# Patient Record
Sex: Female | Born: 1940 | Race: White | Hispanic: No | Marital: Single | Smoking: Never smoker
Health system: Southern US, Community
[De-identification: ages and names within clinical notes are randomized; demographics above are authoritative.]

## PROBLEM LIST (undated history)

## (undated) DIAGNOSIS — E785 Hyperlipidemia, unspecified: Secondary | ICD-10-CM

## (undated) DIAGNOSIS — I10 Essential (primary) hypertension: Secondary | ICD-10-CM

## (undated) DIAGNOSIS — M1990 Unspecified osteoarthritis, unspecified site: Secondary | ICD-10-CM

## (undated) HISTORY — PX: CARPAL TUNNEL RELEASE: SHX101

## (undated) HISTORY — PX: FOOT SURGERY: SHX648

## (undated) HISTORY — DX: Essential (primary) hypertension: I10

## (undated) HISTORY — DX: Hyperlipidemia, unspecified: E78.5

## (undated) HISTORY — DX: Unspecified osteoarthritis, unspecified site: M19.90

---

## 2008-12-31 ENCOUNTER — Ambulatory Visit: Payer: Self-pay | Admitting: Internal Medicine

## 2008-12-31 DIAGNOSIS — E785 Hyperlipidemia, unspecified: Secondary | ICD-10-CM | POA: Insufficient documentation

## 2008-12-31 DIAGNOSIS — I10 Essential (primary) hypertension: Secondary | ICD-10-CM | POA: Insufficient documentation

## 2008-12-31 DIAGNOSIS — J4531 Mild persistent asthma with (acute) exacerbation: Secondary | ICD-10-CM

## 2009-02-02 ENCOUNTER — Ambulatory Visit: Payer: Self-pay | Admitting: Internal Medicine

## 2009-06-03 ENCOUNTER — Ambulatory Visit: Payer: Self-pay | Admitting: Internal Medicine

## 2010-03-03 ENCOUNTER — Ambulatory Visit: Payer: Self-pay | Admitting: Internal Medicine

## 2010-06-07 NOTE — Assessment & Plan Note (Signed)
Summary: rov 4 months///kp   Copy to:  Monica [name] Primary Provider/Referring Provider:  Max Fickle  CC:  4 month followup.  Pt states she is doing great and denies any complaints today.Marland Kitchen  History of Present Illness: History of Present Illness: 12/31/08- 70 yoF seen at kind request of Dr Zachery Dauer in pulmonary sonsultation, concerned about asthma control. Never smoker, dx'd with asthma around 1989. She did well at that time, controlled by [place]. She was aware asthma was getting slowly worse. When Az[place]ort was no longer made, loss of control accelerated. Ventoling HFA didn't help. Advair 250/50 was the best replacement found for Az[place], but makes her hoarse. She has felt more short of breath with exertion and weather changes. Cough  noted, mostly dry, but little wheeze. Denies seasnoal rhinitis and not obviously bothered by foods. cosmetics, animals, grass or dust, aspirin , latex or contrast dye.. Denies hx of pneumnia, GERD, heart problems. Has had pneumovax in past.   02/02/09- Asthma Stable since last here. When she stopped Advair her hoarseness resolved. She is using Qvar, but needs script. CXR- reviewed- mild bronchitis airway thickening consistent with her asthma PFT- reviewed- mild obstruction small airways, with response to dilator.  June 03, 2009- Asthma No significant problems. She declines flu vax but has had pneumovax more than once. Hoarseness resolved off Advair and she thinks Qvar has done well. Hasn't needed her Ventolin rescue inhaler.  Resolved a right low lateral back pain. Denies leg pain or swelling.   Allergies: 1)  ! * Feldine  Past History:  Past Medical History: Last updated: 02/02/2009 Asthma-PFT 02/02/09- mild obst small airways, FEV1 119% Hyperlipidemia Hypertension Arthritis  Past Surgical History: Last updated: 12/31/2008 Carpal tunnel Foot surgery  Family History: Last updated: 12/31/2008 Father Lung Cancer  Social  History: Last updated: 12/31/2008 Retired Widowed with no children, lives alone Never Smoked ETOH none  Review of Systems      See HPI  The patient denies anorexia, fever, weight loss, weight gain, vision loss, decreased hearing, hoarseness, chest pain, syncope, dyspnea on exertion, peripheral edema, prolonged cough, headaches, hemoptysis, abdominal pain, and severe indigestion/heartburn.    Vital Signs:  Patient profile:   70 year old female Weight:      252 pounds O2 Sat:      97 % on Room air Pulse rate:   79 / minute BP sitting:   112 / 70  (left arm) Cuff size:   large  Vitals Entered By: Vernie Murders (June 03, 2009 1:37 PM)  O2 Flow:  Room air  Physical Exam  Additional Exam:  General: A/Ox3; pleasant and cooperative, NAD, obese, talkative SKIN: no rash, lesions NODES: no lymphadenopathy HEENT: [place]/AT, EOM- WNL, Conjuctivae- clear, PERRLA, TM-WNL, Nose- clear, Throat- clear and wnl, Melampatti III NECK: Supple w/ fair ROM, JVD- none, normal carotid impulses w/o bruits Thyroid-  CHEST: Clear to P&A HEART: RRR, no m/g/r heard ABDOMEN: Soft and nl;  QAS:TMHD, nl pulses, no edema  NEURO: Grossly intact to observation      Impression & Recommendations:  Problem # 1:  ASTHMA (ICD-493.90) Doing very well now. We will continue on Qvar with Ventolin rescue used appropriately.  Other Orders: Est. Patient Level III (62229)  Patient Instructions: 1)  Schedule return in one year, earlier if needed  2)  Continue Qvar 40 as your maintenance inhaler, using Ventolin as occasional "rescue"

## 2010-06-07 NOTE — Assessment & Plan Note (Signed)
Summary: rov- per armita-pt requested date//kp   Copy to:  Camillo Flaming Primary Provider/Referring Provider:  Max Fickle  CC:  Follow up visit-asthma; slight wheezing and SOB (feb and July have been the worst month).  History of Present Illness: 02/02/09- Asthma Stable since last here. When she stopped Advair her hoarseness resolved. She is using Qvar, but needs script. CXR- reviewed- mild bronchitis airway thickening consistent with her asthma PFT- reviewed- mild obstruction small airways, with response to dilator.  June 03, 2009- Asthma No significant problems. She declines flu vax but has had pneumovax more than once. Hoarseness resolved off Advair and she thinks Qvar has done well. Hasn't needed her Ventolin rescue inhaler.  Resolved a right low lateral back pain. Denies leg pain or swelling.   March 03, 2010- Asthma Nurse-CC: Follow up visit-asthma; slight wheezing and SOB (feb and July have been the worst month) Noted more wheeze last February and again in July and needed to use her rescue inhaler some then. Rarely needs rescue inhaler otherwise. Uses Qvar twice every day with no problems.  Reluctantly is deciding she should get a flu shot.     Asthma History    Initial Asthma Severity Rating:    Age range: 12+ years    Symptoms: 0-2 days/week    Nighttime Awakenings: 0-2/month    Interferes w/ normal activity: no limitations    SABA use (not for EIB): 0-2 days/week    Asthma Severity Assessment: Intermittent   Preventive Screening-Counseling & Management  Alcohol-Tobacco     Smoking Status: never  Current Medications (verified): 1)  Qvar 40 Mcg/act Aers (Beclomethasone Dipropionate) .... 2 Puffs and Rinse Two Times A Day 2)  Lovastatin 20 Mg Tabs (Lovastatin) .Marland Kitchen.. 1 Once Daily 3)  Sertraline Hcl 50 Mg Tabs (Sertraline Hcl) .Marland Kitchen.. 1 Once Daily 4)  Ventolin Hfa 108 (90 Base) Mcg/act Aers (Albuterol Sulfate) .Marland Kitchen.. 1-2 Puffs As Needed 5)   Hydrochlorothiazide 25 Mg Tabs (Hydrochlorothiazide) .Marland Kitchen.. 1 Once Daily 6)  Tylenol Arthritis Pain 650 Mg Cr-Tabs (Acetaminophen) .Marland Kitchen.. 1 Rablets 2- 3 Times A Day 7)  Spacer For Metered Inhaler- Aerochamber  Allergies (verified): 1)  ! * Feldine  Past History:  Past Medical History: Last updated: 02/02/2009 Asthma-PFT 02/02/09- mild obst small airways, FEV1 119% Hyperlipidemia Hypertension Arthritis  Past Surgical History: Last updated: 12/31/2008 Carpal tunnel Foot surgery  Family History: Last updated: 12/31/2008 Father Lung Cancer  Social History: Last updated: 12/31/2008 Retired Widowed with no children, lives alone Never Smoked ETOH none  Risk Factors: Smoking Status: never (03/03/2010)  Social History: Smoking Status:  never  Review of Systems      See HPI  The patient denies shortness of breath with activity, shortness of breath at rest, productive cough, non-productive cough, coughing up blood, chest pain, irregular heartbeats, acid heartburn, indigestion, loss of appetite, weight change, abdominal pain, difficulty swallowing, sore throat, tooth/dental problems, headaches, nasal congestion/difficulty breathing through nose, sneezing, itching, rash, change in color of mucus, and fever.    Vital Signs:  Patient profile:   70 year old female Height:      64 inches Weight:      247.13 pounds BMI:     42.57 O2 Sat:      93 % on Room air Pulse rate:   71 / minute BP sitting:   132 / 74  (left arm) Cuff size:   large  Vitals Entered By: Reynaldo Minium CMA (March 03, 2010 1:44 PM)  O2 Flow:  Room  air CC: Follow up visit-asthma; slight wheezing and SOB (feb and July have been the worst month)   Physical Exam  Additional Exam:  General: A/Ox3; pleasant and cooperative, NAD, obese, talkative SKIN: no rash, lesions NODES: no lymphadenopathy HEENT: [place]/AT, EOM- WNL, Conjuctivae- clear, PERRLA, TM-WNL, Nose- clear, Throat- clear and wnl, Mallampati III NECK:  Supple w/ fair ROM, JVD- none, normal carotid impulses w/o bruits Thyroid-  CHEST: Clear to P&A HEART: RRR, no m/g/r heard ABDOMEN: overweight UEA:VWUJ, nl pulses, no edema  NEURO: Grossly intact to observation      Impression & Recommendations:  Problem # 1:  ASTHMA (ICD-493.90) Good control with only occasional breakthrough which she manages appropriately with her rescue inhaler.  We discussed flu shot. She has decided to get it this year after all.   Problem # 2:  HYPERTENSION (ICD-401.9) Acceptable pressure range and not using meds likely to exacerbate her asthma. Her updated medication list for this problem includes:    Hydrochlorothiazide 25 Mg Tabs (Hydrochlorothiazide) .Marland Kitchen... 1 once daily  Other Orders: Est. Patient Level III (81191)  Patient Instructions: 1)  Please schedule a follow-up appointment in 1 year. 2)  Please call sooner if needed. 3)  Flu vax  Prevention & Chronic Care Immunizations   Influenza vaccine: Not documented    Tetanus booster: Not documented    Pneumococcal vaccine: Not documented    H. zoster vaccine: Not documented  Colorectal Screening   Hemoccult: Not documented    Colonoscopy: Not documented  Other Screening   Pap smear: Not documented    Mammogram: Not documented    DXA bone density scan: Not documented   Smoking status: never  (03/03/2010)  Lipids   Total Cholesterol: Not documented   LDL: Not documented   LDL Direct: Not documented   HDL: Not documented   Triglycerides: Not documented    SGOT (AST): Not documented   SGPT (ALT): Not documented   Alkaline phosphatase: Not documented   Total bilirubin: Not documented  Hypertension   Last Blood Pressure: 132 / 74  (03/03/2010)   Serum creatinine: Not documented   Serum potassium Not documented  Self-Management Support :    Hypertension self-management support: Not documented    Lipid self-management support: Not documented

## 2011-03-01 ENCOUNTER — Encounter: Payer: Self-pay | Admitting: Pulmonary Disease

## 2011-03-02 ENCOUNTER — Encounter: Payer: Self-pay | Admitting: Internal Medicine

## 2011-03-02 ENCOUNTER — Ambulatory Visit (INDEPENDENT_AMBULATORY_CARE_PROVIDER_SITE_OTHER): Payer: Medicare Other | Admitting: Internal Medicine

## 2011-03-02 VITALS — BP 124/70 | HR 85 | Ht 64.0 in | Wt 243.8 lb

## 2011-03-02 DIAGNOSIS — J45909 Unspecified asthma, uncomplicated: Secondary | ICD-10-CM

## 2011-03-02 MED ORDER — BECLOMETHASONE DIPROPIONATE 40 MCG/ACT IN AERS: 2.0000 | INHALATION_SPRAY | Freq: Two times a day (BID) | RESPIRATORY_TRACT | Status: DC

## 2011-03-02 MED ORDER — ALBUTEROL SULFATE HFA 108 (90 BASE) MCG/ACT IN AERS: 2.0000 | INHALATION_SPRAY | RESPIRATORY_TRACT | Status: DC | PRN

## 2011-03-02 MED ORDER — MONTELUKAST SODIUM 10 MG PO TABS: 10.0000 mg | ORAL_TABLET | Freq: Every day | ORAL | Status: DC

## 2011-03-02 NOTE — Patient Instructions (Signed)
Added script for Singulair/ montelukast sent  Refills for regular meds sent  Flu vax

## 2011-03-02 NOTE — Progress Notes (Signed)
03/02/11- 70 year old female never smoker followed for asthma. Last here 03/03/2010 She says medications are fine. Using an AeroChamber. Wheezing more often and noting more shortness of breath with exertion and during weather change. Using her rescue inhaler a few times a month. Gets hoarse easily with prolonged talking and finding it more difficult to sing. Admits being depressed by her age. Says Advair did not help in the past on limited trial. PFT 02/02/09  ROS-see HPI  Constitutional:   No-   weight loss, night sweats, fevers, chills, fatigue, lassitude. HEENT:   No-  headaches, difficulty swallowing, tooth/dental problems, sore throat,       No-  sneezing, itching, ear ache, nasal congestion, post nasal drip,  CV:  No-   chest pain, orthopnea, PND, swelling in lower extremities, anasarca, dizziness, palpitations Resp: + shortness of breath with exertion or at rest.              No-   productive cough,  No non-productive cough,  No- coughing up of blood.              No-   change in color of mucus.  No- wheezing.   Skin: No-   rash or lesions. GI:  No-   heartburn, indigestion, abdominal pain, nausea, vomiting, diarrhea,                 change in bowel habits, loss of appetite GU: No-   dysuria, change in color of urine, no urgency or frequency.  No- flank pain. MS:  No-   joint pain or swelling.  No- decreased range of motion.  No- back pain. Neuro-     nothing unusual Psych:  No- change in mood or affect. No depression or anxiety.  No memory loss.  OBJ General- Alert, Oriented, Affect-appropriate, Distress- none acute. Very obese but weight is down 4 pounds compared with last visit. Skin- rash-none, lesions- none, excoriation- none Lymphadenopathy- none Head- atraumatic            Eyes- Gross vision intact, PERRLA, conjunctivae clear secretions            Ears- Hearing, canals-normal            Nose- Clear, no-Septal dev, mucus, polyps, erosion, perforation             Throat-  Mallampati III , mucosa clear , drainage- none, tonsils- atrophic Neck- flexible , trachea midline, no stridor , thyroid nl, carotid no bruit Chest - symmetrical excursion , unlabored           Heart/CV- RRR , no murmur , no gallop  , no rub, nl s1 s2                           - JVD- none , edema- none, stasis changes- none, varices- none           Lung- clear to P&A, wheeze- none, cough- none , dullness-none, rub- none           Chest wall-  Abd- tender-no, distended-no, bowel sounds-present, HSM- no Br/ Gen/ Rectal- Not done, not indicated Extrem- cyanosis- none, clubbing, none, atrophy- none, strength- nl Neuro- grossly intact to observation

## 2011-03-05 NOTE — Assessment & Plan Note (Signed)
Dyspnea on exertion would be entirely consistent with her body habitus and deconditioning. We discussed additional protection against asthma and we'll see what effect Singulair has. Flu vaccine.

## 2011-04-04 ENCOUNTER — Encounter: Payer: Self-pay | Admitting: Internal Medicine

## 2011-04-04 ENCOUNTER — Ambulatory Visit (INDEPENDENT_AMBULATORY_CARE_PROVIDER_SITE_OTHER): Payer: Medicare Other | Admitting: Internal Medicine

## 2011-04-04 VITALS — BP 120/64 | HR 80 | Ht 64.0 in | Wt 242.6 lb

## 2011-04-04 DIAGNOSIS — J45909 Unspecified asthma, uncomplicated: Secondary | ICD-10-CM

## 2011-04-04 NOTE — Progress Notes (Signed)
03/02/11- 70 year old female never smoker followed for asthma. Last here 03/03/2010 She says medications are fine. Using an AeroChamber. Wheezing more often and noting more shortness of breath with exertion and during weather change. Using her rescue inhaler a few times a month. Gets hoarse easily with prolonged talking and finding it more difficult to sing. Admits being depressed by her age. Says Advair did not help in the past on limited trial. PFT 02/02/09  04/04/11- 69 year old female never smoker followed for asthma. Has had flu vaccine. Saw no difference adding Singulair. Weather changes are her biggest trigger. Got air purifier for her home. Not needing rescue inhaler at all now. Overall better in the past month. Qvar does help. Recent sore throat has come and gone some over the last 2 weeks without progressing.  ROS-see HPI  Constitutional:   No-   weight loss, night sweats, fevers, chills, fatigue, lassitude. HEENT:   No-  headaches, difficulty swallowing, tooth/dental problems, +sore throat,       No-  sneezing, itching, ear ache, nasal congestion, post nasal drip,  CV:  No-   chest pain, orthopnea, PND, swelling in lower extremities, anasarca, dizziness, palpitations Resp: + shortness of breath with exertion or at rest.              No-   productive cough,  No non-productive cough,  No- coughing up of blood.              No-   change in color of mucus.  No- wheezing.   Skin: No-   rash or lesions. GI:  No-   heartburn, indigestion, abdominal pain, nausea, vomiting, diarrhea,                 change in bowel habits, loss of appetite GU: No-   dysuria, change in color of urine, no urgency or frequency.  No- flank pain. MS:  No-   joint pain or swelling.  No- decreased range of motion.  No- back pain. Neuro-     nothing unusual Psych:  No- change in mood or affect. No depression or anxiety.  No memory loss.  OBJ General- Alert, Oriented, Affect-appropriate, Distress- none acute.  Obese Skin- rash-none, lesions- none, excoriation- none Lymphadenopathy- none Head- atraumatic            Eyes- Gross vision intact, PERRLA, conjunctivae clear secretions            Ears- Hearing, canals-normal            Nose- Clear, no-Septal dev, mucus, polyps, erosion, perforation             Throat- Mallampati III , mucosa clear , drainage- none, tonsils- atrophic. Easy gag. Neck- flexible , trachea midline, no stridor , thyroid nl, carotid no bruit Chest - symmetrical excursion , unlabored           Heart/CV- RRR , no murmur , no gallop  , no rub, nl s1 s2                           - JVD- none , edema- none, stasis changes- none, varices- none           Lung- clear to P&A, wheeze- none, cough- none , dullness-none, rub- none           Chest wall-  Abd- tender-no, distended-no, bowel sounds-present, HSM- no Br/ Gen/ Rectal- Not done, not indicated Extrem- cyanosis- none, clubbing, none, atrophy-  none, strength- nl Neuro- grossly intact to observation

## 2011-04-04 NOTE — Patient Instructions (Addendum)
Ok now to stop Singulair. We can revisit later if needed.  Please call as needed

## 2011-04-06 NOTE — Assessment & Plan Note (Signed)
Asthma is under good control without Singulair. Sore throat is minimal and nonspecific with nothing really seen on exam. We discussed her medications and expectations for winter.

## 2011-06-07 DIAGNOSIS — E7800 Pure hypercholesterolemia, unspecified: Secondary | ICD-10-CM | POA: Diagnosis not present

## 2011-06-07 DIAGNOSIS — F329 Major depressive disorder, single episode, unspecified: Secondary | ICD-10-CM | POA: Diagnosis not present

## 2011-06-07 DIAGNOSIS — Z79899 Other long term (current) drug therapy: Secondary | ICD-10-CM | POA: Diagnosis not present

## 2011-06-07 DIAGNOSIS — I10 Essential (primary) hypertension: Secondary | ICD-10-CM | POA: Diagnosis not present

## 2011-06-07 DIAGNOSIS — R7301 Impaired fasting glucose: Secondary | ICD-10-CM | POA: Diagnosis not present

## 2011-08-17 DIAGNOSIS — H04129 Dry eye syndrome of unspecified lacrimal gland: Secondary | ICD-10-CM | POA: Diagnosis not present

## 2011-08-17 DIAGNOSIS — H2510 Age-related nuclear cataract, unspecified eye: Secondary | ICD-10-CM | POA: Diagnosis not present

## 2011-08-17 DIAGNOSIS — H52209 Unspecified astigmatism, unspecified eye: Secondary | ICD-10-CM | POA: Diagnosis not present

## 2011-08-17 DIAGNOSIS — H01009 Unspecified blepharitis unspecified eye, unspecified eyelid: Secondary | ICD-10-CM | POA: Diagnosis not present

## 2011-09-26 ENCOUNTER — Ambulatory Visit: Payer: Medicare Other | Admitting: Internal Medicine

## 2011-09-28 ENCOUNTER — Ambulatory Visit (INDEPENDENT_AMBULATORY_CARE_PROVIDER_SITE_OTHER): Payer: Medicare Other | Admitting: Internal Medicine

## 2011-09-28 ENCOUNTER — Encounter: Payer: Self-pay | Admitting: Internal Medicine

## 2011-09-28 VITALS — BP 136/82 | HR 95 | Ht 64.0 in | Wt 237.6 lb

## 2011-09-28 DIAGNOSIS — J45998 Other asthma: Secondary | ICD-10-CM

## 2011-09-28 DIAGNOSIS — J45909 Unspecified asthma, uncomplicated: Secondary | ICD-10-CM | POA: Diagnosis not present

## 2011-09-28 DIAGNOSIS — J302 Other seasonal allergic rhinitis: Secondary | ICD-10-CM

## 2011-09-28 DIAGNOSIS — J309 Allergic rhinitis, unspecified: Secondary | ICD-10-CM | POA: Diagnosis not present

## 2011-09-28 NOTE — Progress Notes (Signed)
03/02/11- 71 year old female never smoker followed for asthma. Last here 03/03/2010 She says medications are fine. Using an AeroChamber. Wheezing more often and noting more shortness of breath with exertion and during weather change. Using her rescue inhaler a few times a month. Gets hoarse easily with prolonged talking and finding it more difficult to sing. Admits being depressed by her age. Says Advair did not help in the past on limited trial. PFT 02/02/09  04/04/11- 71 year old female never smoker followed for asthma. Has had flu vaccine. Saw no difference adding Singulair. Weather changes are her biggest trigger. Got air purifier for her home. Not needing rescue inhaler at all now. Overall better in the past month. Qvar does help. Recent sore throat has come and gone some over the last 2 weeks without progressing.  09/28/11- 71 year old female never smoker followed for asthma. PCP Dr Gardiner Ramus  Denies any SOB, wheezing, cough, or congestion at this time She had a good winter after her last visit here. 2 the spring has had minor sneezing blamed on pollen. She is very pleased and considers her breathing to do well. Rarely needs a rescue inhaler. Continues Qvar 40 twice daily as instructed. She did not want to try reducing the dose for fear of aggravating her condition.  ROS-see HPI  Constitutional:   No-   weight loss, night sweats, fevers, chills, fatigue, lassitude. HEENT:   No-  headaches, difficulty swallowing, tooth/dental problems, +sore throat,       No-  sneezing, itching, ear ache, nasal congestion, post nasal drip,  CV:  No-   chest pain, orthopnea, PND, swelling in lower extremities, anasarca, dizziness, palpitations Resp:   Little shortness of breath with exertion or at rest.              No-   productive cough,  No non-productive cough,  No- coughing up of blood.              No-   change in color of mucus.  No- wheezing.   Skin: No-   rash or lesions. GI:  No-   heartburn,  indigestion, abdominal pain, nausea, vomiting,  GU:  MS:  No-   joint pain or swelling.   Neuro-     nothing unusual Psych:  No- change in mood or affect. No depression or anxiety.  No memory loss.  OBJ General- Alert, Oriented, Affect-appropriate, Distress- none acute. Obese Skin- rash-none, lesions- none, excoriation- none Lymphadenopathy- none Head- atraumatic            Eyes- Gross vision intact, PERRLA, conjunctivae clear secretions            Ears- Hearing, canals-normal            Nose- Clear, no-Septal dev, mucus, polyps, erosion, perforation             Throat- Mallampati III , mucosa clear , drainage- none, tonsils- atrophic. Easy gag. Neck- flexible , trachea midline, no stridor , thyroid nl, carotid no bruit Chest - symmetrical excursion , unlabored           Heart/CV- RRR , no murmur , no gallop  , no rub, nl s1 s2                           - JVD- none , edema- none, stasis changes- none, varices- none           Lung- clear to P&A, wheeze- none, cough- none ,  dullness-none, rub- none           Chest wall-  Abd-  Br/ Gen/ Rectal- Not done, not indicated Extrem- cyanosis- none, clubbing, none, atrophy- none, strength- nl Neuro- grossly intact to observation

## 2011-09-28 NOTE — Patient Instructions (Signed)
Please call as needed. I'm glad you have been doing so well.

## 2011-10-03 DIAGNOSIS — J302 Other seasonal allergic rhinitis: Secondary | ICD-10-CM | POA: Insufficient documentation

## 2011-10-03 NOTE — Assessment & Plan Note (Signed)
Very good control with maintenance Qvar. She is reluctant to risk exacerbation by trying to reduce this dose.

## 2011-10-03 NOTE — Assessment & Plan Note (Signed)
Plan-options and common triggers were discussed. She will use an OTC antihistamine initially and we can reevaluate her in the future.

## 2011-11-02 DIAGNOSIS — I10 Essential (primary) hypertension: Secondary | ICD-10-CM | POA: Diagnosis not present

## 2011-11-02 DIAGNOSIS — E7800 Pure hypercholesterolemia, unspecified: Secondary | ICD-10-CM | POA: Diagnosis not present

## 2011-11-02 DIAGNOSIS — F329 Major depressive disorder, single episode, unspecified: Secondary | ICD-10-CM | POA: Diagnosis not present

## 2011-11-02 DIAGNOSIS — R7301 Impaired fasting glucose: Secondary | ICD-10-CM | POA: Diagnosis not present

## 2011-11-02 DIAGNOSIS — Z79899 Other long term (current) drug therapy: Secondary | ICD-10-CM | POA: Diagnosis not present

## 2012-02-29 DIAGNOSIS — Z23 Encounter for immunization: Secondary | ICD-10-CM | POA: Diagnosis not present

## 2012-03-11 ENCOUNTER — Other Ambulatory Visit: Payer: Self-pay | Admitting: Internal Medicine

## 2012-03-26 ENCOUNTER — Encounter: Payer: Self-pay | Admitting: Internal Medicine

## 2012-03-26 ENCOUNTER — Ambulatory Visit (INDEPENDENT_AMBULATORY_CARE_PROVIDER_SITE_OTHER): Payer: Medicare Other | Admitting: Internal Medicine

## 2012-03-26 VITALS — BP 130/80 | HR 79 | Ht 64.0 in | Wt 238.6 lb

## 2012-03-26 DIAGNOSIS — J309 Allergic rhinitis, unspecified: Secondary | ICD-10-CM

## 2012-03-26 DIAGNOSIS — J45998 Other asthma: Secondary | ICD-10-CM

## 2012-03-26 DIAGNOSIS — J45909 Unspecified asthma, uncomplicated: Secondary | ICD-10-CM | POA: Diagnosis not present

## 2012-03-26 DIAGNOSIS — J302 Other seasonal allergic rhinitis: Secondary | ICD-10-CM

## 2012-03-26 NOTE — Patient Instructions (Addendum)
Please call as needed 

## 2012-03-26 NOTE — Progress Notes (Signed)
03/02/11- 71 year old female never smoker followed for asthma. Last here 03/03/2010 She says medications are fine. Using an AeroChamber. Wheezing more often and noting more shortness of breath with exertion and during weather change. Using her rescue inhaler a few times a month. Gets hoarse easily with prolonged talking and finding it more difficult to sing. Admits being depressed by her age. Says Advair did not help in the past on limited trial. PFT 02/02/09  04/04/11- 71 year old female never smoker followed for asthma. Has had flu vaccine. Saw no difference adding Singulair. Weather changes are her biggest trigger. Got air purifier for her home. Not needing rescue inhaler at all now. Overall better in the past month. Qvar does help. Recent sore throat has come and gone some over the last 2 weeks without progressing.  09/28/11- 71 year old female never smoker followed for asthma. PCP Dr Gardiner Ramus  Denies any SOB, wheezing, cough, or congestion at this time She had a good winter after her last visit here.  Through the spring has had minor sneezing blamed on pollen. She is very pleased and considers her breathing to do well. Rarely needs a rescue inhaler. Continues Qvar 40 twice daily as instructed. She did not want to try reducing the dose for fear of aggravating her condition.  03/26/12- 71 year old female never smoker followed for asthma, allergic rhinitis. PCP Dr Gardiner Ramus FOLLOWS FOR: patient reports breathing is doing well, denies any other concerns at this time  Had flu vaccine. Maintenance therapy is Qvar 40 with rare need for rescue inhaler. She is satisfied. CXR 12/31/08-reviewed IMPRESSION:  Mild bronchitic changes.  Provider: Darrelyn Hillock   ROS-see HPI  Constitutional:   No-   weight loss, night sweats, fevers, chills, fatigue, lassitude. HEENT:   No-  headaches, difficulty swallowing, tooth/dental problems, +sore throat,       No-  sneezing, itching, ear ache, nasal  congestion, post nasal drip,  CV:  No-   chest pain, orthopnea, PND, swelling in lower extremities, anasarca, dizziness, palpitations Resp:   No- shortness of breath with exertion or at rest.              No-   productive cough,  No non-productive cough,  No- coughing up of blood.              No-   change in color of mucus.  No- wheezing.   Skin: No-   rash or lesions. GI:  No-   heartburn, indigestion, abdominal pain, nausea, vomiting,  GU:  MS:  No-   joint pain or swelling.   Neuro-     nothing unusual Psych:  No- change in mood or affect. No depression or anxiety.  No memory loss.  OBJ General- Alert, Oriented, Affect-appropriate, Distress- none acute. Obese Skin- rash-none, lesions- none, excoriation- none Lymphadenopathy- none Head- atraumatic            Eyes- Gross vision intact, PERRLA, conjunctivae clear secretions            Ears- Hearing, canals-normal            Nose- Clear, no-Septal dev, mucus, polyps, erosion, perforation             Throat- Mallampati III , mucosa clear , drainage- none, tonsils- atrophic. Easy gag. Neck- flexible , trachea midline, no stridor , thyroid nl, carotid no bruit Chest - symmetrical excursion , unlabored           Heart/CV- RRR , no murmur , no gallop  ,  no rub, nl s1 s2                           - JVD- none , edema- none, stasis changes- none, varices- none           Lung- clear to P&A, wheeze- none, cough- none , dullness-none, rub- none           Chest wall-  Abd-  Br/ Gen/ Rectal- Not done, not indicated Extrem- cyanosis- none, clubbing, none, atrophy- none, strength- nl Neuro- grossly intact to observation

## 2012-04-05 NOTE — Assessment & Plan Note (Signed)
Very good control. She is compliant with maintenance steroid inhaler therapy. We discussed long-term use.

## 2012-04-05 NOTE — Assessment & Plan Note (Signed)
Not bothered currently by indoor heat or fall season. Management is active. We will see what happens as spring comes.

## 2012-04-24 DIAGNOSIS — R7301 Impaired fasting glucose: Secondary | ICD-10-CM | POA: Diagnosis not present

## 2012-04-24 DIAGNOSIS — Z1331 Encounter for screening for depression: Secondary | ICD-10-CM | POA: Diagnosis not present

## 2012-04-24 DIAGNOSIS — M79609 Pain in unspecified limb: Secondary | ICD-10-CM | POA: Diagnosis not present

## 2012-04-24 DIAGNOSIS — I10 Essential (primary) hypertension: Secondary | ICD-10-CM | POA: Diagnosis not present

## 2012-04-24 DIAGNOSIS — Z23 Encounter for immunization: Secondary | ICD-10-CM | POA: Diagnosis not present

## 2012-04-24 DIAGNOSIS — Z79899 Other long term (current) drug therapy: Secondary | ICD-10-CM | POA: Diagnosis not present

## 2012-04-24 DIAGNOSIS — E7800 Pure hypercholesterolemia, unspecified: Secondary | ICD-10-CM | POA: Diagnosis not present

## 2012-04-24 DIAGNOSIS — F329 Major depressive disorder, single episode, unspecified: Secondary | ICD-10-CM | POA: Diagnosis not present

## 2012-08-20 DIAGNOSIS — H04129 Dry eye syndrome of unspecified lacrimal gland: Secondary | ICD-10-CM | POA: Diagnosis not present

## 2012-08-20 DIAGNOSIS — H2510 Age-related nuclear cataract, unspecified eye: Secondary | ICD-10-CM | POA: Diagnosis not present

## 2012-08-20 DIAGNOSIS — H01009 Unspecified blepharitis unspecified eye, unspecified eyelid: Secondary | ICD-10-CM | POA: Diagnosis not present

## 2012-08-20 DIAGNOSIS — H524 Presbyopia: Secondary | ICD-10-CM | POA: Diagnosis not present

## 2012-10-16 DIAGNOSIS — F411 Generalized anxiety disorder: Secondary | ICD-10-CM | POA: Diagnosis not present

## 2012-10-16 DIAGNOSIS — F4321 Adjustment disorder with depressed mood: Secondary | ICD-10-CM | POA: Diagnosis not present

## 2012-10-16 DIAGNOSIS — F329 Major depressive disorder, single episode, unspecified: Secondary | ICD-10-CM | POA: Diagnosis not present

## 2012-10-16 DIAGNOSIS — Z79899 Other long term (current) drug therapy: Secondary | ICD-10-CM | POA: Diagnosis not present

## 2012-10-16 DIAGNOSIS — I10 Essential (primary) hypertension: Secondary | ICD-10-CM | POA: Diagnosis not present

## 2012-10-16 DIAGNOSIS — R7301 Impaired fasting glucose: Secondary | ICD-10-CM | POA: Diagnosis not present

## 2012-10-16 DIAGNOSIS — E7800 Pure hypercholesterolemia, unspecified: Secondary | ICD-10-CM | POA: Diagnosis not present

## 2012-10-16 DIAGNOSIS — R7989 Other specified abnormal findings of blood chemistry: Secondary | ICD-10-CM | POA: Diagnosis not present

## 2013-02-25 ENCOUNTER — Ambulatory Visit (INDEPENDENT_AMBULATORY_CARE_PROVIDER_SITE_OTHER): Payer: Medicare Other | Admitting: Internal Medicine

## 2013-02-25 ENCOUNTER — Encounter: Payer: Self-pay | Admitting: Internal Medicine

## 2013-02-25 VITALS — BP 130/80 | HR 79 | Ht 64.0 in | Wt 244.0 lb

## 2013-02-25 DIAGNOSIS — J45909 Unspecified asthma, uncomplicated: Secondary | ICD-10-CM

## 2013-02-25 DIAGNOSIS — Z23 Encounter for immunization: Secondary | ICD-10-CM | POA: Diagnosis not present

## 2013-02-25 DIAGNOSIS — J45998 Other asthma: Secondary | ICD-10-CM

## 2013-02-25 NOTE — Patient Instructions (Addendum)
Flu vax- standard  We can continue present meds. Please call for refills or problems as needed  Pneumococcal conjugate 13 vax

## 2013-02-25 NOTE — Progress Notes (Signed)
03/02/11- 72 year old female never smoker followed for asthma. Last here 03/03/2010 She says medications are fine. Using an AeroChamber. Wheezing more often and noting more shortness of breath with exertion and during weather change. Using her rescue inhaler a few times a month. Gets hoarse easily with prolonged talking and finding it more difficult to sing. Admits being depressed by her age. Says Advair did not help in the past on limited trial. PFT 02/02/09  04/04/11- 72 year old female never smoker followed for asthma. Has had flu vaccine. Saw no difference adding Singulair. Weather changes are her biggest trigger. Got air purifier for her home. Not needing rescue inhaler at all now. Overall better in the past month. Qvar does help. Recent sore throat has come and gone some over the last 2 weeks without progressing.  09/28/11- 72 year old female never smoker followed for asthma. PCP Dr Gardiner Ramus  Denies any SOB, wheezing, cough, or congestion at this time She had a good winter after her last visit here.  Through the spring has had minor sneezing blamed on pollen. She is very pleased and considers her breathing to do well. Rarely needs a rescue inhaler. Continues Qvar 40 twice daily as instructed. She did not want to try reducing the dose for fear of aggravating her condition.  03/26/12- 72 year old female never smoker followed for asthma, allergic rhinitis. PCP Dr Gardiner Ramus FOLLOWS FOR: patient reports breathing is doing well, denies any other concerns at this time  Had flu vaccine. Maintenance therapy is Qvar 40 with rare need for rescue inhaler. She is satisfied. CXR 12/31/08-reviewed IMPRESSION:  Mild bronchitic changes.  Provider: Darrelyn Hillock   02/25/13- 72 year old female never smoker followed for asthma, allergic rhinitis. PCP Dr Gardiner Ramus FOLLOWS FOR:  Breathing is well, some SOB w/ exertion.  Denies any concerns today Dyspnea only if she hurries. Infrequent wheeze. Morning  cough. Qvar usually sufficient.   ROS-see HPI  Constitutional:   No-   weight loss, night sweats, fevers, chills, fatigue, lassitude. HEENT:   No-  headaches, difficulty swallowing, tooth/dental problems, sore throat,       No-  sneezing, itching, ear ache, nasal congestion, post nasal drip,  CV:  No-   chest pain, orthopnea, PND, swelling in lower extremities, anasarca, dizziness, palpitations Resp:   No- shortness of breath with exertion or at rest.              No-   productive cough,  + non-productive cough,  No- coughing up of blood.              No-   change in color of mucus.  +rare wheezing.   Skin: No-   rash or lesions. GI:  No-   heartburn, indigestion, abdominal pain, nausea, vomiting,  GU:  MS:  No-   joint pain or swelling.   Neuro-     nothing unusual Psych:  No- change in mood or affect. No depression or anxiety.  No memory loss.  OBJ General- Alert, Oriented, Affect-appropriate, Distress- none acute. Obese Skin- rash-none, lesions- none, excoriation- none Lymphadenopathy- none Head- atraumatic            Eyes- Gross vision intact, PERRLA, conjunctivae clear secretions            Ears- Hearing, canals-normal            Nose- Clear, no-Septal dev, mucus, polyps, erosion, perforation             Throat- Mallampati III , mucosa clear , drainage-  none, tonsils- atrophic. +Easy gag. Neck- flexible , trachea midline, no stridor , thyroid nl, carotid no bruit Chest - symmetrical excursion , unlabored           Heart/CV- RRR , no murmur , no gallop  , no rub, nl s1 s2                           - JVD- none , edema- none, stasis changes- none, varices- none           Lung- clear to P&A, wheeze- none, cough- none , dullness-none, rub- none           Chest wall-  Abd-  Br/ Gen/ Rectal- Not done, not indicated Extrem- cyanosis- none, clubbing, none, atrophy- none, strength- nl Neuro- grossly intact to observation

## 2013-03-14 NOTE — Assessment & Plan Note (Addendum)
Good control. Discussed vaccines Decision to give both flu and Prevnar pneumococcal conj 13

## 2013-04-08 ENCOUNTER — Other Ambulatory Visit: Payer: Self-pay | Admitting: Internal Medicine

## 2013-04-08 ENCOUNTER — Other Ambulatory Visit: Payer: Self-pay | Admitting: *Deleted

## 2013-04-08 MED ORDER — BECLOMETHASONE DIPROPIONATE 40 MCG/ACT IN AERS: INHALATION_SPRAY | RESPIRATORY_TRACT | Status: DC

## 2013-04-08 NOTE — Telephone Encounter (Signed)
rx was sent to [facility] with Dr. Lynelle Doctor name by accident. I called [facility], spoke with Trip.  Gave VO to change prescribing prescriber to Jetty Duhamel, MD. Trip verbalized understanding and voiced no further questions or concerns at this time.

## 2013-04-17 DIAGNOSIS — Z79899 Other long term (current) drug therapy: Secondary | ICD-10-CM | POA: Diagnosis not present

## 2013-04-17 DIAGNOSIS — E7800 Pure hypercholesterolemia, unspecified: Secondary | ICD-10-CM | POA: Diagnosis not present

## 2013-04-17 DIAGNOSIS — R7301 Impaired fasting glucose: Secondary | ICD-10-CM | POA: Diagnosis not present

## 2013-04-17 DIAGNOSIS — F329 Major depressive disorder, single episode, unspecified: Secondary | ICD-10-CM | POA: Diagnosis not present

## 2013-04-17 DIAGNOSIS — I10 Essential (primary) hypertension: Secondary | ICD-10-CM | POA: Diagnosis not present

## 2013-07-19 ENCOUNTER — Other Ambulatory Visit: Payer: Self-pay | Admitting: Internal Medicine

## 2013-08-13 DIAGNOSIS — Z1231 Encounter for screening mammogram for malignant neoplasm of breast: Secondary | ICD-10-CM | POA: Diagnosis not present

## 2013-08-22 DIAGNOSIS — H5200 Hypermetropia, unspecified eye: Secondary | ICD-10-CM | POA: Diagnosis not present

## 2013-08-22 DIAGNOSIS — H25049 Posterior subcapsular polar age-related cataract, unspecified eye: Secondary | ICD-10-CM | POA: Diagnosis not present

## 2013-08-22 DIAGNOSIS — H524 Presbyopia: Secondary | ICD-10-CM | POA: Diagnosis not present

## 2013-08-22 DIAGNOSIS — H2510 Age-related nuclear cataract, unspecified eye: Secondary | ICD-10-CM | POA: Diagnosis not present

## 2013-10-22 DIAGNOSIS — I10 Essential (primary) hypertension: Secondary | ICD-10-CM | POA: Diagnosis not present

## 2013-10-22 DIAGNOSIS — F329 Major depressive disorder, single episode, unspecified: Secondary | ICD-10-CM | POA: Diagnosis not present

## 2013-10-22 DIAGNOSIS — R7301 Impaired fasting glucose: Secondary | ICD-10-CM | POA: Diagnosis not present

## 2013-10-22 DIAGNOSIS — Z79899 Other long term (current) drug therapy: Secondary | ICD-10-CM | POA: Diagnosis not present

## 2013-10-22 DIAGNOSIS — Z1331 Encounter for screening for depression: Secondary | ICD-10-CM | POA: Diagnosis not present

## 2013-10-22 DIAGNOSIS — E7800 Pure hypercholesterolemia, unspecified: Secondary | ICD-10-CM | POA: Diagnosis not present

## 2013-11-03 ENCOUNTER — Other Ambulatory Visit: Payer: Self-pay | Admitting: Internal Medicine

## 2014-02-23 ENCOUNTER — Encounter: Payer: Self-pay | Admitting: Internal Medicine

## 2014-02-23 ENCOUNTER — Ambulatory Visit: Payer: Medicare Other | Admitting: Internal Medicine

## 2014-02-23 VITALS — BP 162/98 | HR 72 | Ht 64.0 in | Wt 246.6 lb

## 2014-02-23 DIAGNOSIS — Z23 Encounter for immunization: Secondary | ICD-10-CM

## 2014-02-23 NOTE — Progress Notes (Signed)
03/02/11- 73 year old female never smoker followed for asthma. Last here 03/03/2010 She says medications are fine. Using an AeroChamber. Wheezing more often and noting more shortness of breath with exertion and during weather change. Using her rescue inhaler a few times a month. Gets hoarse easily with prolonged talking and finding it more difficult to sing. Admits being depressed by her age. Says Advair did not help in the past on limited trial. PFT 02/02/09  04/04/11- 73 year old female never smoker followed for asthma. Has had flu vaccine. Saw no difference adding Singulair. Weather changes are her biggest trigger. Got air purifier for her home. Not needing rescue inhaler at all now. Overall better in the past month. Qvar does help. Recent sore throat has come and gone some over the last 2 weeks without progressing.  09/28/11- 73 year old female never smoker followed for asthma. PCP Dr Gardiner Ramus  Denies any SOB, wheezing, cough, or congestion at this time She had a good winter after her last visit here.  Through the spring has had minor sneezing blamed on pollen. She is very pleased and considers her breathing to do well. Rarely needs a rescue inhaler. Continues Qvar 40 twice daily as instructed. She did not want to try reducing the dose for fear of aggravating her condition.  03/26/12- 73 year old female never smoker followed for asthma, allergic rhinitis. PCP Dr Gardiner Ramus FOLLOWS FOR: patient reports breathing is doing well, denies any other concerns at this time  Had flu vaccine. Maintenance therapy is Qvar 40 with rare need for rescue inhaler. She is satisfied. CXR 12/31/08-reviewed IMPRESSION:  Mild bronchitic changes.  Provider: Darrelyn Hillock   02/25/13- 73 year old female never smoker followed for asthma, allergic rhinitis. PCP Dr Gardiner Ramus FOLLOWS FOR:  Breathing is well, some SOB w/ exertion.  Denies any concerns today Dyspnea only if she hurries. Infrequent wheeze. Morning  cough. Qvar usually sufficient.   02/23/14- 73 year old female never smoker followed for asthma, allergic rhinitis. PCP Dr Gardiner Ramus FOLLOW FOR:  yearly OV; no complaints    ROS-see HPI  Constitutional:   No-   weight loss, night sweats, fevers, chills, fatigue, lassitude. HEENT:   No-  headaches, difficulty swallowing, tooth/dental problems, sore throat,       No-  sneezing, itching, ear ache, nasal congestion, post nasal drip,  CV:  No-   chest pain, orthopnea, PND, swelling in lower extremities, anasarca, dizziness, palpitations Resp:   No- shortness of breath with exertion or at rest.              No-   productive cough,  + non-productive cough,  No- coughing up of blood.              No-   change in color of mucus.  +rare wheezing.   Skin: No-   rash or lesions. GI:  No-   heartburn, indigestion, abdominal pain, nausea, vomiting,  GU:  MS:  No-   joint pain or swelling.   Neuro-     nothing unusual Psych:  No- change in mood or affect. No depression or anxiety.  No memory loss.  OBJ General- Alert, Oriented, Affect-appropriate, Distress- none acute. Obese Skin- rash-none, lesions- none, excoriation- none Lymphadenopathy- none Head- atraumatic            Eyes- Gross vision intact, PERRLA, conjunctivae clear secretions            Ears- Hearing, canals-normal            Nose- Clear, no-Septal dev,  mucus, polyps, erosion, perforation             Throat- Mallampati III , mucosa clear , drainage- none, tonsils- atrophic. +Easy gag. Neck- flexible , trachea midline, no stridor , thyroid nl, carotid no bruit Chest - symmetrical excursion , unlabored           Heart/CV- RRR , no murmur , no gallop  , no rub, nl s1 s2                           - JVD- none , edema- none, stasis changes- none, varices- none           Lung- clear to P&A, wheeze- none, cough- none , dullness-none, rub- none           Chest wall-  Abd-  Br/ Gen/ Rectal- Not done, not indicated Extrem- cyanosis- none,  clubbing, none, atrophy- none, strength- nl Neuro- grossly intact to observation

## 2014-02-23 NOTE — Patient Instructions (Signed)
Flu vax  We can refill your inhalers when needed

## 2014-03-05 ENCOUNTER — Other Ambulatory Visit: Payer: Self-pay | Admitting: Internal Medicine

## 2014-04-03 DIAGNOSIS — M654 Radial styloid tenosynovitis [de Quervain]: Secondary | ICD-10-CM | POA: Diagnosis not present

## 2014-04-03 DIAGNOSIS — M25532 Pain in left wrist: Secondary | ICD-10-CM | POA: Diagnosis not present

## 2014-04-15 DIAGNOSIS — F329 Major depressive disorder, single episode, unspecified: Secondary | ICD-10-CM | POA: Diagnosis not present

## 2014-04-15 DIAGNOSIS — E669 Obesity, unspecified: Secondary | ICD-10-CM | POA: Diagnosis not present

## 2014-04-15 DIAGNOSIS — E780 Pure hypercholesterolemia: Secondary | ICD-10-CM | POA: Diagnosis not present

## 2014-04-15 DIAGNOSIS — M778 Other enthesopathies, not elsewhere classified: Secondary | ICD-10-CM | POA: Diagnosis not present

## 2014-04-15 DIAGNOSIS — I10 Essential (primary) hypertension: Secondary | ICD-10-CM | POA: Diagnosis not present

## 2014-04-15 DIAGNOSIS — Z79899 Other long term (current) drug therapy: Secondary | ICD-10-CM | POA: Diagnosis not present

## 2014-04-15 DIAGNOSIS — R7301 Impaired fasting glucose: Secondary | ICD-10-CM | POA: Diagnosis not present

## 2014-05-08 DIAGNOSIS — L03113 Cellulitis of right upper limb: Secondary | ICD-10-CM | POA: Diagnosis not present

## 2014-05-20 DIAGNOSIS — M719 Bursopathy, unspecified: Secondary | ICD-10-CM | POA: Diagnosis not present

## 2014-05-20 DIAGNOSIS — M71011 Abscess of bursa, right shoulder: Secondary | ICD-10-CM | POA: Diagnosis not present

## 2014-05-20 DIAGNOSIS — M810 Age-related osteoporosis without current pathological fracture: Secondary | ICD-10-CM | POA: Diagnosis not present

## 2014-05-20 DIAGNOSIS — Z79899 Other long term (current) drug therapy: Secondary | ICD-10-CM | POA: Diagnosis not present

## 2014-05-20 DIAGNOSIS — M25511 Pain in right shoulder: Secondary | ICD-10-CM | POA: Diagnosis not present

## 2014-05-20 DIAGNOSIS — M25532 Pain in left wrist: Secondary | ICD-10-CM | POA: Diagnosis not present

## 2014-05-27 DIAGNOSIS — M069 Rheumatoid arthritis, unspecified: Secondary | ICD-10-CM | POA: Diagnosis not present

## 2014-06-25 DIAGNOSIS — M25562 Pain in left knee: Secondary | ICD-10-CM | POA: Diagnosis not present

## 2014-06-25 DIAGNOSIS — M25511 Pain in right shoulder: Secondary | ICD-10-CM | POA: Diagnosis not present

## 2014-06-25 DIAGNOSIS — M25571 Pain in right ankle and joints of right foot: Secondary | ICD-10-CM | POA: Diagnosis not present

## 2014-06-25 DIAGNOSIS — M25561 Pain in right knee: Secondary | ICD-10-CM | POA: Diagnosis not present

## 2014-06-25 DIAGNOSIS — M0579 Rheumatoid arthritis with rheumatoid factor of multiple sites without organ or systems involvement: Secondary | ICD-10-CM | POA: Diagnosis not present

## 2014-06-25 DIAGNOSIS — M79641 Pain in right hand: Secondary | ICD-10-CM | POA: Diagnosis not present

## 2014-06-25 DIAGNOSIS — M25572 Pain in left ankle and joints of left foot: Secondary | ICD-10-CM | POA: Diagnosis not present

## 2014-07-24 DIAGNOSIS — R5381 Other malaise: Secondary | ICD-10-CM | POA: Diagnosis not present

## 2014-07-24 DIAGNOSIS — Z79899 Other long term (current) drug therapy: Secondary | ICD-10-CM | POA: Diagnosis not present

## 2014-07-24 DIAGNOSIS — Z111 Encounter for screening for respiratory tuberculosis: Secondary | ICD-10-CM | POA: Diagnosis not present

## 2014-07-24 DIAGNOSIS — M2550 Pain in unspecified joint: Secondary | ICD-10-CM | POA: Diagnosis not present

## 2014-07-24 DIAGNOSIS — E559 Vitamin D deficiency, unspecified: Secondary | ICD-10-CM | POA: Diagnosis not present

## 2014-07-24 DIAGNOSIS — R300 Dysuria: Secondary | ICD-10-CM | POA: Diagnosis not present

## 2014-08-25 DIAGNOSIS — Z79899 Other long term (current) drug therapy: Secondary | ICD-10-CM | POA: Diagnosis not present

## 2014-08-25 DIAGNOSIS — H43813 Vitreous degeneration, bilateral: Secondary | ICD-10-CM | POA: Diagnosis not present

## 2014-08-25 DIAGNOSIS — H2513 Age-related nuclear cataract, bilateral: Secondary | ICD-10-CM | POA: Diagnosis not present

## 2014-08-25 DIAGNOSIS — H3342 Traction detachment of retina, left eye: Secondary | ICD-10-CM | POA: Diagnosis not present

## 2014-09-03 DIAGNOSIS — H3342 Traction detachment of retina, left eye: Secondary | ICD-10-CM | POA: Diagnosis not present

## 2014-09-24 DIAGNOSIS — M170 Bilateral primary osteoarthritis of knee: Secondary | ICD-10-CM | POA: Diagnosis not present

## 2014-09-24 DIAGNOSIS — M79641 Pain in right hand: Secondary | ICD-10-CM | POA: Diagnosis not present

## 2014-09-25 ENCOUNTER — Other Ambulatory Visit (HOSPITAL_COMMUNITY): Payer: Self-pay | Admitting: Rheumatology

## 2014-09-25 ENCOUNTER — Ambulatory Visit (HOSPITAL_COMMUNITY)
Admission: RE | Admit: 2014-09-25 | Discharge: 2014-09-25 | Disposition: A | Discharge status: Home / Self Care | Payer: Medicare Other | Source: Ambulatory Visit | Attending: Rheumatology | Admitting: Rheumatology

## 2014-09-25 DIAGNOSIS — M069 Rheumatoid arthritis, unspecified: Secondary | ICD-10-CM | POA: Insufficient documentation

## 2014-09-25 DIAGNOSIS — Z9225 Personal history of immunosupression therapy: Secondary | ICD-10-CM | POA: Insufficient documentation

## 2014-10-06 DIAGNOSIS — Z79899 Other long term (current) drug therapy: Secondary | ICD-10-CM | POA: Diagnosis not present

## 2014-10-16 DIAGNOSIS — F329 Major depressive disorder, single episode, unspecified: Secondary | ICD-10-CM | POA: Diagnosis not present

## 2014-10-16 DIAGNOSIS — R7301 Impaired fasting glucose: Secondary | ICD-10-CM | POA: Diagnosis not present

## 2014-10-16 DIAGNOSIS — Z1389 Encounter for screening for other disorder: Secondary | ICD-10-CM | POA: Diagnosis not present

## 2014-10-16 DIAGNOSIS — F419 Anxiety disorder, unspecified: Secondary | ICD-10-CM | POA: Diagnosis not present

## 2014-10-16 DIAGNOSIS — R938 Abnormal findings on diagnostic imaging of other specified body structures: Secondary | ICD-10-CM | POA: Diagnosis not present

## 2014-10-16 DIAGNOSIS — E559 Vitamin D deficiency, unspecified: Secondary | ICD-10-CM | POA: Diagnosis not present

## 2014-10-16 DIAGNOSIS — E669 Obesity, unspecified: Secondary | ICD-10-CM | POA: Diagnosis not present

## 2014-10-16 DIAGNOSIS — Z79899 Other long term (current) drug therapy: Secondary | ICD-10-CM | POA: Diagnosis not present

## 2014-10-16 DIAGNOSIS — R8290 Unspecified abnormal findings in urine: Secondary | ICD-10-CM | POA: Diagnosis not present

## 2014-10-16 DIAGNOSIS — M069 Rheumatoid arthritis, unspecified: Secondary | ICD-10-CM | POA: Diagnosis not present

## 2014-10-16 DIAGNOSIS — E780 Pure hypercholesterolemia: Secondary | ICD-10-CM | POA: Diagnosis not present

## 2014-10-16 DIAGNOSIS — I10 Essential (primary) hypertension: Secondary | ICD-10-CM | POA: Diagnosis not present

## 2014-10-20 DIAGNOSIS — Z79899 Other long term (current) drug therapy: Secondary | ICD-10-CM | POA: Diagnosis not present

## 2014-10-22 ENCOUNTER — Other Ambulatory Visit (HOSPITAL_COMMUNITY): Payer: Self-pay | Admitting: Family Medicine

## 2014-10-22 ENCOUNTER — Ambulatory Visit (HOSPITAL_COMMUNITY)
Admission: RE | Admit: 2014-10-22 | Discharge: 2014-10-22 | Disposition: A | Discharge status: Home / Self Care | Payer: Medicare Other | Source: Ambulatory Visit | Attending: Family Medicine | Admitting: Family Medicine

## 2014-10-22 DIAGNOSIS — M539 Dorsopathy, unspecified: Secondary | ICD-10-CM | POA: Diagnosis present

## 2014-10-22 DIAGNOSIS — R9389 Abnormal findings on diagnostic imaging of other specified body structures: Secondary | ICD-10-CM

## 2014-10-22 DIAGNOSIS — M5134 Other intervertebral disc degeneration, thoracic region: Secondary | ICD-10-CM | POA: Diagnosis not present

## 2014-11-17 DIAGNOSIS — Z1231 Encounter for screening mammogram for malignant neoplasm of breast: Secondary | ICD-10-CM | POA: Diagnosis not present

## 2014-11-17 DIAGNOSIS — M810 Age-related osteoporosis without current pathological fracture: Secondary | ICD-10-CM | POA: Diagnosis not present

## 2014-12-21 DIAGNOSIS — Z79899 Other long term (current) drug therapy: Secondary | ICD-10-CM | POA: Diagnosis not present

## 2014-12-23 DIAGNOSIS — H2513 Age-related nuclear cataract, bilateral: Secondary | ICD-10-CM | POA: Diagnosis not present

## 2014-12-23 DIAGNOSIS — H3342 Traction detachment of retina, left eye: Secondary | ICD-10-CM | POA: Diagnosis not present

## 2014-12-31 DIAGNOSIS — M19271 Secondary osteoarthritis, right ankle and foot: Secondary | ICD-10-CM | POA: Diagnosis not present

## 2014-12-31 DIAGNOSIS — Z79899 Other long term (current) drug therapy: Secondary | ICD-10-CM | POA: Diagnosis not present

## 2014-12-31 DIAGNOSIS — M170 Bilateral primary osteoarthritis of knee: Secondary | ICD-10-CM | POA: Diagnosis not present

## 2014-12-31 DIAGNOSIS — M19241 Secondary osteoarthritis, right hand: Secondary | ICD-10-CM | POA: Diagnosis not present

## 2015-02-05 ENCOUNTER — Other Ambulatory Visit: Payer: Self-pay | Admitting: Internal Medicine

## 2015-02-15 ENCOUNTER — Telehealth: Payer: Self-pay | Admitting: Internal Medicine

## 2015-02-15 MED ORDER — AZITHROMYCIN 250 MG PO TABS: ORAL_TABLET | ORAL | Status: DC

## 2015-02-15 MED ORDER — PREDNISONE 10 MG PO TABS
ORAL_TABLET | ORAL | Status: DC
Start: 2015-02-15 — End: 2015-02-25

## 2015-02-15 NOTE — Telephone Encounter (Signed)
Pt aware of recs. RX's sent in. Nothing further needed 

## 2015-02-15 NOTE — Telephone Encounter (Signed)
Offer Z pak           Prednisone 10 mg, # 20, 4 X 2 DAYS, 3 X 2 DAYS, 2 X 2 DAYS, 1 X 2 DAYS           Ok to use otc Delsym or muxcinex-DM

## 2015-02-15 NOTE — Telephone Encounter (Signed)
Called and spoke to pt. Pt c/o increase in SOB (audible dyspnea on phone), increase in non prod cough, wheezing, chest soreness from cough, and weakness x 3 days. Pt is using albuterol inhaler twice daily. Pt denies taking temperature but is having sweats, denies chest congestion. Pt has upcoming appt with [facility] on 10.20.2016.  Dr. Maple Hudson please advise. Thanks.   Allergies  Allergen Reactions  . Piroxicam     REACTION: effected eyes    Current Outpatient Prescriptions on File Prior to Visit  Medication Sig Dispense Refill  . acetaminophen (TYLENOL) 650 MG CR tablet Take 650 mg by mouth. 1 tablet 2-3 times a day     . hydrochlorothiazide (HYDRODIURIL) 25 MG tablet Take 25 mg by mouth daily.      Marland Kitchen lovastatin (MEVACOR) 20 MG tablet Take 20 mg by mouth at bedtime.      Marland Kitchen QVAR 40 MCG/ACT inhaler USE 2 PUFFS TWICE DAILY. RINSE MOUTH AFTER USE. 8.7 g 0  . sertraline (ZOLOFT) 50 MG tablet Take 25 mg by mouth daily.     Marland Kitchen Spacer/Aero Chamber Mouthpiece MISC by Does not apply route.      . VENTOLIN HFA 108 (90 BASE) MCG/ACT inhaler INHALE 1-2 PUFFS EVERY 4 HOURS AS NEEDED FOR SHORTNESS OF BREATH/WHEEZING. 18 g PRN   No current facility-administered medications on file prior to visit.

## 2015-02-25 ENCOUNTER — Other Ambulatory Visit (INDEPENDENT_AMBULATORY_CARE_PROVIDER_SITE_OTHER): Payer: Medicare Other

## 2015-02-25 ENCOUNTER — Ambulatory Visit (INDEPENDENT_AMBULATORY_CARE_PROVIDER_SITE_OTHER): Payer: Medicare Other | Admitting: Internal Medicine

## 2015-02-25 ENCOUNTER — Ambulatory Visit (INDEPENDENT_AMBULATORY_CARE_PROVIDER_SITE_OTHER)
Admission: RE | Admit: 2015-02-25 | Discharge: 2015-02-25 | Disposition: A | Discharge status: Home / Self Care | Payer: Medicare Other | Source: Ambulatory Visit | Attending: Internal Medicine | Admitting: Internal Medicine

## 2015-02-25 ENCOUNTER — Encounter: Payer: Self-pay | Admitting: Internal Medicine

## 2015-02-25 VITALS — BP 138/76 | HR 84 | Ht 62.0 in | Wt 233.4 lb

## 2015-02-25 DIAGNOSIS — J45998 Other asthma: Secondary | ICD-10-CM

## 2015-02-25 DIAGNOSIS — R05 Cough: Secondary | ICD-10-CM | POA: Diagnosis not present

## 2015-02-25 DIAGNOSIS — J209 Acute bronchitis, unspecified: Secondary | ICD-10-CM

## 2015-02-25 LAB — CBC WITH DIFFERENTIAL/PLATELET
BASOS PCT: 0.2 % (ref 0.0–3.0)
Basophils Absolute: 0 10*3/uL (ref 0.0–0.1)
EOS ABS: 0.2 10*3/uL (ref 0.0–0.7)
Eosinophils Relative: 2.1 % (ref 0.0–5.0)
HCT: 39.4 % (ref 36.0–46.0)
Hemoglobin: 13.4 g/dL (ref 12.0–15.0)
Lymphocytes Relative: 26.5 % (ref 12.0–46.0)
Lymphs Abs: 2.2 10*3/uL (ref 0.7–4.0)
MCHC: 34 g/dL (ref 30.0–36.0)
MCV: 97.8 fl (ref 78.0–100.0)
MONO ABS: 1.3 10*3/uL — AB (ref 0.1–1.0)
Monocytes Relative: 15.8 % — ABNORMAL HIGH (ref 3.0–12.0)
NEUTROS ABS: 4.5 10*3/uL (ref 1.4–7.7)
Neutrophils Relative %: 55.4 % (ref 43.0–77.0)
PLATELETS: 490 10*3/uL — AB (ref 150.0–400.0)
RBC: 4.03 Mil/uL (ref 3.87–5.11)
RDW: 14.2 % (ref 11.5–15.5)
WBC: 8.2 10*3/uL (ref 4.0–10.5)

## 2015-02-25 MED ORDER — LEVALBUTEROL HCL 0.63 MG/3ML IN NEBU
0.6300 mg | INHALATION_SOLUTION | Freq: Once | RESPIRATORY_TRACT | Status: AC
  Administered 2015-02-25: 0.63 mg via RESPIRATORY_TRACT

## 2015-02-25 MED ORDER — METHYLPREDNISOLONE ACETATE 80 MG/ML IJ SUSP
80.0000 mg | Freq: Once | INTRAMUSCULAR | Status: AC
  Administered 2015-02-25: 80 mg via INTRAMUSCULAR

## 2015-02-25 NOTE — Progress Notes (Signed)
03/02/11- 74 year old female never smoker followed for asthma. Last here 03/03/2010 She says medications are fine. Using an AeroChamber. Wheezing more often and noting more shortness of breath with exertion and during weather change. Using her rescue inhaler a few times a month. Gets hoarse easily with prolonged talking and finding it more difficult to sing. Admits being depressed by her age. Says Advair did not help in the past on limited trial. PFT 02/02/09  04/04/11- 74 year old female never smoker followed for asthma. Has had flu vaccine. Saw no difference adding Singulair. Weather changes are her biggest trigger. Got air purifier for her home. Not needing rescue inhaler at all now. Overall better in the past month. Qvar does help. Recent sore throat has come and gone some over the last 2 weeks without progressing.  09/28/11- 74 year old female never smoker followed for asthma. PCP Dr Gardiner Ramus  Denies any SOB, wheezing, cough, or congestion at this time She had a good winter after her last visit here.  Through the spring has had minor sneezing blamed on pollen. She is very pleased and considers her breathing to do well. Rarely needs a rescue inhaler. Continues Qvar 40 twice daily as instructed. She did not want to try reducing the dose for fear of aggravating her condition.  03/26/12- 74 year old female never smoker followed for asthma, allergic rhinitis. PCP Dr Gardiner Ramus FOLLOWS FOR: patient reports breathing is doing well, denies any other concerns at this time  Had flu vaccine. Maintenance therapy is Qvar 40 with rare need for rescue inhaler. She is satisfied. CXR 12/31/08-reviewed IMPRESSION:  Mild bronchitic changes.  Provider: Darrelyn Hillock   02/25/13- 74 year old female never smoker followed for asthma, allergic rhinitis. PCP Dr Gardiner Ramus FOLLOWS FOR:  Breathing is well, some SOB w/ exertion.  Denies any concerns today Dyspnea only if she hurries. Infrequent wheeze. Morning  cough. Qvar usually sufficient.   02/23/14- 73 year old female never smoker followed for asthma, allergic rhinitis. PCP Dr Gardiner Ramus FOLLOW FOR:  yearly OV; no complaints  02/25/15- 74 year old female never smoker followed for asthma, allergic rhinitis. PCP Dr Gardiner Ramus Follows for: Asthma. Pt c/o constant dry cough, hoarseness, wheeze and dyspnea with minimal exertion for several weeks. She completed the medications she was given from a telephone call. Pt states that her symptoms improved after Zpak/pred/Delsym but are still present. Feels warm without fever. Some wheeze. Ventolin gives temporary help. Methotrexate for rheumatoid arthritis. Joints are hurting her. CXR 09/25/14-  IMPRESSION: No acute disease. Electronically Signed  By: Drusilla Kanner M.D.  On: 09/25/2014 12:08  ROS-see HPI  Constitutional:   No-   weight loss, night sweats, fevers, chills, fatigue, lassitude. HEENT:   No-  headaches, difficulty swallowing, tooth/dental problems, sore throat,       No-  sneezing, itching, ear ache, nasal congestion, post nasal drip,  CV:  No-   chest pain, orthopnea, PND, swelling in lower extremities, anasarca, dizziness, palpitations Resp:   + shortness of breath with exertion or at rest.              No-   productive cough,  + non-productive cough,  No- coughing up of blood.              No-   change in color of mucus.  +rare wheezing.   Skin: No-   rash or lesions. GI:  No-   heartburn, indigestion, abdominal pain, nausea, vomiting,  GU:  MS: + joint pain or swelling.   Neuro-  nothing unusual Psych:  No- change in mood or affect. No depression or anxiety.  No memory loss.  OBJ General- Alert, Oriented, Affect-appropriate, Distress- none acute. + Obese Skin- rash-none, lesions- none, excoriation- none Lymphadenopathy- none Head- atraumatic            Eyes- Gross vision intact, PERRLA, conjunctivae clear secretions            Ears- Hearing, canals-normal             Nose- Clear, no-Septal dev, mucus, polyps, erosion, perforation             Throat- Mallampati III , mucosa clear , drainage- none, tonsils- atrophic. +Easy gag. Neck- flexible , trachea midline, no stridor , thyroid nl, carotid no bruit Chest - symmetrical excursion , unlabored           Heart/CV- RRR , no murmur , no gallop  , no rub, nl s1 s2                           - JVD- none , edema- none, stasis changes- none, varices- none           Lung- + faint rhonchi right base, wheeze- none, cough- none , dullness-none, rub- none           Chest wall-  Abd-  Br/ Gen/ Rectal- Not done, not indicated Extrem- cyanosis- none, clubbing, none, atrophy- none, strength- nl Neuro- grossly intact to observation

## 2015-02-25 NOTE — Patient Instructions (Signed)
Neb xop 0.63  Depo 80  Order- lab- CBC w diff  Order- CXR - dx acute bronchitis

## 2015-02-27 NOTE — Assessment & Plan Note (Signed)
Acute bronchitis complicated by treatment with methotrexate. Some of her discomfort may be from her arthritis Plan-CBC with differential, chest x-ray, wait on flu vaccine until feeling better, nebulizer treatments Xopenex, Depo-Medrol

## 2015-03-03 ENCOUNTER — Other Ambulatory Visit: Payer: Self-pay | Admitting: Internal Medicine

## 2015-03-15 ENCOUNTER — Encounter: Payer: Self-pay | Admitting: Internal Medicine

## 2015-03-15 DIAGNOSIS — Z23 Encounter for immunization: Secondary | ICD-10-CM | POA: Diagnosis not present

## 2015-03-15 NOTE — Telephone Encounter (Signed)
Patient notified our office of her flu shot. Given today 11/7 at [facility] Updated immunization record. Patient notified that her records have been updated. Nothing further needed. Closing encounter

## 2015-03-22 DIAGNOSIS — Z79899 Other long term (current) drug therapy: Secondary | ICD-10-CM | POA: Diagnosis not present

## 2015-03-23 DIAGNOSIS — Z79899 Other long term (current) drug therapy: Secondary | ICD-10-CM | POA: Diagnosis not present

## 2015-03-23 DIAGNOSIS — M19041 Primary osteoarthritis, right hand: Secondary | ICD-10-CM | POA: Diagnosis not present

## 2015-03-23 DIAGNOSIS — M19071 Primary osteoarthritis, right ankle and foot: Secondary | ICD-10-CM | POA: Diagnosis not present

## 2015-03-23 DIAGNOSIS — M0579 Rheumatoid arthritis with rheumatoid factor of multiple sites without organ or systems involvement: Secondary | ICD-10-CM | POA: Diagnosis not present

## 2015-03-31 ENCOUNTER — Encounter: Payer: Self-pay | Admitting: Podiatry

## 2015-03-31 ENCOUNTER — Ambulatory Visit (INDEPENDENT_AMBULATORY_CARE_PROVIDER_SITE_OTHER): Payer: Medicare Other

## 2015-03-31 ENCOUNTER — Ambulatory Visit (INDEPENDENT_AMBULATORY_CARE_PROVIDER_SITE_OTHER): Payer: Medicare Other | Admitting: Podiatry

## 2015-03-31 DIAGNOSIS — M779 Enthesopathy, unspecified: Secondary | ICD-10-CM | POA: Diagnosis not present

## 2015-03-31 DIAGNOSIS — S93402A Sprain of unspecified ligament of left ankle, initial encounter: Secondary | ICD-10-CM

## 2015-03-31 DIAGNOSIS — M79673 Pain in unspecified foot: Secondary | ICD-10-CM | POA: Diagnosis not present

## 2015-03-31 MED ORDER — TRIAMCINOLONE ACETONIDE 10 MG/ML IJ SUSP
10.0000 mg | Freq: Once | INTRAMUSCULAR | Status: AC
  Administered 2015-03-31: 10 mg

## 2015-03-31 NOTE — Progress Notes (Signed)
   Subjective:    Patient ID: Monica [name], female    DOB: Mar 03, 1941, 74 y.o.   MRN: [id number]  HPI Comments: "I have pain in my feet"  Patient presents with: Foot Pain: Dorsal and lateral foot bilateral - aching and swelling for several months, notices the 1st MPJ area looks red, does have RA    Foot Pain Associated symptoms include arthralgias.      Review of Systems  Musculoskeletal: Positive for arthralgias and gait problem.  All other systems reviewed and are negative.      Objective:   Physical Exam        Assessment & Plan:

## 2015-04-01 NOTE — Progress Notes (Signed)
Subjective:     Patient ID: Monica [name], female   DOB: 1940/05/28, 74 y.o.   MRN: [id number]  HPI patient presents with quite a bit of discomfort in the dorsum of both feet and also a sprained ankle left lateral side that's very painful when pressed. States the dorsal pain has been present for a long time and is worsened over the last few months and that she's tried to change shoe gear and tried heat and ice without relief   Review of Systems  All other systems reviewed and are negative.      Objective:   Physical Exam  Constitutional: She is oriented to person, place, and time.  Cardiovascular: Intact distal pulses.   Musculoskeletal: Normal range of motion.  Neurological: She is oriented to person, place, and time.  Skin: Skin is warm.  Nursing note and vitals reviewed.  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 Dorsal injection was administered bilateral 3 mg Kenalog 5 mg Xylocaine to reduce inflammation and recommended heat therapy and shoe gear modifications. Applied brace to the left ankle to support the ankle that is unstable and also advised on ice therapy and reduced activity. Reappoint to recheck

## 2015-04-06 ENCOUNTER — Other Ambulatory Visit: Payer: Self-pay | Admitting: Internal Medicine

## 2015-04-07 DIAGNOSIS — H04121 Dry eye syndrome of right lacrimal gland: Secondary | ICD-10-CM | POA: Diagnosis not present

## 2015-04-07 DIAGNOSIS — H16201 Unspecified keratoconjunctivitis, right eye: Secondary | ICD-10-CM | POA: Diagnosis not present

## 2015-04-13 DIAGNOSIS — M069 Rheumatoid arthritis, unspecified: Secondary | ICD-10-CM | POA: Diagnosis not present

## 2015-04-13 DIAGNOSIS — F419 Anxiety disorder, unspecified: Secondary | ICD-10-CM | POA: Diagnosis not present

## 2015-04-13 DIAGNOSIS — I10 Essential (primary) hypertension: Secondary | ICD-10-CM | POA: Diagnosis not present

## 2015-04-13 DIAGNOSIS — E7800 Pure hypercholesterolemia, unspecified: Secondary | ICD-10-CM | POA: Diagnosis not present

## 2015-04-13 DIAGNOSIS — F339 Major depressive disorder, recurrent, unspecified: Secondary | ICD-10-CM | POA: Diagnosis not present

## 2015-04-13 DIAGNOSIS — R7301 Impaired fasting glucose: Secondary | ICD-10-CM | POA: Diagnosis not present

## 2015-04-14 ENCOUNTER — Ambulatory Visit (INDEPENDENT_AMBULATORY_CARE_PROVIDER_SITE_OTHER): Payer: Medicare Other | Admitting: Podiatry

## 2015-04-14 ENCOUNTER — Ambulatory Visit: Payer: Medicare Other | Admitting: Podiatry

## 2015-04-14 ENCOUNTER — Encounter: Payer: Self-pay | Admitting: Podiatry

## 2015-04-14 DIAGNOSIS — M779 Enthesopathy, unspecified: Secondary | ICD-10-CM

## 2015-04-14 DIAGNOSIS — S93402A Sprain of unspecified ligament of left ankle, initial encounter: Secondary | ICD-10-CM | POA: Diagnosis not present

## 2015-04-15 NOTE — Progress Notes (Signed)
Subjective:     Patient ID: Monica [name], female   DOB: Apr 17, 1941, 74 y.o.   MRN: [id number]  HPI patient states I'm feeling quite a bit better but still having discomfort at times on the dorsum of the feet and I still have swelling in my left ankle from the spine   Review of Systems     Objective:   Physical Exam Neurovascular status intact muscle strength adequate with diminishment of discomfort in the dorsum of the left foot and right foot bilateral but still quite a bit of edema in the lateral ankle left but no indications of instability    Assessment:     Improve tendinitis bilateral with ankle sprain still present left but improving    Plan:     Advised on continued brace usage and dispensed Ace wrap with instructions on elevation compression at nighttime. This should be uneventful and healing but patient will reappoint if it's not better in the next 4 weeks and will need occasional dorsal injections for chronic foot

## 2015-05-07 ENCOUNTER — Other Ambulatory Visit: Payer: Self-pay | Admitting: Internal Medicine

## 2015-06-01 DIAGNOSIS — Z79899 Other long term (current) drug therapy: Secondary | ICD-10-CM | POA: Diagnosis not present

## 2015-06-01 DIAGNOSIS — M79671 Pain in right foot: Secondary | ICD-10-CM | POA: Diagnosis not present

## 2015-06-01 DIAGNOSIS — M25511 Pain in right shoulder: Secondary | ICD-10-CM | POA: Diagnosis not present

## 2015-06-01 DIAGNOSIS — M0579 Rheumatoid arthritis with rheumatoid factor of multiple sites without organ or systems involvement: Secondary | ICD-10-CM | POA: Diagnosis not present

## 2015-06-15 DIAGNOSIS — Z79899 Other long term (current) drug therapy: Secondary | ICD-10-CM | POA: Diagnosis not present

## 2015-06-29 DIAGNOSIS — Z79899 Other long term (current) drug therapy: Secondary | ICD-10-CM | POA: Diagnosis not present

## 2015-07-02 ENCOUNTER — Encounter: Payer: Self-pay | Admitting: Internal Medicine

## 2015-07-02 ENCOUNTER — Ambulatory Visit (INDEPENDENT_AMBULATORY_CARE_PROVIDER_SITE_OTHER): Payer: Medicare Other | Admitting: Internal Medicine

## 2015-07-02 VITALS — BP 118/76 | HR 76 | Ht 62.0 in | Wt 242.8 lb

## 2015-07-02 DIAGNOSIS — J302 Other seasonal allergic rhinitis: Secondary | ICD-10-CM | POA: Diagnosis not present

## 2015-07-02 DIAGNOSIS — J45998 Other asthma: Secondary | ICD-10-CM | POA: Diagnosis not present

## 2015-07-02 DIAGNOSIS — J4531 Mild persistent asthma with (acute) exacerbation: Secondary | ICD-10-CM | POA: Diagnosis not present

## 2015-07-02 MED ORDER — FLUTICASONE FUROATE-VILANTEROL 100-25 MCG/INH IN AEPB: 1.0000 | INHALATION_SPRAY | Freq: Every day | RESPIRATORY_TRACT | Status: DC

## 2015-07-02 MED ORDER — FLUTICASONE FUROATE-VILANTEROL 100-25 MCG/INH IN AEPB: INHALATION_SPRAY | RESPIRATORY_TRACT | Status: DC

## 2015-07-02 NOTE — Assessment & Plan Note (Signed)
Not sure if increasing cough is due to change from methotrexate. Plan-try Breo inhaler

## 2015-07-02 NOTE — Assessment & Plan Note (Signed)
Lack of rhinitis symptoms associated with current cough complaint makes a pollen allergy etiology unlikely

## 2015-07-02 NOTE — Patient Instructions (Signed)
Sample and printed script for Breo 100     Inhale 1 puff then rinse mouth, once daily    Try this instead of Qvar  Ok still to use the albuterol rescue inhaler as before, if needed

## 2015-07-02 NOTE — Progress Notes (Signed)
03/02/11- 75 year old female never smoker followed for asthma. Last here 03/03/2010 She says medications are fine. Using an AeroChamber. Wheezing more often and noting more shortness of breath with exertion and during weather change. Using her rescue inhaler a few times a month. Gets hoarse easily with prolonged talking and finding it more difficult to sing. Admits being depressed by her age. Says Advair did not help in the past on limited trial. PFT 02/02/09  04/04/11- 75 year old female never smoker followed for asthma. Has had flu vaccine. Saw no difference adding Singulair. Weather changes are her biggest trigger. Got air purifier for her home. Not needing rescue inhaler at all now. Overall better in the past month. Qvar does help. Recent sore throat has come and gone some over the last 2 weeks without progressing.  09/28/11- 75 year old female never smoker followed for asthma. PCP Dr Gardiner Ramus  Denies any SOB, wheezing, cough, or congestion at this time She had a good winter after her last visit here.  Through the spring has had minor sneezing blamed on pollen. She is very pleased and considers her breathing to do well. Rarely needs a rescue inhaler. Continues Qvar 40 twice daily as instructed. She did not want to try reducing the dose for fear of aggravating her condition.  03/26/12- 75 year old female never smoker followed for asthma, allergic rhinitis. PCP Dr Gardiner Ramus FOLLOWS FOR: patient reports breathing is doing well, denies any other concerns at this time  Had flu vaccine. Maintenance therapy is Qvar 40 with rare need for rescue inhaler. She is satisfied. CXR 12/31/08-reviewed IMPRESSION:  Mild bronchitic changes.  Provider: Darrelyn Hillock   02/25/13- 75 year old female never smoker followed for asthma, allergic rhinitis. PCP Dr Gardiner Ramus FOLLOWS FOR:  Breathing is well, some SOB w/ exertion.  Denies any concerns today Dyspnea only if she hurries. Infrequent wheeze. Morning  cough. Qvar usually sufficient.   02/23/14- 75 year old female never smoker followed for asthma, allergic rhinitis. PCP Dr Gardiner Ramus FOLLOW FOR:  yearly OV; no complaints  02/25/15- 75 year old female never smoker followed for asthma, allergic rhinitis. PCP Dr Gardiner Ramus Follows for: Asthma. Pt c/o constant dry cough, hoarseness, wheeze and dyspnea with minimal exertion for several weeks. She completed the medications she was given from a telephone call. Pt states that her symptoms improved after Zpak/pred/Delsym but are still present. Feels warm without fever. Some wheeze. Ventolin gives temporary help. Methotrexate for rheumatoid arthritis. Joints are hurting her. CXR 09/25/14-  IMPRESSION: No acute disease. Electronically Signed  By: Drusilla Kanner M.D.  On: 09/25/2014 12:08  07/02/2015-75 year old female never smoker followed for asthma, allergic rhinitis, complicated by rheumatoid arthritis/Arava FOLLOWS FOR: Pt states her breathing got better as well as her cough; however first part of this month has noticed increasing cough-raspy type cough-non productive, lower energy levels as well. Cough had significantly improved after last visit. It has gradually increased again over the past month is dry cough without shortness of breath wheeze or recognized trigger. Using Qvar 40 twice daily and occasional rescue inhaler once or twice a week. She was changed from MTX to [place] one month ago. CXR 02/25/2015 Findings: Slight peribronchial thickening. Heart and mediastinal contours are within normal limits. No focal opacities or effusions. No acute bony abnormality. IMPRESSION: Mild bronchitic changes. Provider: Darrelyn Hillock  ROS-see HPI  Constitutional:   No-   weight loss, night sweats, fevers, chills, fatigue, lassitude. HEENT:   No-  headaches, difficulty swallowing, tooth/dental problems, sore throat,       No-  sneezing, itching, ear ache, nasal congestion, post nasal drip,   CV:  No-   chest pain, orthopnea, PND, swelling in lower extremities, anasarca, dizziness, palpitations Resp:   + shortness of breath with exertion or at rest.              No-   productive cough,  + non-productive cough,  No- coughing up of blood.              No-   change in color of mucus.  +rare wheezing.   Skin: No-   rash or lesions. GI:  No-   heartburn, indigestion, abdominal pain, nausea, vomiting,  GU:  MS: + joint pain or swelling.   Neuro-     nothing unusual Psych:  No- change in mood or affect. No depression or anxiety.  No memory loss.  OBJ General- Alert, Oriented, Affect-appropriate, Distress- none acute. + Obese Skin- rash-none, lesions- none, excoriation- none Lymphadenopathy- none Head- atraumatic            Eyes- Gross vision intact, PERRLA, conjunctivae clear secretions            Ears- Hearing, canals-normal            Nose- Clear, no-Septal dev, mucus, polyps, erosion, perforation             Throat- Mallampati III , mucosa clear , drainage- none, tonsils- atrophic. +Easy gag. Neck- flexible , trachea midline, no stridor , thyroid nl, carotid no bruit Chest - symmetrical excursion , unlabored           Heart/CV- RRR , no murmur , no gallop  , no rub, nl s1 s2                           - JVD- none , edema- none, stasis changes- none, varices- none           Lung- clear, wheeze- none, cough- none , dullness-none, rub- none           Chest wall-  Abd-  Br/ Gen/ Rectal- Not done, not indicated Extrem- cyanosis- none, clubbing, none, atrophy- none, strength- nl Neuro- grossly intact to observation

## 2015-07-13 DIAGNOSIS — M0579 Rheumatoid arthritis with rheumatoid factor of multiple sites without organ or systems involvement: Secondary | ICD-10-CM | POA: Diagnosis not present

## 2015-07-13 DIAGNOSIS — E559 Vitamin D deficiency, unspecified: Secondary | ICD-10-CM | POA: Diagnosis not present

## 2015-07-13 DIAGNOSIS — M2550 Pain in unspecified joint: Secondary | ICD-10-CM | POA: Diagnosis not present

## 2015-07-13 DIAGNOSIS — M79641 Pain in right hand: Secondary | ICD-10-CM | POA: Diagnosis not present

## 2015-07-13 DIAGNOSIS — Z113 Encounter for screening for infections with a predominantly sexual mode of transmission: Secondary | ICD-10-CM | POA: Diagnosis not present

## 2015-07-13 DIAGNOSIS — M25562 Pain in left knee: Secondary | ICD-10-CM | POA: Diagnosis not present

## 2015-07-13 DIAGNOSIS — M25561 Pain in right knee: Secondary | ICD-10-CM | POA: Diagnosis not present

## 2015-07-13 DIAGNOSIS — M79672 Pain in left foot: Secondary | ICD-10-CM | POA: Diagnosis not present

## 2015-07-13 DIAGNOSIS — M79642 Pain in left hand: Secondary | ICD-10-CM | POA: Diagnosis not present

## 2015-07-13 DIAGNOSIS — M79671 Pain in right foot: Secondary | ICD-10-CM | POA: Diagnosis not present

## 2015-07-26 ENCOUNTER — Encounter (HOSPITAL_COMMUNITY): Payer: Self-pay

## 2015-07-26 ENCOUNTER — Encounter (HOSPITAL_COMMUNITY)
Admission: RE | Admit: 2015-07-26 | Discharge: 2015-07-26 | Disposition: A | Discharge status: Home / Self Care | Payer: Medicare Other | Source: Ambulatory Visit | Attending: Rheumatology | Admitting: Rheumatology

## 2015-07-26 DIAGNOSIS — M0579 Rheumatoid arthritis with rheumatoid factor of multiple sites without organ or systems involvement: Secondary | ICD-10-CM | POA: Insufficient documentation

## 2015-07-26 MED ORDER — SODIUM CHLORIDE 0.9 % IV SOLN
INTRAVENOUS | Status: DC
  Administered 2015-07-26: 250 mL via INTRAVENOUS

## 2015-07-26 MED ORDER — SODIUM CHLORIDE 0.9 % IV SOLN
212.0000 mg | Freq: Once | INTRAVENOUS | Status: AC
  Administered 2015-07-26: 212 mg via INTRAVENOUS
  Filled 2015-07-26: qty 16.96

## 2015-07-26 MED ORDER — SODIUM CHLORIDE 0.9 % IV SOLN
2.0000 mg/kg | Freq: Once | INTRAVENOUS | Status: DC
  Filled 2015-07-26: qty 17.5

## 2015-07-26 NOTE — Discharge Instructions (Signed)
Golimumab injection °What is this medicine? °GOLIMUMAB (goe LIM ue mab) is used to treat rheumatoid arthritis, psoriatic arthritis, ulcerative colitis, and ankylosing spondylitis. °This medicine may be used for other purposes; ask your health care provider or pharmacist if you have questions. °What should I tell my health care provider before I take this medicine? °They need to know if you have any of these conditions: °-cancer °-diabetes °-heart disease °-hepatitis B or history of hepatitis B infection °-immune system problems °-infection or history of infections °-low blood counts like low white cell, platelet, or red cell counts °-multiple sclerosis °-recently received or scheduled to receive a vaccine °-scheduled to have surgery °-tuberculosis, a positive skin test for tuberculosis or have recently been in close contact with someone who has tuberculosis °-an unusual reaction to golimumab, other medicines, latex, rubber, foods, dyes, or preservatives °-pregnant or trying to get pregnant °-breast-feeding °How should I use this medicine? °This medicine is for injection under the skin. You will be taught how to prepare and give this medicine. Use exactly as directed. Take your medicine at regular intervals. Do not take your medicine more often than directed. More information is available by calling [phone]. °It is important that you put your used needles and syringes in a special sharps container. Do not put them in a trash can. If you do not have a sharps container, call your pharmacist or healthcare provider to get one. °A special MedGuide will be given to you by the pharmacist with each prescription and refill. Be sure to read this information carefully each time. °Talk to your pediatrician regarding the use of this medicine in children. Special care may be needed. °Overdosage: If you think you have taken too much of this medicine contact a poison control center or emergency room at once. °NOTE: This  medicine is only for you. Do not share this medicine with others. °What if I miss a dose? °If you miss a dose, take it as soon as you can. If it is almost time for your next dose, take only that dose. Do not take double or extra doses. Call your doctor or health care professional if you are not sure how to handle a missed dose. °What may interact with this medicine? °Do not take this medicine with any of the following medications: °-abatacept °-adalimumab °-anakinra °-certolizumab °-etanercept °-infliximab °-live virus vaccines °-rilonacept °This medicine may also interact with the following medications: °-cyclosporine °-rituximab °-theophylline °-vaccines °-warfarin °This list may not describe all possible interactions. Give your health care provider a list of all the medicines, herbs, non-prescription drugs, or dietary supplements you use. Also tell them if you smoke, drink alcohol, or use illegal drugs. Some items may interact with your medicine. °What should I watch for while using this medicine? °Visit your doctor or health care professional for regular checks on your progress. Tell your doctor or healthcare professional if your symptoms do not start to get better or if they get worse. °You will be tested for tuberculosis (TB) before you start this medicine. If your doctor prescribes any medicine for TB, you should start taking the TB medicine before starting this medicine. Make sure to finish the full course of TB medicine. °Call your doctor or health care professional if you get a cold or other infection while receiving this medicine. Do not treat yourself. This medicine may decrease your body's ability to fight infection. °Talk to your doctor about your risk of cancer. You may be more at risk for certain types   of cancers if you take this medicine. °What side effects may I notice from receiving this medicine? °Side effects that you should report to your doctor or health care professional as soon as  possible: °-allergic reactions like skin rash, itching or hives, swelling of the face, lips, or tongue °-breathing problems °-changes in vision °-chest pain °-dark urine °-fever, chills, or any other sign of infection °-light-colored stools °-muscle pain or weakness °-numbness or tingling °-red, scaly patches or raised bumps on the skin °-right upper belly pain °-swelling of the ankles °-swollen lymph nodes in the neck, underarm, or groin areas °-unexplained weight loss °-unusual bleeding or bruising °-unusually weak or tired °-yellowing of the eyes or skin °Side effects that usually do not require medical attention (report to your doctor or health care professional if they continue or are bothersome): °-dizziness °-nausea °-redness, itching, swelling, or bruising at site where injected °This list may not describe all possible side effects. Call your doctor for medical advice about side effects. You may report side effects to FDA at 1-800-FDA-1088. °Where should I keep my medicine? °Keep out of the reach of children. °Store in the original container and in the refrigerator between 2 and 8 degrees C (36 and 46 degrees F). Do not freeze. Protect from light. Throw away any unused medicine after the expiration date. °NOTE: This sheet is a summary. It may not cover all possible information. If you have questions about this medicine, talk to your doctor, pharmacist, or health care provider. °  °© 2016, [employer]. (2011-11-28 13:41:25) ° °

## 2015-08-11 DIAGNOSIS — Z79899 Other long term (current) drug therapy: Secondary | ICD-10-CM | POA: Diagnosis not present

## 2015-08-11 DIAGNOSIS — H2513 Age-related nuclear cataract, bilateral: Secondary | ICD-10-CM | POA: Diagnosis not present

## 2015-08-11 DIAGNOSIS — H524 Presbyopia: Secondary | ICD-10-CM | POA: Diagnosis not present

## 2015-08-11 DIAGNOSIS — H25041 Posterior subcapsular polar age-related cataract, right eye: Secondary | ICD-10-CM | POA: Diagnosis not present

## 2015-08-23 ENCOUNTER — Encounter (HOSPITAL_COMMUNITY)
Admission: RE | Admit: 2015-08-23 | Discharge: 2015-08-23 | Disposition: A | Discharge status: Home / Self Care | Payer: Medicare Other | Source: Ambulatory Visit | Attending: Rheumatology | Admitting: Rheumatology

## 2015-08-23 DIAGNOSIS — M0579 Rheumatoid arthritis with rheumatoid factor of multiple sites without organ or systems involvement: Secondary | ICD-10-CM | POA: Diagnosis not present

## 2015-08-23 LAB — CBC
HEMATOCRIT: 38.7 % (ref 36.0–46.0)
HEMOGLOBIN: 12.9 g/dL (ref 12.0–15.0)
MCH: 32 pg (ref 26.0–34.0)
MCHC: 33.3 g/dL (ref 30.0–36.0)
MCV: 96 fL (ref 78.0–100.0)
Platelets: 317 10*3/uL (ref 150–400)
RBC: 4.03 MIL/uL (ref 3.87–5.11)
RDW: 13.1 % (ref 11.5–15.5)
WBC: 5.7 10*3/uL (ref 4.0–10.5)

## 2015-08-23 LAB — DIFFERENTIAL
BASOS ABS: 0 10*3/uL (ref 0.0–0.1)
Basophils Relative: 0 %
EOS ABS: 0.3 10*3/uL (ref 0.0–0.7)
Eosinophils Relative: 4 %
LYMPHS ABS: 1.3 10*3/uL (ref 0.7–4.0)
Lymphocytes Relative: 22 %
MONOS PCT: 11 %
Monocytes Absolute: 0.6 10*3/uL (ref 0.1–1.0)
Neutro Abs: 3.6 10*3/uL (ref 1.7–7.7)
Neutrophils Relative %: 63 %

## 2015-08-23 LAB — COMPREHENSIVE METABOLIC PANEL
ALT: 10 U/L — ABNORMAL LOW (ref 14–54)
AST: 21 U/L (ref 15–41)
Albumin: 3.9 g/dL (ref 3.5–5.0)
Alkaline Phosphatase: 75 U/L (ref 38–126)
Anion gap: 9 (ref 5–15)
BILIRUBIN TOTAL: 0.4 mg/dL (ref 0.3–1.2)
BUN: 11 mg/dL (ref 6–20)
CALCIUM: 8.8 mg/dL — AB (ref 8.9–10.3)
CO2: 28 mmol/L (ref 22–32)
CREATININE: 0.65 mg/dL (ref 0.44–1.00)
Chloride: 102 mmol/L (ref 101–111)
GFR calc Af Amer: >60 mL/min (ref >60)
Glucose, Bld: 123 mg/dL — ABNORMAL HIGH (ref 65–99)
POTASSIUM: 3.6 mmol/L (ref 3.5–5.1)
Sodium: 139 mmol/L (ref 135–145)
TOTAL PROTEIN: 6.8 g/dL (ref 6.5–8.1)

## 2015-08-23 MED ORDER — SODIUM CHLORIDE 0.9 % IV SOLN
212.0000 mg | Freq: Once | INTRAVENOUS | Status: AC
  Administered 2015-08-23: 212 mg via INTRAVENOUS
  Filled 2015-08-23: qty 16.96

## 2015-08-23 NOTE — Discharge Instructions (Signed)
Golimumab injection °What is this medicine? °GOLIMUMAB (goe LIM ue mab) is used to treat rheumatoid arthritis, psoriatic arthritis, ulcerative colitis, and ankylosing spondylitis. °This medicine may be used for other purposes; ask your health care provider or pharmacist if you have questions. °What should I tell my health care provider before I take this medicine? °They need to know if you have any of these conditions: °-cancer °-diabetes °-heart disease °-hepatitis B or history of hepatitis B infection °-immune system problems °-infection or history of infections °-low blood counts like low white cell, platelet, or red cell counts °-multiple sclerosis °-recently received or scheduled to receive a vaccine °-scheduled to have surgery °-tuberculosis, a positive skin test for tuberculosis or have recently been in close contact with someone who has tuberculosis °-an unusual reaction to golimumab, other medicines, latex, rubber, foods, dyes, or preservatives °-pregnant or trying to get pregnant °-breast-feeding °How should I use this medicine? °This medicine is for injection under the skin. You will be taught how to prepare and give this medicine. Use exactly as directed. Take your medicine at regular intervals. Do not take your medicine more often than directed. More information is available by calling [phone]. °It is important that you put your used needles and syringes in a special sharps container. Do not put them in a trash can. If you do not have a sharps container, call your pharmacist or healthcare provider to get one. °A special MedGuide will be given to you by the pharmacist with each prescription and refill. Be sure to read this information carefully each time. °Talk to your pediatrician regarding the use of this medicine in children. Special care may be needed. °Overdosage: If you think you have taken too much of this medicine contact a poison control center or emergency room at once. °NOTE: This  medicine is only for you. Do not share this medicine with others. °What if I miss a dose? °If you miss a dose, take it as soon as you can. If it is almost time for your next dose, take only that dose. Do not take double or extra doses. Call your doctor or health care professional if you are not sure how to handle a missed dose. °What may interact with this medicine? °Do not take this medicine with any of the following medications: °-abatacept °-adalimumab °-anakinra °-certolizumab °-etanercept °-infliximab °-live virus vaccines °-rilonacept °This medicine may also interact with the following medications: °-cyclosporine °-rituximab °-theophylline °-vaccines °-warfarin °This list may not describe all possible interactions. Give your health care provider a list of all the medicines, herbs, non-prescription drugs, or dietary supplements you use. Also tell them if you smoke, drink alcohol, or use illegal drugs. Some items may interact with your medicine. °What should I watch for while using this medicine? °Visit your doctor or health care professional for regular checks on your progress. Tell your doctor or healthcare professional if your symptoms do not start to get better or if they get worse. °You will be tested for tuberculosis (TB) before you start this medicine. If your doctor prescribes any medicine for TB, you should start taking the TB medicine before starting this medicine. Make sure to finish the full course of TB medicine. °Call your doctor or health care professional if you get a cold or other infection while receiving this medicine. Do not treat yourself. This medicine may decrease your body's ability to fight infection. °Talk to your doctor about your risk of cancer. You may be more at risk for certain types   of cancers if you take this medicine. °What side effects may I notice from receiving this medicine? °Side effects that you should report to your doctor or health care professional as soon as  possible: °-allergic reactions like skin rash, itching or hives, swelling of the face, lips, or tongue °-breathing problems °-changes in vision °-chest pain °-dark urine °-fever, chills, or any other sign of infection °-light-colored stools °-muscle pain or weakness °-numbness or tingling °-red, scaly patches or raised bumps on the skin °-right upper belly pain °-swelling of the ankles °-swollen lymph nodes in the neck, underarm, or groin areas °-unexplained weight loss °-unusual bleeding or bruising °-unusually weak or tired °-yellowing of the eyes or skin °Side effects that usually do not require medical attention (report to your doctor or health care professional if they continue or are bothersome): °-dizziness °-nausea °-redness, itching, swelling, or bruising at site where injected °This list may not describe all possible side effects. Call your doctor for medical advice about side effects. You may report side effects to FDA at 1-800-FDA-1088. °Where should I keep my medicine? °Keep out of the reach of children. °Store in the original container and in the refrigerator between 2 and 8 degrees C (36 and 46 degrees F). Do not freeze. Protect from light. Throw away any unused medicine after the expiration date. °NOTE: This sheet is a summary. It may not cover all possible information. If you have questions about this medicine, talk to your doctor, pharmacist, or health care provider. °  °© 2016, [employer]. (2011-11-28 13:41:25) ° °

## 2015-08-26 ENCOUNTER — Other Ambulatory Visit (HOSPITAL_COMMUNITY): Payer: Self-pay | Admitting: Family Medicine

## 2015-08-26 ENCOUNTER — Ambulatory Visit (HOSPITAL_COMMUNITY)
Admission: RE | Admit: 2015-08-26 | Discharge: 2015-08-26 | Disposition: A | Discharge status: Home / Self Care | Payer: Medicare Other | Source: Ambulatory Visit | Attending: Family Medicine | Admitting: Family Medicine

## 2015-08-26 DIAGNOSIS — M069 Rheumatoid arthritis, unspecified: Secondary | ICD-10-CM | POA: Diagnosis not present

## 2015-08-26 DIAGNOSIS — R600 Localized edema: Secondary | ICD-10-CM

## 2015-08-26 DIAGNOSIS — J45909 Unspecified asthma, uncomplicated: Secondary | ICD-10-CM | POA: Diagnosis not present

## 2015-08-26 DIAGNOSIS — R609 Edema, unspecified: Secondary | ICD-10-CM | POA: Diagnosis not present

## 2015-10-12 DIAGNOSIS — R5383 Other fatigue: Secondary | ICD-10-CM | POA: Diagnosis not present

## 2015-10-12 DIAGNOSIS — M069 Rheumatoid arthritis, unspecified: Secondary | ICD-10-CM | POA: Diagnosis not present

## 2015-10-12 DIAGNOSIS — R05 Cough: Secondary | ICD-10-CM | POA: Diagnosis not present

## 2015-10-12 DIAGNOSIS — E7800 Pure hypercholesterolemia, unspecified: Secondary | ICD-10-CM | POA: Diagnosis not present

## 2015-10-12 DIAGNOSIS — Z1389 Encounter for screening for other disorder: Secondary | ICD-10-CM | POA: Diagnosis not present

## 2015-10-12 DIAGNOSIS — F339 Major depressive disorder, recurrent, unspecified: Secondary | ICD-10-CM | POA: Diagnosis not present

## 2015-10-12 DIAGNOSIS — R7301 Impaired fasting glucose: Secondary | ICD-10-CM | POA: Diagnosis not present

## 2015-10-12 DIAGNOSIS — J45909 Unspecified asthma, uncomplicated: Secondary | ICD-10-CM | POA: Diagnosis not present

## 2015-10-12 DIAGNOSIS — Z79899 Other long term (current) drug therapy: Secondary | ICD-10-CM | POA: Diagnosis not present

## 2015-10-12 DIAGNOSIS — I10 Essential (primary) hypertension: Secondary | ICD-10-CM | POA: Diagnosis not present

## 2015-10-18 ENCOUNTER — Encounter (HOSPITAL_COMMUNITY)
Admission: RE | Admit: 2015-10-18 | Discharge: 2015-10-18 | Disposition: A | Discharge status: Home / Self Care | Payer: Medicare Other | Source: Ambulatory Visit | Attending: Rheumatology | Admitting: Rheumatology

## 2015-10-18 DIAGNOSIS — M0579 Rheumatoid arthritis with rheumatoid factor of multiple sites without organ or systems involvement: Secondary | ICD-10-CM | POA: Diagnosis not present

## 2015-10-18 MED ORDER — SODIUM CHLORIDE 0.9 % IV SOLN
INTRAVENOUS | Status: DC
  Administered 2015-10-18: 10:00:00 via INTRAVENOUS

## 2015-10-18 MED ORDER — SODIUM CHLORIDE 0.9 % IV SOLN
2.0000 mg/kg | Freq: Once | INTRAVENOUS | Status: AC
  Administered 2015-10-18: 212.5 mg via INTRAVENOUS
  Filled 2015-10-18: qty 17

## 2015-10-21 DIAGNOSIS — M19241 Secondary osteoarthritis, right hand: Secondary | ICD-10-CM | POA: Diagnosis not present

## 2015-10-21 DIAGNOSIS — M174 Other bilateral secondary osteoarthritis of knee: Secondary | ICD-10-CM | POA: Diagnosis not present

## 2015-10-21 DIAGNOSIS — M0579 Rheumatoid arthritis with rheumatoid factor of multiple sites without organ or systems involvement: Secondary | ICD-10-CM | POA: Diagnosis not present

## 2015-10-21 DIAGNOSIS — Z09 Encounter for follow-up examination after completed treatment for conditions other than malignant neoplasm: Secondary | ICD-10-CM | POA: Diagnosis not present

## 2015-11-01 ENCOUNTER — Ambulatory Visit (INDEPENDENT_AMBULATORY_CARE_PROVIDER_SITE_OTHER): Payer: Medicare Other | Admitting: Internal Medicine

## 2015-11-01 ENCOUNTER — Encounter: Payer: Self-pay | Admitting: Internal Medicine

## 2015-11-01 DIAGNOSIS — J4531 Mild persistent asthma with (acute) exacerbation: Secondary | ICD-10-CM | POA: Diagnosis not present

## 2015-11-01 MED ORDER — UMECLIDINIUM-VILANTEROL 62.5-25 MCG/INH IN AEPB: INHALATION_SPRAY | RESPIRATORY_TRACT | Status: DC

## 2015-11-01 NOTE — Patient Instructions (Addendum)
Sample and script printed Anoro Ellipta maintenance controller    Inhale 1 puff deeply, once daily  Try this intead of Breo or Qvar.  If the sample seems good- get the script filled.   Ok to use the albuterol rescue inhaler if needed

## 2015-11-01 NOTE — Progress Notes (Signed)
HPI   female never smoker followed for asthma.   07/02/2015-75 year old female never smoker followed for asthma, allergic rhinitis, complicated by rheumatoid arthritis/Arava FOLLOWS FOR: Pt states her breathing got better as well as her cough; however first part of this month has noticed increasing cough-raspy type cough-non productive, lower energy levels as well. Cough had significantly improved after last visit. It has gradually increased again over the past month is dry cough without shortness of breath wheeze or recognized trigger. Using Qvar 40 twice daily and occasional rescue inhaler once or twice a week. She was changed from MTX to [place] one month ago. CXR 02/25/2015 Findings: Slight peribronchial thickening. Heart and mediastinal contours are within normal limits. No focal opacities or effusions. No acute bony abnormality. IMPRESSION: Mild bronchitic changes. Provider: Darrelyn Hillock  11/01/2015-75 year old female never smoker followed for asthma, allergic rhinitis, complicated by rheumatoid arthritis FOLLOWS FOR: pt states breathing is doing well. pt c/o cont prod cough w/clear mucus. cough has improved since stopping arava. Increased cough during the spring was reportedly associated with a rheumatoid arthritis medication( Arava) and resolved when that was discontinued. Still has some baseline cough, dry, not relieved by cough syrup and cough drops. Sometimes aware of minor asthma with occasional use of rescue inhaler.  Takes Lasix 20 mg occasionally for edema in her feet. CXR 08/26/2015 FINDINGS: The lungs are clear. The heart size and pulmonary vascularity are normal. No adenopathy. There is mild degenerative change in the thoracic spine. IMPRESSION: No edema or consolidation. Electronically Signed  By: Bretta Bang III M.D.  On: 08/26/2015 15:46  ROS-see HPI  Constitutional:   No-   weight loss, night sweats, fevers, chills, fatigue, lassitude. HEENT:    No-  headaches, difficulty swallowing, tooth/dental problems, sore throat,       No-  sneezing, itching, ear ache, nasal congestion, post nasal drip,  CV:  No-   chest pain, orthopnea, PND, +swelling in lower extremities, anasarca, dizziness, palpitations Resp:   + shortness of breath with exertion or at rest.              No-   productive cough,  + non-productive cough,  No- coughing up of blood.              No-   change in color of mucus.  +rare wheezing.   Skin: No-   rash or lesions. GI:  No-   heartburn, indigestion, abdominal pain, nausea, vomiting,  GU:  MS: + joint pain or swelling.   Neuro-     nothing unusual Psych:  No- change in mood or affect. No depression or anxiety.  No memory loss.  OBJ General- Alert, Oriented, Affect-appropriate, Distress- none acute. + Obese Skin- rash-none, lesions- none, excoriation- none Lymphadenopathy- none Head- atraumatic            Eyes- Gross vision intact, PERRLA, conjunctivae clear secretions            Ears- Hearing, canals-normal            Nose- Clear, no-Septal dev, mucus, polyps, erosion, perforation             Throat- Mallampati III , mucosa clear , drainage- none, tonsils- atrophic. +Easy gag. Neck- flexible , trachea midline, no stridor , thyroid nl, carotid no bruit Chest - symmetrical excursion , unlabored           Heart/CV- RRR , no murmur , no gallop  , no rub, nl s1 s2                           -  JVD- none , edema- none, stasis changes- none, varices- none           Lung- clear, wheeze- none, cough- none , dullness-none, rub- none           Chest wall-  Abd-  Br/ Gen/ Rectal- Not done, not indicated Extrem- cyanosis- none, clubbing, none, atrophy- none, strength- nl Neuro- grossly intact to observation

## 2015-11-22 ENCOUNTER — Encounter: Payer: Self-pay | Admitting: Internal Medicine

## 2015-11-22 NOTE — Telephone Encounter (Signed)
I called and discussed Mrs Witthuhn's concerns. She had begun coughing with MTX and then Areva, so we tried switch first to [place] and now to [employer]. She blames humidity for increased dyspnea, and notices cough especially on coming from heat into [facility]. Discussed her concerns about dx asthma, appropriate indication and risk consideration for Anoro, and alternatives. CXR in April had not shown active process.  Plan- she can use up the Anoro then stop and watch, calling us prn.

## 2015-12-13 ENCOUNTER — Encounter (HOSPITAL_COMMUNITY)
Admission: RE | Admit: 2015-12-13 | Discharge: 2015-12-13 | Disposition: A | Discharge status: Home / Self Care | Payer: Medicare Other | Source: Ambulatory Visit | Attending: Rheumatology | Admitting: Rheumatology

## 2015-12-13 ENCOUNTER — Encounter: Payer: Self-pay | Admitting: Internal Medicine

## 2015-12-13 DIAGNOSIS — M0579 Rheumatoid arthritis with rheumatoid factor of multiple sites without organ or systems involvement: Secondary | ICD-10-CM | POA: Diagnosis not present

## 2015-12-13 LAB — COMPREHENSIVE METABOLIC PANEL
ALK PHOS: 60 U/L (ref 38–126)
ALT: 15 U/L (ref 14–54)
ANION GAP: 8 (ref 5–15)
AST: 21 U/L (ref 15–41)
Albumin: 3.9 g/dL (ref 3.5–5.0)
BILIRUBIN TOTAL: 0.5 mg/dL (ref 0.3–1.2)
BUN: 14 mg/dL (ref 6–20)
CALCIUM: 8.6 mg/dL — AB (ref 8.9–10.3)
CO2: 25 mmol/L (ref 22–32)
Chloride: 104 mmol/L (ref 101–111)
Creatinine, Ser: 0.68 mg/dL (ref 0.44–1.00)
Glucose, Bld: 95 mg/dL (ref 65–99)
Potassium: 4.1 mmol/L (ref 3.5–5.1)
Sodium: 137 mmol/L (ref 135–145)
TOTAL PROTEIN: 7 g/dL (ref 6.5–8.1)

## 2015-12-13 LAB — DIFFERENTIAL
BASOS ABS: 0.1 10*3/uL (ref 0.0–0.1)
BASOS PCT: 1 %
Eosinophils Absolute: 0.3 10*3/uL (ref 0.0–0.7)
Eosinophils Relative: 5 %
Lymphocytes Relative: 27 %
Lymphs Abs: 1.6 10*3/uL (ref 0.7–4.0)
MONO ABS: 0.8 10*3/uL (ref 0.1–1.0)
Monocytes Relative: 12 %
NEUTROS ABS: 3.4 10*3/uL (ref 1.7–7.7)
NEUTROS PCT: 55 %

## 2015-12-13 LAB — CBC
HEMATOCRIT: 38.5 % (ref 36.0–46.0)
HEMOGLOBIN: 13 g/dL (ref 12.0–15.0)
MCH: 32.2 pg (ref 26.0–34.0)
MCHC: 33.8 g/dL (ref 30.0–36.0)
MCV: 95.3 fL (ref 78.0–100.0)
Platelets: 343 10*3/uL (ref 150–400)
RBC: 4.04 MIL/uL (ref 3.87–5.11)
RDW: 13.2 % (ref 11.5–15.5)
WBC: 6 10*3/uL (ref 4.0–10.5)

## 2015-12-13 MED ORDER — SODIUM CHLORIDE 0.9 % IV SOLN
212.0000 mg | Freq: Once | INTRAVENOUS | Status: AC
  Administered 2015-12-13: 212 mg via INTRAVENOUS
  Filled 2015-12-13: qty 4

## 2015-12-13 MED ORDER — SODIUM CHLORIDE 0.9 % IV SOLN
Freq: Once | INTRAVENOUS | Status: AC
  Administered 2015-12-13: 10:00:00 via INTRAVENOUS

## 2015-12-13 NOTE — Progress Notes (Signed)
Results for Monica [name], Monica [name] (MRN [id number]) as of 12/13/2015 15:29  Ref. Range 12/13/2015 11:27  Sodium Latest Ref Range: 135 - 145 mmol/L 137  Potassium Latest Ref Range: 3.5 - 5.1 mmol/L 4.1  Chloride Latest Ref Range: 101 - 111 mmol/L 104  CO2 Latest Ref Range: 22 - 32 mmol/L 25  BUN Latest Ref Range: 6 - 20 mg/dL 14  Creatinine Latest Ref Range: 0.44 - 1.00 mg/dL 0.68  Calcium Latest Ref Range: 8.9 - 10.3 mg/dL 8.6 (L)  EGFR (Non-African Amer.) Latest Ref Range: >60 mL/min >60  EGFR (African American) Latest Ref Range: >60 mL/min >60  Glucose Latest Ref Range: 65 - 99 mg/dL 95  Anion gap Latest Ref Range: 5 - 15  8  Alkaline Phosphatase Latest Ref Range: 38 - 126 U/L 60  Albumin Latest Ref Range: 3.5 - 5.0 g/dL 3.9  AST Latest Ref Range: 15 - 41 U/L 21  ALT Latest Ref Range: 14 - 54 U/L 15  Total Protein Latest Ref Range: 6.5 - 8.1 g/dL 7.0  Total Bilirubin Latest Ref Range: 0.3 - 1.2 mg/dL 0.5  WBC Latest Ref Range: 4.0 - 10.5 K/uL 6.0  RBC Latest Ref Range: 3.87 - 5.11 MIL/uL 4.04  Hemoglobin Latest Ref Range: 12.0 - 15.0 g/dL 13.0  HCT Latest Ref Range: 36.0 - 46.0 % 38.5  MCV Latest Ref Range: 78.0 - 100.0 fL 95.3  MCH Latest Ref Range: 26.0 - 34.0 pg 32.2  MCHC Latest Ref Range: 30.0 - 36.0 g/dL 33.8  RDW Latest Ref Range: 11.5 - 15.5 % 13.2  Platelets Latest Ref Range: 150 - 400 K/uL 343  Neutrophils Latest Units: % 55  Lymphocytes Latest Units: % 27  Monocytes Relative Latest Units: % 12  Eosinophil Latest Units: % 5  Basophil Latest Units: % 1  NEUT# Latest Ref Range: 1.7 - 7.7 K/uL 3.4  Lymphocyte # Latest Ref Range: 0.7 - 4.0 K/uL 1.6  Monocyte # Latest Ref Range: 0.1 - 1.0 K/uL 0.8  Eosinophils Absolute Latest Ref Range: 0.0 - 0.7 K/uL 0.3  Basophils Absolute Latest Ref Range: 0.0 - 0.1 K/uL 0.1

## 2015-12-13 NOTE — Telephone Encounter (Signed)
Pt would like to know if she should continue Anoro and if so for how long. Please see MyChart Email.   CY Please advise. Thanks!   Allergies  Allergen Reactions  . Piroxicam     REACTION: effected eyes    Current Outpatient Prescriptions on File Prior to Visit  Medication Sig Dispense Refill  . acetaminophen (TYLENOL) 650 MG CR tablet Take 500 mg by mouth. 2 tablet 3 times a day    . cholecalciferol (VITAMIN D) 1000 UNITS tablet Take 2,000 Units by mouth daily.    . fluticasone furoate-vilanterol (BREO ELLIPTA) 100-25 MCG/INH AEPB Inhale one puff, then rinse mouth, once daily 60 each 12  . folic acid (FOLVITE) 1 MG tablet Take 1 mg by mouth daily.    . furosemide (LASIX) 20 MG tablet Take 20 mg by mouth daily as needed.    . hydrochlorothiazide (HYDRODIURIL) 25 MG tablet Take 25 mg by mouth daily.      . hydroxychloroquine (PLAQUENIL) 200 MG tablet Take 350 mg by mouth daily.    Marland Kitchen ibuprofen (ADVIL,MOTRIN) 200 MG tablet Take 200 mg by mouth every 6 (six) hours as needed.    . lovastatin (MEVACOR) 20 MG tablet Take 20 mg by mouth at bedtime.      . sertraline (ZOLOFT) 50 MG tablet Take 25 mg by mouth daily.     Marland Kitchen Spacer/Aero Chamber Mouthpiece MISC by Does not apply route.      . umeclidinium-vilanterol (ANORO ELLIPTA) 62.5-25 MCG/INH AEPB Inhale 1 puff deeply, once daily 60 each 12  . VENTOLIN HFA 108 (90 BASE) MCG/ACT inhaler INHALE 1-2 PUFFS EVERY 4 HOURS AS NEEDED FOR SHORTNESS OF BREATH/WHEEZING. 18 g PRN   No current facility-administered medications on file prior to visit.

## 2015-12-13 NOTE — Telephone Encounter (Signed)
We are just trying to learn what types of medicines help you the most, regardless of what name we put on your condition. If the Anoro keeps working, we can continue it. You can help [place] decide on a month by month basis, so I would suggest refilling it for now. See what you think over time.

## 2015-12-20 MED ORDER — UMECLIDINIUM-VILANTEROL 62.5-25 MCG/INH IN AEPB: INHALATION_SPRAY | RESPIRATORY_TRACT | 12 refills | Status: DC

## 2015-12-20 NOTE — Addendum Note (Signed)
Addended by: Tommie Sams on: 12/20/2015 10:14 AM   Modules accepted: Orders

## 2016-01-10 DIAGNOSIS — M0579 Rheumatoid arthritis with rheumatoid factor of multiple sites without organ or systems involvement: Secondary | ICD-10-CM

## 2016-01-11 DIAGNOSIS — Z23 Encounter for immunization: Secondary | ICD-10-CM | POA: Diagnosis not present

## 2016-01-11 DIAGNOSIS — E7800 Pure hypercholesterolemia, unspecified: Secondary | ICD-10-CM | POA: Diagnosis not present

## 2016-01-11 DIAGNOSIS — D72829 Elevated white blood cell count, unspecified: Secondary | ICD-10-CM | POA: Diagnosis not present

## 2016-02-07 ENCOUNTER — Encounter (HOSPITAL_COMMUNITY)
Admission: RE | Admit: 2016-02-07 | Discharge: 2016-02-07 | Disposition: A | Discharge status: Home / Self Care | Payer: Medicare Other | Source: Ambulatory Visit | Attending: Rheumatology | Admitting: Rheumatology

## 2016-02-07 ENCOUNTER — Encounter (HOSPITAL_COMMUNITY): Payer: Self-pay

## 2016-02-07 DIAGNOSIS — M0579 Rheumatoid arthritis with rheumatoid factor of multiple sites without organ or systems involvement: Secondary | ICD-10-CM | POA: Diagnosis not present

## 2016-02-07 LAB — CBC
HCT: 38.7 % (ref 36.0–46.0)
Hemoglobin: 12.9 g/dL (ref 12.0–15.0)
MCH: 31.9 pg (ref 26.0–34.0)
MCHC: 33.3 g/dL (ref 30.0–36.0)
MCV: 95.8 fL (ref 78.0–100.0)
PLATELETS: 337 10*3/uL (ref 150–400)
RBC: 4.04 MIL/uL (ref 3.87–5.11)
RDW: 13.1 % (ref 11.5–15.5)
WBC: 9.4 10*3/uL (ref 4.0–10.5)

## 2016-02-07 LAB — COMPREHENSIVE METABOLIC PANEL
ALT: 13 U/L — ABNORMAL LOW (ref 14–54)
ANION GAP: 7 (ref 5–15)
AST: 22 U/L (ref 15–41)
Albumin: 3.9 g/dL (ref 3.5–5.0)
Alkaline Phosphatase: 60 U/L (ref 38–126)
BILIRUBIN TOTAL: 0.4 mg/dL (ref 0.3–1.2)
BUN: 20 mg/dL (ref 6–20)
CHLORIDE: 103 mmol/L (ref 101–111)
CO2: 25 mmol/L (ref 22–32)
Calcium: 9.3 mg/dL (ref 8.9–10.3)
Creatinine, Ser: 0.72 mg/dL (ref 0.44–1.00)
GFR calc Af Amer: >60 mL/min (ref >60)
Glucose, Bld: 139 mg/dL — ABNORMAL HIGH (ref 65–99)
POTASSIUM: 4.1 mmol/L (ref 3.5–5.1)
Sodium: 135 mmol/L (ref 135–145)
TOTAL PROTEIN: 7.1 g/dL (ref 6.5–8.1)

## 2016-02-07 MED ORDER — ACETAMINOPHEN 325 MG PO TABS: 650.0000 mg | ORAL_TABLET | Freq: Once | ORAL | Status: DC

## 2016-02-07 MED ORDER — SODIUM CHLORIDE 0.9 % IV SOLN
Freq: Once | INTRAVENOUS | Status: AC
  Administered 2016-02-07: 10:00:00 via INTRAVENOUS

## 2016-02-07 MED ORDER — DIPHENHYDRAMINE HCL 25 MG PO CAPS: 25.0000 mg | ORAL_CAPSULE | Freq: Once | ORAL | Status: DC

## 2016-02-07 MED ORDER — SODIUM CHLORIDE 0.9 % IV SOLN
220.0000 mg | Freq: Once | INTRAVENOUS | Status: AC
  Administered 2016-02-07: 220 mg via INTRAVENOUS
  Filled 2016-02-07: qty 17.6

## 2016-02-07 NOTE — Progress Notes (Signed)
Results for Monica [name], Monica [name] (MRN [id number]) as of 02/07/2016 13:50  Ref. Range 02/07/2016 10:15  Sodium Latest Ref Range: 135 - 145 mmol/L 135  Potassium Latest Ref Range: 3.5 - 5.1 mmol/L 4.1  Chloride Latest Ref Range: 101 - 111 mmol/L 103  CO2 Latest Ref Range: 22 - 32 mmol/L 25  BUN Latest Ref Range: 6 - 20 mg/dL 20  Creatinine Latest Ref Range: 0.44 - 1.00 mg/dL 0.72  Calcium Latest Ref Range: 8.9 - 10.3 mg/dL 9.3  EGFR (Non-African Amer.) Latest Ref Range: >60 mL/min >60  EGFR (African American) Latest Ref Range: >60 mL/min >60  Glucose Latest Ref Range: 65 - 99 mg/dL 139 (H)  Anion gap Latest Ref Range: 5 - 15  7  Alkaline Phosphatase Latest Ref Range: 38 - 126 U/L 60  Albumin Latest Ref Range: 3.5 - 5.0 g/dL 3.9  AST Latest Ref Range: 15 - 41 U/L 22  ALT Latest Ref Range: 14 - 54 U/L 13 (L)  Total Protein Latest Ref Range: 6.5 - 8.1 g/dL 7.1  Total Bilirubin Latest Ref Range: 0.3 - 1.2 mg/dL 0.4  WBC Latest Ref Range: 4.0 - 10.5 K/uL 9.4  RBC Latest Ref Range: 3.87 - 5.11 MIL/uL 4.04  Hemoglobin Latest Ref Range: 12.0 - 15.0 g/dL 12.9  HCT Latest Ref Range: 36.0 - 46.0 % 38.7  MCV Latest Ref Range: 78.0 - 100.0 fL 95.8  MCH Latest Ref Range: 26.0 - 34.0 pg 31.9  MCHC Latest Ref Range: 30.0 - 36.0 g/dL 33.3  RDW Latest Ref Range: 11.5 - 15.5 % 13.1  Platelets Latest Ref Range: 150 - 400 K/uL 337

## 2016-02-08 ENCOUNTER — Ambulatory Visit (INDEPENDENT_AMBULATORY_CARE_PROVIDER_SITE_OTHER): Payer: Medicare Other | Admitting: Rheumatology

## 2016-02-08 DIAGNOSIS — M79642 Pain in left hand: Secondary | ICD-10-CM

## 2016-02-08 DIAGNOSIS — M79672 Pain in left foot: Secondary | ICD-10-CM

## 2016-02-08 DIAGNOSIS — M79671 Pain in right foot: Secondary | ICD-10-CM | POA: Diagnosis not present

## 2016-02-08 DIAGNOSIS — M79641 Pain in right hand: Secondary | ICD-10-CM

## 2016-02-08 DIAGNOSIS — M0579 Rheumatoid arthritis with rheumatoid factor of multiple sites without organ or systems involvement: Secondary | ICD-10-CM | POA: Diagnosis not present

## 2016-02-08 DIAGNOSIS — Z09 Encounter for follow-up examination after completed treatment for conditions other than malignant neoplasm: Secondary | ICD-10-CM | POA: Diagnosis not present

## 2016-02-17 NOTE — Assessment & Plan Note (Signed)
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 We again discussed reflux precautions and emphasized watching for specific triggers and situations such as postnasal drip. Plan- try changing Breo to Anoro to assess LAMA effect. Standard advice re soothing throat lozenges, no peppermint.

## 2016-03-01 ENCOUNTER — Ambulatory Visit: Payer: Medicare Other | Admitting: Rheumatology

## 2016-03-07 ENCOUNTER — Other Ambulatory Visit: Payer: Self-pay | Admitting: Rheumatology

## 2016-03-07 DIAGNOSIS — M0579 Rheumatoid arthritis with rheumatoid factor of multiple sites without organ or systems involvement: Secondary | ICD-10-CM

## 2016-03-07 DIAGNOSIS — Z79899 Other long term (current) drug therapy: Secondary | ICD-10-CM

## 2016-03-27 ENCOUNTER — Telehealth: Payer: Self-pay | Admitting: Radiology

## 2016-03-27 NOTE — Telephone Encounter (Signed)
Update [facility] infusion orders they are due

## 2016-03-28 ENCOUNTER — Other Ambulatory Visit: Payer: Self-pay | Admitting: Radiology

## 2016-03-28 NOTE — Telephone Encounter (Signed)
Orders are in/ they were done on 03/07/16

## 2016-04-03 ENCOUNTER — Encounter (HOSPITAL_COMMUNITY)
Admission: RE | Admit: 2016-04-03 | Discharge: 2016-04-03 | Disposition: A | Discharge status: Home / Self Care | Payer: Medicare Other | Source: Ambulatory Visit | Attending: Rheumatology | Admitting: Rheumatology

## 2016-04-03 DIAGNOSIS — Z79899 Other long term (current) drug therapy: Secondary | ICD-10-CM

## 2016-04-03 DIAGNOSIS — M0579 Rheumatoid arthritis with rheumatoid factor of multiple sites without organ or systems involvement: Secondary | ICD-10-CM | POA: Insufficient documentation

## 2016-04-03 LAB — COMPREHENSIVE METABOLIC PANEL
ALBUMIN: 4 g/dL (ref 3.5–5.0)
ALK PHOS: 56 U/L (ref 38–126)
ALT: 15 U/L (ref 14–54)
AST: 28 U/L (ref 15–41)
Anion gap: 7 (ref 5–15)
BUN: 11 mg/dL (ref 6–20)
CHLORIDE: 104 mmol/L (ref 101–111)
CO2: 26 mmol/L (ref 22–32)
CREATININE: 0.81 mg/dL (ref 0.44–1.00)
Calcium: 9.3 mg/dL (ref 8.9–10.3)
GFR calc non Af Amer: >60 mL/min (ref >60)
GLUCOSE: 96 mg/dL (ref 65–99)
Potassium: 4.1 mmol/L (ref 3.5–5.1)
SODIUM: 137 mmol/L (ref 135–145)
Total Bilirubin: 1 mg/dL (ref 0.3–1.2)
Total Protein: 7.1 g/dL (ref 6.5–8.1)

## 2016-04-03 LAB — CBC WITH DIFFERENTIAL/PLATELET
Basophils Absolute: 0 10*3/uL (ref 0.0–0.1)
Basophils Relative: 0 %
EOS ABS: 0.1 10*3/uL (ref 0.0–0.7)
Eosinophils Relative: 2 %
HCT: 39 % (ref 36.0–46.0)
HEMOGLOBIN: 13.2 g/dL (ref 12.0–15.0)
LYMPHS ABS: 1.4 10*3/uL (ref 0.7–4.0)
Lymphocytes Relative: 23 %
MCH: 32.3 pg (ref 26.0–34.0)
MCHC: 33.8 g/dL (ref 30.0–36.0)
MCV: 95.4 fL (ref 78.0–100.0)
MONO ABS: 0.7 10*3/uL (ref 0.1–1.0)
MONOS PCT: 11 %
NEUTROS PCT: 64 %
Neutro Abs: 3.9 10*3/uL (ref 1.7–7.7)
Platelets: 290 10*3/uL (ref 150–400)
RBC: 4.09 MIL/uL (ref 3.87–5.11)
RDW: 12.9 % (ref 11.5–15.5)
WBC: 6.2 10*3/uL (ref 4.0–10.5)

## 2016-04-03 MED ORDER — SODIUM CHLORIDE 0.9 % IV SOLN
INTRAVENOUS | Status: DC
  Administered 2016-04-03: 10:00:00 via INTRAVENOUS

## 2016-04-03 MED ORDER — ACETAMINOPHEN 325 MG PO TABS: 650.0000 mg | ORAL_TABLET | Freq: Once | ORAL | Status: DC

## 2016-04-03 MED ORDER — SODIUM CHLORIDE 0.9 % IV SOLN
212.0000 mg | INTRAVENOUS | Status: DC
  Administered 2016-04-03: 212 mg via INTRAVENOUS
  Filled 2016-04-03: qty 16.96

## 2016-04-03 MED ORDER — DIPHENHYDRAMINE HCL 25 MG PO CAPS: 25.0000 mg | ORAL_CAPSULE | Freq: Once | ORAL | Status: DC

## 2016-04-03 NOTE — Progress Notes (Signed)
Results for RIMA, BLIZZARD (MRN [id number]) as of 04/03/2016 10:36  Ref. Range 04/03/2016 10:05  Sodium Latest Ref Range: 135 - 145 mmol/L 137  Potassium Latest Ref Range: 3.5 - 5.1 mmol/L 4.1  Chloride Latest Ref Range: 101 - 111 mmol/L 104  CO2 Latest Ref Range: 22 - 32 mmol/L 26  BUN Latest Ref Range: 6 - 20 mg/dL 11  Creatinine Latest Ref Range: 0.44 - 1.00 mg/dL 0.81  Calcium Latest Ref Range: 8.9 - 10.3 mg/dL 9.3  EGFR (Non-African Amer.) Latest Ref Range: >60 mL/min >60  EGFR (African American) Latest Ref Range: >60 mL/min >60  Glucose Latest Ref Range: 65 - 99 mg/dL 96  Anion gap Latest Ref Range: 5 - 15  7  Alkaline Phosphatase Latest Ref Range: 38 - 126 U/L 56  Albumin Latest Ref Range: 3.5 - 5.0 g/dL 4.0  AST Latest Ref Range: 15 - 41 U/L 28  ALT Latest Ref Range: 14 - 54 U/L 15  Total Protein Latest Ref Range: 6.5 - 8.1 g/dL 7.1  Total Bilirubin Latest Ref Range: 0.3 - 1.2 mg/dL 1.0  WBC Latest Ref Range: 4.0 - 10.5 K/uL 6.2  RBC Latest Ref Range: 3.87 - 5.11 MIL/uL 4.09  Hemoglobin Latest Ref Range: 12.0 - 15.0 g/dL 13.2  HCT Latest Ref Range: 36.0 - 46.0 % 39.0  MCV Latest Ref Range: 78.0 - 100.0 fL 95.4  MCH Latest Ref Range: 26.0 - 34.0 pg 32.3  MCHC Latest Ref Range: 30.0 - 36.0 g/dL 33.8  RDW Latest Ref Range: 11.5 - 15.5 % 12.9  Platelets Latest Ref Range: 150 - 400 K/uL 290  Neutrophils Latest Units: % 64  Lymphocytes Latest Units: % 23  Monocytes Relative Latest Units: % 11  Eosinophil Latest Units: % 2  Basophil Latest Units: % 0  NEUT# Latest Ref Range: 1.7 - 7.7 K/uL 3.9  Lymphocyte # Latest Ref Range: 0.7 - 4.0 K/uL 1.4  Monocyte # Latest Ref Range: 0.1 - 1.0 K/uL 0.7  Eosinophils Absolute Latest Ref Range: 0.0 - 0.7 K/uL 0.1  Basophils Absolute Latest Ref Range: 0.0 - 0.1 K/uL 0.0

## 2016-04-03 NOTE — Progress Notes (Signed)
Pt states she took her tylenol and benadryl at home before coming to hospital. 

## 2016-04-04 NOTE — Progress Notes (Signed)
Labs normal.

## 2016-04-11 DIAGNOSIS — E7800 Pure hypercholesterolemia, unspecified: Secondary | ICD-10-CM | POA: Diagnosis not present

## 2016-04-11 DIAGNOSIS — M069 Rheumatoid arthritis, unspecified: Secondary | ICD-10-CM | POA: Diagnosis not present

## 2016-04-11 DIAGNOSIS — F339 Major depressive disorder, recurrent, unspecified: Secondary | ICD-10-CM | POA: Diagnosis not present

## 2016-04-11 DIAGNOSIS — I10 Essential (primary) hypertension: Secondary | ICD-10-CM | POA: Diagnosis not present

## 2016-04-11 DIAGNOSIS — Z79899 Other long term (current) drug therapy: Secondary | ICD-10-CM | POA: Diagnosis not present

## 2016-04-11 DIAGNOSIS — F419 Anxiety disorder, unspecified: Secondary | ICD-10-CM | POA: Diagnosis not present

## 2016-04-11 DIAGNOSIS — R7301 Impaired fasting glucose: Secondary | ICD-10-CM | POA: Diagnosis not present

## 2016-04-11 DIAGNOSIS — J45909 Unspecified asthma, uncomplicated: Secondary | ICD-10-CM | POA: Diagnosis not present

## 2016-04-17 ENCOUNTER — Other Ambulatory Visit: Payer: Self-pay | Admitting: Rheumatology

## 2016-04-17 NOTE — Telephone Encounter (Signed)
Last Visit: 02/08/16 Next Visit: 05/17/16 Labs: 02/08/16 WNL PLQ Eye Exam: 08/11/15 WNL  Okay to refill PLQ?

## 2016-05-05 ENCOUNTER — Ambulatory Visit: Payer: Medicare Other | Admitting: Internal Medicine

## 2016-05-09 ENCOUNTER — Telehealth: Payer: Self-pay | Admitting: Radiology

## 2016-05-09 NOTE — Telephone Encounter (Signed)
Patient has Medicare and [employer]. Left message with patient to call back  to verify that her insurance has not changed for 2018.  Spoke with representative from insurance to verify coverage and benefits. Patient will not require a prior authorization.   Monica [name], [place], CPhT

## 2016-05-09 NOTE — Telephone Encounter (Signed)
Patient infuses at [facility], can we ck on insurance auth?

## 2016-05-12 ENCOUNTER — Ambulatory Visit: Payer: Medicare Other | Admitting: Internal Medicine

## 2016-05-15 DIAGNOSIS — Z8639 Personal history of other endocrine, nutritional and metabolic disease: Secondary | ICD-10-CM | POA: Insufficient documentation

## 2016-05-15 DIAGNOSIS — M19042 Primary osteoarthritis, left hand: Secondary | ICD-10-CM

## 2016-05-15 DIAGNOSIS — Z79899 Other long term (current) drug therapy: Secondary | ICD-10-CM | POA: Insufficient documentation

## 2016-05-15 DIAGNOSIS — Z8679 Personal history of other diseases of the circulatory system: Secondary | ICD-10-CM | POA: Insufficient documentation

## 2016-05-15 DIAGNOSIS — M19041 Primary osteoarthritis, right hand: Secondary | ICD-10-CM | POA: Insufficient documentation

## 2016-05-15 DIAGNOSIS — E559 Vitamin D deficiency, unspecified: Secondary | ICD-10-CM | POA: Insufficient documentation

## 2016-05-15 DIAGNOSIS — M19071 Primary osteoarthritis, right ankle and foot: Secondary | ICD-10-CM | POA: Insufficient documentation

## 2016-05-15 DIAGNOSIS — M19072 Primary osteoarthritis, left ankle and foot: Secondary | ICD-10-CM

## 2016-05-15 DIAGNOSIS — M170 Bilateral primary osteoarthritis of knee: Secondary | ICD-10-CM | POA: Insufficient documentation

## 2016-05-15 NOTE — Progress Notes (Signed)
Office Visit Note  Patient: Monica [name]             Date of Birth: Feb 13, 1941           MRN: [id number]             PCP: Gerrit Heck, MD Referring: Leighton Ruff, MD Visit Date: 05/17/2016 Occupation: Retired, Glass blower/designer    Subjective:  Pain hands   History of Present Illness: Monica [name] is a 76 y.o. female with history of rheumatoid arthritis and osteoarthritis overlap. She states she's been having a flare with increased pain and discomfort in most of her joints. She describes increased pain in her bilateral hands and her left shoulder.She's been having difficulty moving her left shoulder. The shoulder pain has been going on for 4 days. She has some pain and stiffness in her knee joints ankles and feet. She's been experiencing some paresthesias in her feet as well.  Activities of Daily Living:  Patient reports morning stiffness for 1 hour.   Patient Reports nocturnal pain.  Difficulty dressing/grooming: Denies Difficulty climbing stairs: Reports Difficulty getting out of chair: Reports Difficulty using hands for taps, buttons, cutlery, and/or writing: Reports   Review of Systems  Constitutional: Positive for fatigue. Negative for night sweats, weight gain, weight loss and weakness.  HENT: Negative for mouth sores, trouble swallowing, trouble swallowing, mouth dryness and nose dryness.   Eyes: Positive for dryness. Negative for pain, redness and visual disturbance.  Respiratory: Negative for cough, shortness of breath and difficulty breathing.   Cardiovascular: Positive for hypertension. Negative for chest pain, palpitations, irregular heartbeat and swelling in legs/feet.  Gastrointestinal: Positive for constipation. Negative for blood in stool and diarrhea.  Endocrine: Negative for increased urination.  Genitourinary: Negative for vaginal dryness.  Musculoskeletal: Positive for arthralgias, joint pain, myalgias, morning stiffness and myalgias.  Negative for joint swelling, muscle weakness and muscle tenderness.  Skin: Negative for color change, rash, hair loss, skin tightness, ulcers and sensitivity to sunlight.  Allergic/Immunologic: Negative for susceptible to infections.  Neurological: Negative for dizziness, memory loss and night sweats.  Hematological: Negative for swollen glands.  Psychiatric/Behavioral: Positive for depressed mood and sleep disturbance. The patient is not nervous/anxious.     PMFS History:  Patient Active Problem List   Diagnosis Date Noted  . High risk medication use 05/15/2016  . Primary osteoarthritis of both hands 05/15/2016  . Primary osteoarthritis of both feet 05/15/2016  . Primary osteoarthritis of both knees 05/15/2016  . Vitamin D deficiency 05/15/2016  . History of hypertension 05/15/2016  . History of hyperlipidemia 05/15/2016  . Rheumatoid arthritis with rheumatoid factor of multiple sites without organ or systems involvement ([place]) 01/10/2016  . Seasonal allergic rhinitis 10/03/2011  . HYPERLIPIDEMIA 12/31/2008  . HYPERTENSION 12/31/2008  . Mild persistent asthmatic bronchitis with exacerbation 12/31/2008    Past Medical History:  Diagnosis Date  . Arthritis   . 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 Hyperlipidemia   . Hypertension     Family History  Problem Relation Age of Onset  . Lung cancer Father    Past Surgical History:  Procedure Laterality Date  . CARPAL TUNNEL RELEASE    . FOOT SURGERY     Social History   Social History Narrative  . No narrative on file     Objective: Vital Signs: BP (!) 152/80   Pulse 80   Ht _0  (1.6 m)   Wt 238 lb (  108 kg)   BMI 42.16 kg/m    Physical Exam  Constitutional: She is oriented to person, place, and time. She appears well-developed and well-nourished.  HENT:  Head: Normocephalic and atraumatic.  Eyes: Conjunctivae and EOM are normal.  Neck: Normal range of motion.  Cardiovascular: Normal rate,  regular rhythm, normal heart sounds and intact distal pulses.   Pulmonary/Chest: Effort normal and breath sounds normal.  Abdominal: Soft. Bowel sounds are normal.  Lymphadenopathy:    She has no cervical adenopathy.  Neurological: She is alert and oriented to person, place, and time.  Skin: Skin is warm and dry. Capillary refill takes less than 2 seconds.  Psychiatric: She has a normal mood and affect. Her behavior is normal.  Nursing note and vitals reviewed.    Musculoskeletal Exam: C-spine and thoracic lumbar spine good range of motion she has limited painful range of motion of her left shoulder with abduction limited to 90. She has good range of motion of her elbow joints wrist joints MCPs PIPs DIPs. She does have thickening of all of her PIP/DIP joints. She has tenderness over all PIP joints. She has mild extensor tendon tenosynovitis in her right wrist. Hip joints knee joints ankles MTPs PIPs with good range of motion with no synovitis.  CDAI Exam: CDAI Homunculus Exam:   Tenderness:  LUE: glenohumeral and wrist Right hand: 2nd PIP, 3rd PIP, 4th PIP and 5th PIP Left hand: 2nd PIP and 3rd PIP  Joint Counts:  CDAI Tender Joint count: 8 CDAI Swollen Joint count: 0  Global Assessments:  Patient Global Assessment: 6 Provider Global Assessment: 4  CDAI Calculated Score: 18    Investigation: Findings:  January 2016:  Sed rate was 66.  Rheumatoid factor 39.  Anti-CCP antibody was 166.9.  CK, uric acid, ACE level, TSH were normal, vitamin D was low at 16.  She had labs in December of 2015 which showed CBC, comprehensive, cholesterol and hemoglobin A1c were normal.     07/13/2015  X-rays of bilateral hands, 2 views in the office today, showed bilateral PIP and DIP narrowing, right 2nd and 3rd MCP narrowing without any erosive changes.   Bilateral feet x-rays showed screw in the left 1st MTP, no joint space narrowing, no erosive changes.  She has inferior and posterior calcaneal  spurs consistent with osteoarthritis.   Bilateral knee x-rays showed right moderate and left severe medial compartment narrowing and lateral osteophyte, bilateral severe patellofemoral narrowing consistent with moderate to severe osteoarthritis and severe chondromalacia patella.   07/13/2015 Her Rapid-3 score was 4.7.  That was consistent with high severity.   March 2016:  CBC, comprehensive metabolic panel were normal.  UA showed few bacteria, but no WBCs.  Her ANA was negative.  Complements were normal.  ENA was negative.  Hepatitis panel, G6PD and TB Gold were all within normal limits.  Vitamin D was low at 13.   04/03/2016 CBC normal, CMP normal  Hospital Outpatient Visit on 04/03/2016  Component Date Value Ref Range Status  . Sodium 04/03/2016 137  135 - 145 mmol/L Final  . Potassium 04/03/2016 4.1  3.5 - 5.1 mmol/L Final  . Chloride 04/03/2016 104  101 - 111 mmol/L Final  . CO2 04/03/2016 26  22 - 32 mmol/L Final  . Glucose, Bld 04/03/2016 96  65 - 99 mg/dL Final  . BUN 04/03/2016 11  6 - 20 mg/dL Final  . Creatinine, Ser 04/03/2016 0.81  0.44 - 1.00 mg/dL Final  . Calcium 04/03/2016  9.3  8.9 - 10.3 mg/dL Final  . Total Protein 04/03/2016 7.1  6.5 - 8.1 g/dL Final  . Albumin 04/03/2016 4.0  3.5 - 5.0 g/dL Final  . AST 04/03/2016 28  15 - 41 U/L Final  . ALT 04/03/2016 15  14 - 54 U/L Final  . Alkaline Phosphatase 04/03/2016 56  38 - 126 U/L Final  . Total Bilirubin 04/03/2016 1.0  0.3 - 1.2 mg/dL Final  . GFR calc non Af Amer 04/03/2016 >60  >60 mL/min Final  . GFR calc Af Amer 04/03/2016 >60  >60 mL/min Final   Comment: (NOTE) The eGFR has been calculated using the CKD EPI equation. This calculation has not been validated in all clinical situations. eGFR's persistently <60 mL/min signify possible Chronic Kidney Disease.   . Anion gap 04/03/2016 7  5 - 15 Final  . WBC 04/03/2016 6.2  4.0 - 10.5 K/uL Final  . RBC 04/03/2016 4.09  3.87 - 5.11 MIL/uL Final  . Hemoglobin  04/03/2016 13.2  12.0 - 15.0 g/dL Final  . HCT 04/03/2016 39.0  36.0 - 46.0 % Final  . MCV 04/03/2016 95.4  78.0 - 100.0 fL Final  . MCH 04/03/2016 32.3  26.0 - 34.0 pg Final  . MCHC 04/03/2016 33.8  30.0 - 36.0 g/dL Final  . RDW 04/03/2016 12.9  11.5 - 15.5 % Final  . Platelets 04/03/2016 290  150 - 400 K/uL Final  . Neutrophils Relative % 04/03/2016 64  % Final  . Neutro Abs 04/03/2016 3.9  1.7 - 7.7 K/uL Final  . Lymphocytes Relative 04/03/2016 23  % Final  . Lymphs Abs 04/03/2016 1.4  0.7 - 4.0 K/uL Final  . Monocytes Relative 04/03/2016 11  % Final  . Monocytes Absolute 04/03/2016 0.7  0.1 - 1.0 K/uL Final  . Eosinophils Relative 04/03/2016 2  % Final  . Eosinophils Absolute 04/03/2016 0.1  0.0 - 0.7 K/uL Final  . Basophils Relative 04/03/2016 0  % Final  . Basophils Absolute 04/03/2016 0.0  0.0 - 0.1 K/uL Final     Imaging: No results found.  Speciality Comments: No specialty comments available.    Procedures:  No procedures performed Allergies: Arava [leflunomide] and Piroxicam   Assessment / Plan:     Visit Diagnoses: Rheumatoid arthritis with rheumatoid factor of multiple sites without organ or systems involvement (HCC) - positive rheumatoid factor, positive CCP and elevated sedimentation rate.  She continues to have some joint pain and discomfort. She's been complaining of increased pain in her hands. She has mild extensor tenosynovitis in her right wrist.  High risk medication use - IV Simponi Aria at [facility], Plaquenil( METHOTREXATE caused fatigue and oral ulcers). Labs are stable and she's been getting eye exam on regular basis.  Acute pain of left shoulder: I offered cortisone injection which she declined. Have given her shoulder joint exercises if she has ongoing symptoms she will notify us.  Primary osteoarthritis of both hands: Muscle strengthening exercises and joint protection was discussed.  Primary osteoarthritis of both feet: Proper fitting shoes were  discussed.  Primary osteoarthritis of both knees: Weight loss diet and exercise was discussed.  Vitamin D deficiency: She is on supplements.  History of hypertension: Her blood pressure was elevated today have advised her to monitor that.  History of hyperlipidemia    Orders: No orders of the defined types were placed in this encounter.  No orders of the defined types were placed in this encounter.   Face-to-face time  spent with patient was 35 minutes. 50% of time was spent in counseling and coordination of care.  Follow-Up Instructions: Return in about 5 months (around 10/15/2016) for Rheumatoid arthritis.   Bo Merino, MD  Note - This record has been created using Editor, commissioning.  Chart creation errors have been sought, but may not always  have been located. Such creation errors do not reflect on  the standard of medical care.

## 2016-05-16 ENCOUNTER — Telehealth: Payer: Self-pay

## 2016-05-16 NOTE — Telephone Encounter (Signed)
Spoke with Joann from [employer] Medicare supplement (patients' secondary coverage). Verified that the patient's infusion outpatient service will not require a prior authorization.   Reference: [id number] Phone: [phone]   Markham Dumlao, CPhT  

## 2016-05-17 ENCOUNTER — Encounter: Payer: Self-pay | Admitting: Rheumatology

## 2016-05-17 ENCOUNTER — Ambulatory Visit (INDEPENDENT_AMBULATORY_CARE_PROVIDER_SITE_OTHER): Payer: Medicare Other | Admitting: Rheumatology

## 2016-05-17 VITALS — BP 152/80 | HR 80 | Ht 63.0 in | Wt 238.0 lb

## 2016-05-17 DIAGNOSIS — Z8639 Personal history of other endocrine, nutritional and metabolic disease: Secondary | ICD-10-CM

## 2016-05-17 DIAGNOSIS — M19071 Primary osteoarthritis, right ankle and foot: Secondary | ICD-10-CM | POA: Diagnosis not present

## 2016-05-17 DIAGNOSIS — M170 Bilateral primary osteoarthritis of knee: Secondary | ICD-10-CM

## 2016-05-17 DIAGNOSIS — M19042 Primary osteoarthritis, left hand: Secondary | ICD-10-CM

## 2016-05-17 DIAGNOSIS — M19041 Primary osteoarthritis, right hand: Secondary | ICD-10-CM

## 2016-05-17 DIAGNOSIS — Z8679 Personal history of other diseases of the circulatory system: Secondary | ICD-10-CM | POA: Diagnosis not present

## 2016-05-17 DIAGNOSIS — M19072 Primary osteoarthritis, left ankle and foot: Secondary | ICD-10-CM | POA: Diagnosis not present

## 2016-05-17 DIAGNOSIS — M0579 Rheumatoid arthritis with rheumatoid factor of multiple sites without organ or systems involvement: Secondary | ICD-10-CM

## 2016-05-17 DIAGNOSIS — Z79899 Other long term (current) drug therapy: Secondary | ICD-10-CM

## 2016-05-17 DIAGNOSIS — E559 Vitamin D deficiency, unspecified: Secondary | ICD-10-CM

## 2016-05-17 DIAGNOSIS — M25512 Pain in left shoulder: Secondary | ICD-10-CM | POA: Diagnosis not present

## 2016-05-17 NOTE — Patient Instructions (Signed)
Supplements for OA Natural anti-inflammatories  You can purchase these at [employer], [employer] or online.  . Turmeric (capsules)  . Ginger (ginger root or capsules)  . Omega 3 (Fish, flax seeds, chia seeds, walnuts, almonds)  . Tart cherry (dried or extract)   Patient should be under the care of a physician while taking these supplements. This may not be reproduced without the permission of Dr. Bo Merino.  Shoulder Exercises Ask your health care provider which exercises are safe for you. Do exercises exactly as told by your health care provider and adjust them as directed. It is normal to feel mild stretching, pulling, tightness, or discomfort as you do these exercises, but you should stop right away if you feel sudden pain or your pain gets worse.Do not begin these exercises until told by your health care provider. RANGE OF MOTION EXERCISES  These exercises warm up your muscles and joints and improve the movement and flexibility of your shoulder. These exercises also help to relieve pain, numbness, and tingling. These exercises involve stretching your injured shoulder directly. Exercise A: Pendulum  1. Stand near a wall or a surface that you can hold onto for balance. 2. Bend at the waist and let your left / right arm hang straight down. Use your other arm to support you. Keep your back straight and do not lock your knees. 3. Relax your left / right arm and shoulder muscles, and move your hips and your trunk so your left / right arm swings freely. Your arm should swing because of the motion of your body, not because you are using your arm or shoulder muscles. 4. Keep moving your body so your arm swings in the following directions, as told by your health care provider:  Side to side.  Forward and backward.  In clockwise and counterclockwise circles. 5. Continue each motion for __________ seconds, or for as long as told by your health care provider. 6. Slowly return to  the starting position. Repeat __________ times. Complete this exercise __________ times a day. Exercise B:Flexion, Standing  1. Stand and hold a broomstick, a cane, or a similar object. Place your hands a little more than shoulder-width apart on the object. Your left / right hand should be palm-up, and your other hand should be palm-down. 2. Keep your elbow straight and keep your shoulder muscles relaxed. Push the stick down with your healthy arm to raise your left / right arm in front of your body, and then over your head until you feel a stretch in your shoulder.  Avoid shrugging your shoulder while you raise your arm. Keep your shoulder blade tucked down toward the middle of your back. 3. Hold for __________ seconds. 4. Slowly return to the starting position. Repeat __________ times. Complete this exercise __________ times a day. Exercise C: Abduction, Standing 1. Stand and hold a broomstick, a cane, or a similar object. Place your hands a little more than shoulder-width apart on the object. Your left / right hand should be palm-up, and your other hand should be palm-down. 2. While keeping your elbow straight and your shoulder muscles relaxed, push the stick across your body toward your left / right side. Raise your left / right arm to the side of your body and then over your head until you feel a stretch in your shoulder.  Do not raise your arm above shoulder height, unless your health care provider tells you to do that.  Avoid shrugging your shoulder while you raise  your arm. Keep your shoulder blade tucked down toward the middle of your back. 3. Hold for __________ seconds. 4. Slowly return to the starting position. Repeat __________ times. Complete this exercise __________ times a day. Exercise D:Internal Rotation  1. Place your left / right hand behind your back, palm-up. 2. Use your other hand to dangle an exercise band, a towel, or a similar object over your shoulder. Grasp the band  with your left / right hand so you are holding onto both ends. 3. Gently pull up on the band until you feel a stretch in the front of your left / right shoulder.  Avoid shrugging your shoulder while you raise your arm. Keep your shoulder blade tucked down toward the middle of your back. 4. Hold for __________ seconds. 5. Release the stretch by letting go of the band and lowering your hands. Repeat __________ times. Complete this exercise __________ times a day. STRETCHING EXERCISES  These exercises warm up your muscles and joints and improve the movement and flexibility of your shoulder. These exercises also help to relieve pain, numbness, and tingling. These exercises are done using your healthy shoulder to help stretch the muscles of your injured shoulder. Exercise E: Warehouse manager (External Rotation and Abduction)  1. Stand in a doorway with one of your feet slightly in front of the other. This is called a staggered stance. If you cannot reach your forearms to the door frame, stand facing a corner of a room. 2. Choose one of the following positions as told by your health care provider:  Place your hands and forearms on the door frame above your head.  Place your hands and forearms on the door frame at the height of your head.  Place your hands on the door frame at the height of your elbows. 3. Slowly move your weight onto your front foot until you feel a stretch across your chest and in the front of your shoulders. Keep your head and chest upright and keep your abdominal muscles tight. 4. Hold for __________ seconds. 5. To release the stretch, shift your weight to your back foot. Repeat __________ times. Complete this stretch __________ times a day. Exercise F:Extension, Standing 1. Stand and hold a broomstick, a cane, or a similar object behind your back.  Your hands should be a little wider than shoulder-width apart.  Your palms should face away from your back. 2. Keeping your  elbows straight and keeping your shoulder muscles relaxed, move the stick away from your body until you feel a stretch in your shoulder.  Avoid shrugging your shoulders while you move the stick. Keep your shoulder blade tucked down toward the middle of your back. 3. Hold for __________ seconds. 4. Slowly return to the starting position. Repeat __________ times. Complete this exercise __________ times a day. STRENGTHENING EXERCISES  These exercises build strength and endurance in your shoulder. Endurance is the ability to use your muscles for a long time, even after they get tired. Exercise G:External Rotation  1. Sit in a stable chair without armrests. 2. Secure an exercise band at elbow height on your left / right side. 3. Place a soft object, such as a folded towel or a small pillow, between your left / right upper arm and your body to move your elbow a few inches away (about 10 cm) from your side. 4. Hold the end of the band so it is tight and there is no slack. 5. Keeping your elbow pressed against the soft  object, move your left / right forearm out, away from your abdomen. Keep your body steady so only your forearm moves. 6. Hold for __________ seconds. 7. Slowly return to the starting position. Repeat __________ times. Complete this exercise __________ times a day. Exercise H:Shoulder Abduction  1. Sit in a stable chair without armrests, or stand. 2. Hold a __________ weight in your left / right hand, or hold an exercise band with both hands. 3. Start with your arms straight down and your left / right palm facing in, toward your body. 4. Slowly lift your left / right hand out to your side. Do not lift your hand above shoulder height unless your health care provider tells you that this is safe.  Keep your arms straight.  Avoid shrugging your shoulder while you do this movement. Keep your shoulder blade tucked down toward the middle of your back. 5. Hold for __________  seconds. 6. Slowly lower your arm, and return to the starting position. Repeat __________ times. Complete this exercise __________ times a day. Exercise I:Shoulder Extension 1. Sit in a stable chair without armrests, or stand. 2. Secure an exercise band to a stable object in front of you where it is at shoulder height. 3. Hold one end of the exercise band in each hand. Your palms should face each other. 4. Straighten your elbows and lift your hands up to shoulder height. 5. Step back, away from the secured end of the exercise band, until the band is tight and there is no slack. 6. Squeeze your shoulder blades together as you pull your hands down to the sides of your thighs. Stop when your hands are straight down by your sides. Do not let your hands go behind your body. 7. Hold for __________ seconds. 8. Slowly return to the starting position. Repeat __________ times. Complete this exercise __________ times a day. Exercise J:Standing Shoulder Row 1. Sit in a stable chair without armrests, or stand. 2. Secure an exercise band to a stable object in front of you so it is at waist height. 3. Hold one end of the exercise band in each hand. Your palms should be in a thumbs-up position. 4. Bend each of your elbows to an "L" shape (about 90 degrees) and keep your upper arms at your sides. 5. Step back until the band is tight and there is no slack. 6. Slowly pull your elbows back behind you. 7. Hold for __________ seconds. 8. Slowly return to the starting position. Repeat __________ times. Complete this exercise __________ times a day. Exercise K:Shoulder Press-Ups  1. Sit in a stable chair that has armrests. Sit upright, with your feet flat on the floor. 2. Put your hands on the armrests so your elbows are bent and your fingers are pointing forward. Your hands should be about even with the sides of your body. 3. Push down on the armrests and use your arms to lift yourself off of the chair.  Straighten your elbows and lift yourself up as much as you comfortably can.  Move your shoulder blades down, and avoid letting your shoulders move up toward your ears.  Keep your feet on the ground. As you get stronger, your feet should support less of your body weight as you lift yourself up. 4. Hold for __________ seconds. 5. Slowly lower yourself back into the chair. Repeat __________ times. Complete this exercise __________ times a day. Exercise L: Wall Push-Ups  1. Stand so you are facing a stable wall. Your feet  should be about one arm-length away from the wall. 2. Lean forward and place your palms on the wall at shoulder height. 3. Keep your feet flat on the floor as you bend your elbows and lean forward toward the wall. 4. Hold for __________ seconds. 5. Straighten your elbows to push yourself back to the starting position. Repeat __________ times. Complete this exercise __________ times a day. This information is not intended to replace advice given to you by your health care provider. Make sure you discuss any questions you have with your health care provider. Document Released: 03/08/2005 Document Revised: 01/17/2016 Document Reviewed: 01/03/2015 Elsevier Interactive Patient Education  2017 [employer].

## 2016-05-25 ENCOUNTER — Ambulatory Visit: Payer: Medicare Other | Admitting: Internal Medicine

## 2016-05-26 ENCOUNTER — Other Ambulatory Visit: Payer: Self-pay | Admitting: Rheumatology

## 2016-05-26 ENCOUNTER — Other Ambulatory Visit: Payer: Self-pay | Admitting: Internal Medicine

## 2016-05-26 DIAGNOSIS — M0579 Rheumatoid arthritis with rheumatoid factor of multiple sites without organ or systems involvement: Secondary | ICD-10-CM

## 2016-05-29 ENCOUNTER — Encounter (HOSPITAL_COMMUNITY)
Admission: RE | Admit: 2016-05-29 | Discharge: 2016-05-29 | Disposition: A | Discharge status: Home / Self Care | Payer: Medicare Other | Source: Ambulatory Visit | Attending: Rheumatology | Admitting: Rheumatology

## 2016-05-29 ENCOUNTER — Encounter (HOSPITAL_COMMUNITY): Payer: Self-pay

## 2016-05-29 DIAGNOSIS — M069 Rheumatoid arthritis, unspecified: Secondary | ICD-10-CM | POA: Diagnosis not present

## 2016-05-29 DIAGNOSIS — M0579 Rheumatoid arthritis with rheumatoid factor of multiple sites without organ or systems involvement: Secondary | ICD-10-CM

## 2016-05-29 LAB — COMPREHENSIVE METABOLIC PANEL
ALT: 12 U/L — ABNORMAL LOW (ref 14–54)
ANION GAP: 7 (ref 5–15)
AST: 20 U/L (ref 15–41)
Albumin: 3.8 g/dL (ref 3.5–5.0)
Alkaline Phosphatase: 58 U/L (ref 38–126)
BILIRUBIN TOTAL: 0.6 mg/dL (ref 0.3–1.2)
BUN: 16 mg/dL (ref 6–20)
CALCIUM: 9.2 mg/dL (ref 8.9–10.3)
CO2: 27 mmol/L (ref 22–32)
Chloride: 103 mmol/L (ref 101–111)
Creatinine, Ser: 0.69 mg/dL (ref 0.44–1.00)
GFR calc Af Amer: >60 mL/min (ref >60)
Glucose, Bld: 87 mg/dL (ref 65–99)
POTASSIUM: 3.8 mmol/L (ref 3.5–5.1)
Sodium: 137 mmol/L (ref 135–145)
TOTAL PROTEIN: 7 g/dL (ref 6.5–8.1)

## 2016-05-29 LAB — CBC WITH DIFFERENTIAL/PLATELET
Basophils Absolute: 0 10*3/uL (ref 0.0–0.1)
Basophils Relative: 0 %
Eosinophils Absolute: 0.2 10*3/uL (ref 0.0–0.7)
Eosinophils Relative: 3 %
HEMATOCRIT: 38.7 % (ref 36.0–46.0)
Hemoglobin: 13 g/dL (ref 12.0–15.0)
LYMPHS ABS: 1.6 10*3/uL (ref 0.7–4.0)
LYMPHS PCT: 22 %
MCH: 31.9 pg (ref 26.0–34.0)
MCHC: 33.6 g/dL (ref 30.0–36.0)
MCV: 95.1 fL (ref 78.0–100.0)
MONO ABS: 0.6 10*3/uL (ref 0.1–1.0)
MONOS PCT: 8 %
NEUTROS ABS: 4.9 10*3/uL (ref 1.7–7.7)
Neutrophils Relative %: 67 %
Platelets: 319 10*3/uL (ref 150–400)
RBC: 4.07 MIL/uL (ref 3.87–5.11)
RDW: 12.7 % (ref 11.5–15.5)
WBC: 7.4 10*3/uL (ref 4.0–10.5)

## 2016-05-29 MED ORDER — DIPHENHYDRAMINE HCL 25 MG PO CAPS: 25.0000 mg | ORAL_CAPSULE | ORAL | Status: DC

## 2016-05-29 MED ORDER — ACETAMINOPHEN 325 MG PO TABS: 650.0000 mg | ORAL_TABLET | ORAL | Status: DC

## 2016-05-29 MED ORDER — SODIUM CHLORIDE 0.9 % IV SOLN
2.0000 mg/kg | INTRAVENOUS | Status: DC
  Administered 2016-05-29: 216.25 mg via INTRAVENOUS
  Filled 2016-05-29: qty 17.3

## 2016-05-29 NOTE — Progress Notes (Signed)
Labs normal.

## 2016-05-29 NOTE — Progress Notes (Signed)
Results for Monica [name], Monica [name] (MRN [id number]) as of 05/29/2016 12:45  Ref. Range 05/29/2016 10:00  COMPREHENSIVE METABOLIC PANEL Unknown Rpt (A)  Sodium Latest Ref Range: 135 - 145 mmol/L 137  Potassium Latest Ref Range: 3.5 - 5.1 mmol/L 3.8  Chloride Latest Ref Range: 101 - 111 mmol/L 103  CO2 Latest Ref Range: 22 - 32 mmol/L 27  Glucose Latest Ref Range: 65 - 99 mg/dL 87  BUN Latest Ref Range: 6 - 20 mg/dL 16  Creatinine Latest Ref Range: 0.44 - 1.00 mg/dL 0.69  Calcium Latest Ref Range: 8.9 - 10.3 mg/dL 9.2  Anion gap Latest Ref Range: 5 - 15  7  Alkaline Phosphatase Latest Ref Range: 38 - 126 U/L 58  Albumin Latest Ref Range: 3.5 - 5.0 g/dL 3.8  AST Latest Ref Range: 15 - 41 U/L 20  ALT Latest Ref Range: 14 - 54 U/L 12 (L)  Total Protein Latest Ref Range: 6.5 - 8.1 g/dL 7.0  Total Bilirubin Latest Ref Range: 0.3 - 1.2 mg/dL 0.6  EGFR (African American) Latest Ref Range: >60 mL/min >60  EGFR (Non-African Amer.) Latest Ref Range: >60 mL/min >60  WBC Latest Ref Range: 4.0 - 10.5 K/uL 7.4  RBC Latest Ref Range: 3.87 - 5.11 MIL/uL 4.07  Hemoglobin Latest Ref Range: 12.0 - 15.0 g/dL 13.0  HCT Latest Ref Range: 36.0 - 46.0 % 38.7  MCV Latest Ref Range: 78.0 - 100.0 fL 95.1  MCH Latest Ref Range: 26.0 - 34.0 pg 31.9  MCHC Latest Ref Range: 30.0 - 36.0 g/dL 33.6  RDW Latest Ref Range: 11.5 - 15.5 % 12.7  Platelets Latest Ref Range: 150 - 400 K/uL 319  Neutrophils Latest Units: % 67  Lymphocytes Latest Units: % 22  Monocytes Relative Latest Units: % 8  Eosinophil Latest Units: % 3  Basophil Latest Units: % 0  NEUT# Latest Ref Range: 1.7 - 7.7 K/uL 4.9  Lymphocyte # Latest Ref Range: 0.7 - 4.0 K/uL 1.6  Monocyte # Latest Ref Range: 0.1 - 1.0 K/uL 0.6  Eosinophils Absolute Latest Ref Range: 0.0 - 0.7 K/uL 0.2  Basophils Absolute Latest Ref Range: 0.0 - 0.1 K/uL 0.0

## 2016-06-14 ENCOUNTER — Other Ambulatory Visit: Payer: Self-pay | Admitting: Rheumatology

## 2016-06-14 NOTE — Telephone Encounter (Signed)
Last Visit: 05/17/16 Next Visit: 09/13/16 Labs: 05/29/16 WNL  Okay to refill Folic Acid?

## 2016-06-29 ENCOUNTER — Other Ambulatory Visit: Payer: Self-pay | Admitting: Radiology

## 2016-07-15 ENCOUNTER — Other Ambulatory Visit: Payer: Self-pay | Admitting: Rheumatology

## 2016-07-17 NOTE — Telephone Encounter (Signed)
05/29/16 last visit 09/13/16 next visit   Labs 05/29/2016 Eye exam 08/21/15 Ok to refill per Dr Corliss Skains

## 2016-07-18 DIAGNOSIS — H6123 Impacted cerumen, bilateral: Secondary | ICD-10-CM | POA: Diagnosis not present

## 2016-07-19 ENCOUNTER — Other Ambulatory Visit (INDEPENDENT_AMBULATORY_CARE_PROVIDER_SITE_OTHER): Payer: Self-pay | Admitting: Radiology

## 2016-07-19 DIAGNOSIS — M0579 Rheumatoid arthritis with rheumatoid factor of multiple sites without organ or systems involvement: Secondary | ICD-10-CM

## 2016-07-19 MED ORDER — ACETAMINOPHEN 325 MG PO TABS: 650.0000 mg | ORAL_TABLET | ORAL | Status: DC

## 2016-07-19 MED ORDER — GOLIMUMAB 50 MG/4ML IV SOLN: 2.0000 mg/kg | INTRAVENOUS | Status: DC

## 2016-07-19 MED ORDER — DIPHENHYDRAMINE HCL 25 MG PO CAPS: 25.0000 mg | ORAL_CAPSULE | ORAL | Status: DC

## 2016-07-21 ENCOUNTER — Other Ambulatory Visit: Payer: Self-pay | Admitting: Radiology

## 2016-07-21 ENCOUNTER — Telehealth: Payer: Self-pay | Admitting: Rheumatology

## 2016-07-21 DIAGNOSIS — M0579 Rheumatoid arthritis with rheumatoid factor of multiple sites without organ or systems involvement: Secondary | ICD-10-CM

## 2016-07-21 NOTE — Telephone Encounter (Signed)
Patient has a simponi infusion appt on Monday (07/24/2016) at 10am at [facility]. They are needing orders in epic please.

## 2016-07-24 ENCOUNTER — Encounter (HOSPITAL_COMMUNITY): Payer: Self-pay

## 2016-07-24 ENCOUNTER — Encounter (HOSPITAL_COMMUNITY)
Admission: RE | Admit: 2016-07-24 | Discharge: 2016-07-24 | Disposition: A | Discharge status: Home / Self Care | Payer: Medicare Other | Source: Ambulatory Visit | Attending: Rheumatology | Admitting: Rheumatology

## 2016-07-24 DIAGNOSIS — M069 Rheumatoid arthritis, unspecified: Secondary | ICD-10-CM | POA: Diagnosis not present

## 2016-07-24 DIAGNOSIS — M0579 Rheumatoid arthritis with rheumatoid factor of multiple sites without organ or systems involvement: Secondary | ICD-10-CM

## 2016-07-24 LAB — COMPREHENSIVE METABOLIC PANEL
ALK PHOS: 63 U/L (ref 38–126)
ALT: 14 U/L (ref 14–54)
AST: 20 U/L (ref 15–41)
Albumin: 3.8 g/dL (ref 3.5–5.0)
Anion gap: 8 (ref 5–15)
BILIRUBIN TOTAL: 0.6 mg/dL (ref 0.3–1.2)
BUN: 15 mg/dL (ref 6–20)
CALCIUM: 9 mg/dL (ref 8.9–10.3)
CO2: 26 mmol/L (ref 22–32)
CREATININE: 0.69 mg/dL (ref 0.44–1.00)
Chloride: 102 mmol/L (ref 101–111)
Glucose, Bld: 87 mg/dL (ref 65–99)
Potassium: 3.4 mmol/L — ABNORMAL LOW (ref 3.5–5.1)
Sodium: 136 mmol/L (ref 135–145)
TOTAL PROTEIN: 7.3 g/dL (ref 6.5–8.1)

## 2016-07-24 LAB — CBC WITH DIFFERENTIAL/PLATELET
BASOS ABS: 0 10*3/uL (ref 0.0–0.1)
Basophils Relative: 0 %
EOS ABS: 0.3 10*3/uL (ref 0.0–0.7)
EOS PCT: 3 %
HCT: 37.9 % (ref 36.0–46.0)
Hemoglobin: 12.9 g/dL (ref 12.0–15.0)
Lymphocytes Relative: 29 %
Lymphs Abs: 2.2 10*3/uL (ref 0.7–4.0)
MCH: 32.2 pg (ref 26.0–34.0)
MCHC: 34 g/dL (ref 30.0–36.0)
MCV: 94.5 fL (ref 78.0–100.0)
Monocytes Absolute: 0.7 10*3/uL (ref 0.1–1.0)
Monocytes Relative: 10 %
Neutro Abs: 4.4 10*3/uL (ref 1.7–7.7)
Neutrophils Relative %: 58 %
Platelets: 334 10*3/uL (ref 150–400)
RBC: 4.01 MIL/uL (ref 3.87–5.11)
RDW: 13.1 % (ref 11.5–15.5)
WBC: 7.6 10*3/uL (ref 4.0–10.5)

## 2016-07-24 MED ORDER — DIPHENHYDRAMINE HCL 25 MG PO CAPS: 25.0000 mg | ORAL_CAPSULE | ORAL | Status: DC

## 2016-07-24 MED ORDER — ACETAMINOPHEN 325 MG PO TABS: 650.0000 mg | ORAL_TABLET | ORAL | Status: DC

## 2016-07-24 MED ORDER — SODIUM CHLORIDE 0.9 % IV SOLN
2.0000 mg/kg | INTRAVENOUS | Status: DC
  Administered 2016-07-24: 213.75 mg via INTRAVENOUS
  Filled 2016-07-24: qty 17.1

## 2016-07-24 MED ORDER — SODIUM CHLORIDE 0.9 % IV SOLN
INTRAVENOUS | Status: DC
  Administered 2016-07-24: 250 mL via INTRAVENOUS

## 2016-07-24 NOTE — Progress Notes (Signed)
Results for [name], Monica L (MRN [id number]) as of 07/24/2016 13:53  Ref. Range 07/24/2016 10:04  COMPREHENSIVE METABOLIC PANEL Unknown Rpt (A)  Sodium Latest Ref Range: 135 - 145 mmol/L 136  Potassium Latest Ref Range: 3.5 - 5.1 mmol/L 3.4 (L)  Chloride Latest Ref Range: 101 - 111 mmol/L 102  CO2 Latest Ref Range: 22 - 32 mmol/L 26  Glucose Latest Ref Range: 65 - 99 mg/dL 87  BUN Latest Ref Range: 6 - 20 mg/dL 15  Creatinine Latest Ref Range: 0.44 - 1.00 mg/dL 0.69  Calcium Latest Ref Range: 8.9 - 10.3 mg/dL 9.0  Anion gap Latest Ref Range: 5 - 15  8  Alkaline Phosphatase Latest Ref Range: 38 - 126 U/L 63  Albumin Latest Ref Range: 3.5 - 5.0 g/dL 3.8  AST Latest Ref Range: 15 - 41 U/L 20  ALT Latest Ref Range: 14 - 54 U/L 14  Total Protein Latest Ref Range: 6.5 - 8.1 g/dL 7.3  Total Bilirubin Latest Ref Range: 0.3 - 1.2 mg/dL 0.6  EGFR (African American) Latest Ref Range: >60 mL/min >60  EGFR (Non-African Amer.) Latest Ref Range: >60 mL/min >60  WBC Latest Ref Range: 4.0 - 10.5 K/uL 7.6  RBC Latest Ref Range: 3.87 - 5.11 MIL/uL 4.01  Hemoglobin Latest Ref Range: 12.0 - 15.0 g/dL 12.9  HCT Latest Ref Range: 36.0 - 46.0 % 37.9  MCV Latest Ref Range: 78.0 - 100.0 fL 94.5  MCH Latest Ref Range: 26.0 - 34.0 pg 32.2  MCHC Latest Ref Range: 30.0 - 36.0 g/dL 34.0  RDW Latest Ref Range: 11.5 - 15.5 % 13.1  Platelets Latest Ref Range: 150 - 400 K/uL 334  Neutrophils Latest Units: % 58  Lymphocytes Latest Units: % 29  Monocytes Relative Latest Units: % 10  Eosinophil Latest Units: % 3  Basophil Latest Units: % 0  NEUT# Latest Ref Range: 1.7 - 7.7 K/uL 4.4  Lymphocyte # Latest Ref Range: 0.7 - 4.0 K/uL 2.2  Monocyte # Latest Ref Range: 0.1 - 1.0 K/uL 0.7  Eosinophils Absolute Latest Ref Range: 0.0 - 0.7 K/uL 0.3  Basophils Absolute Latest Ref Range: 0.0 - 0.1 K/uL 0.0   

## 2016-07-24 NOTE — Progress Notes (Signed)
Potassium slightly low please notify patient and fax to  PCP

## 2016-07-24 NOTE — Progress Notes (Signed)
normal

## 2016-07-26 ENCOUNTER — Ambulatory Visit: Payer: Medicare Other | Admitting: Internal Medicine

## 2016-07-26 LAB — QUANTIFERON TB GOLD ASSAY (BLOOD)

## 2016-07-26 LAB — QUANTIFERON IN TUBE
QFT TB AG MINUS NIL VALUE: 0 [IU]/mL
QUANTIFERON MITOGEN VALUE: 7.59 IU/mL
QUANTIFERON TB AG VALUE: 0.03 IU/mL
QUANTIFERON TB GOLD: NEGATIVE
Quantiferon Nil Value: 0.03 IU/mL

## 2016-07-26 NOTE — Progress Notes (Signed)
TB gold neg

## 2016-08-01 IMAGING — DX DG CHEST 2V
2 series · 2 of 2 positions shown · non-contrast
Comparison: PA and lateral chest 12/31/2008.

CLINICAL DATA: Recent diagnosis of rheumatoid arthritis. Patient
for immunosuppressive therapy.

EXAM:
CHEST  2 VIEW

[chest pa]
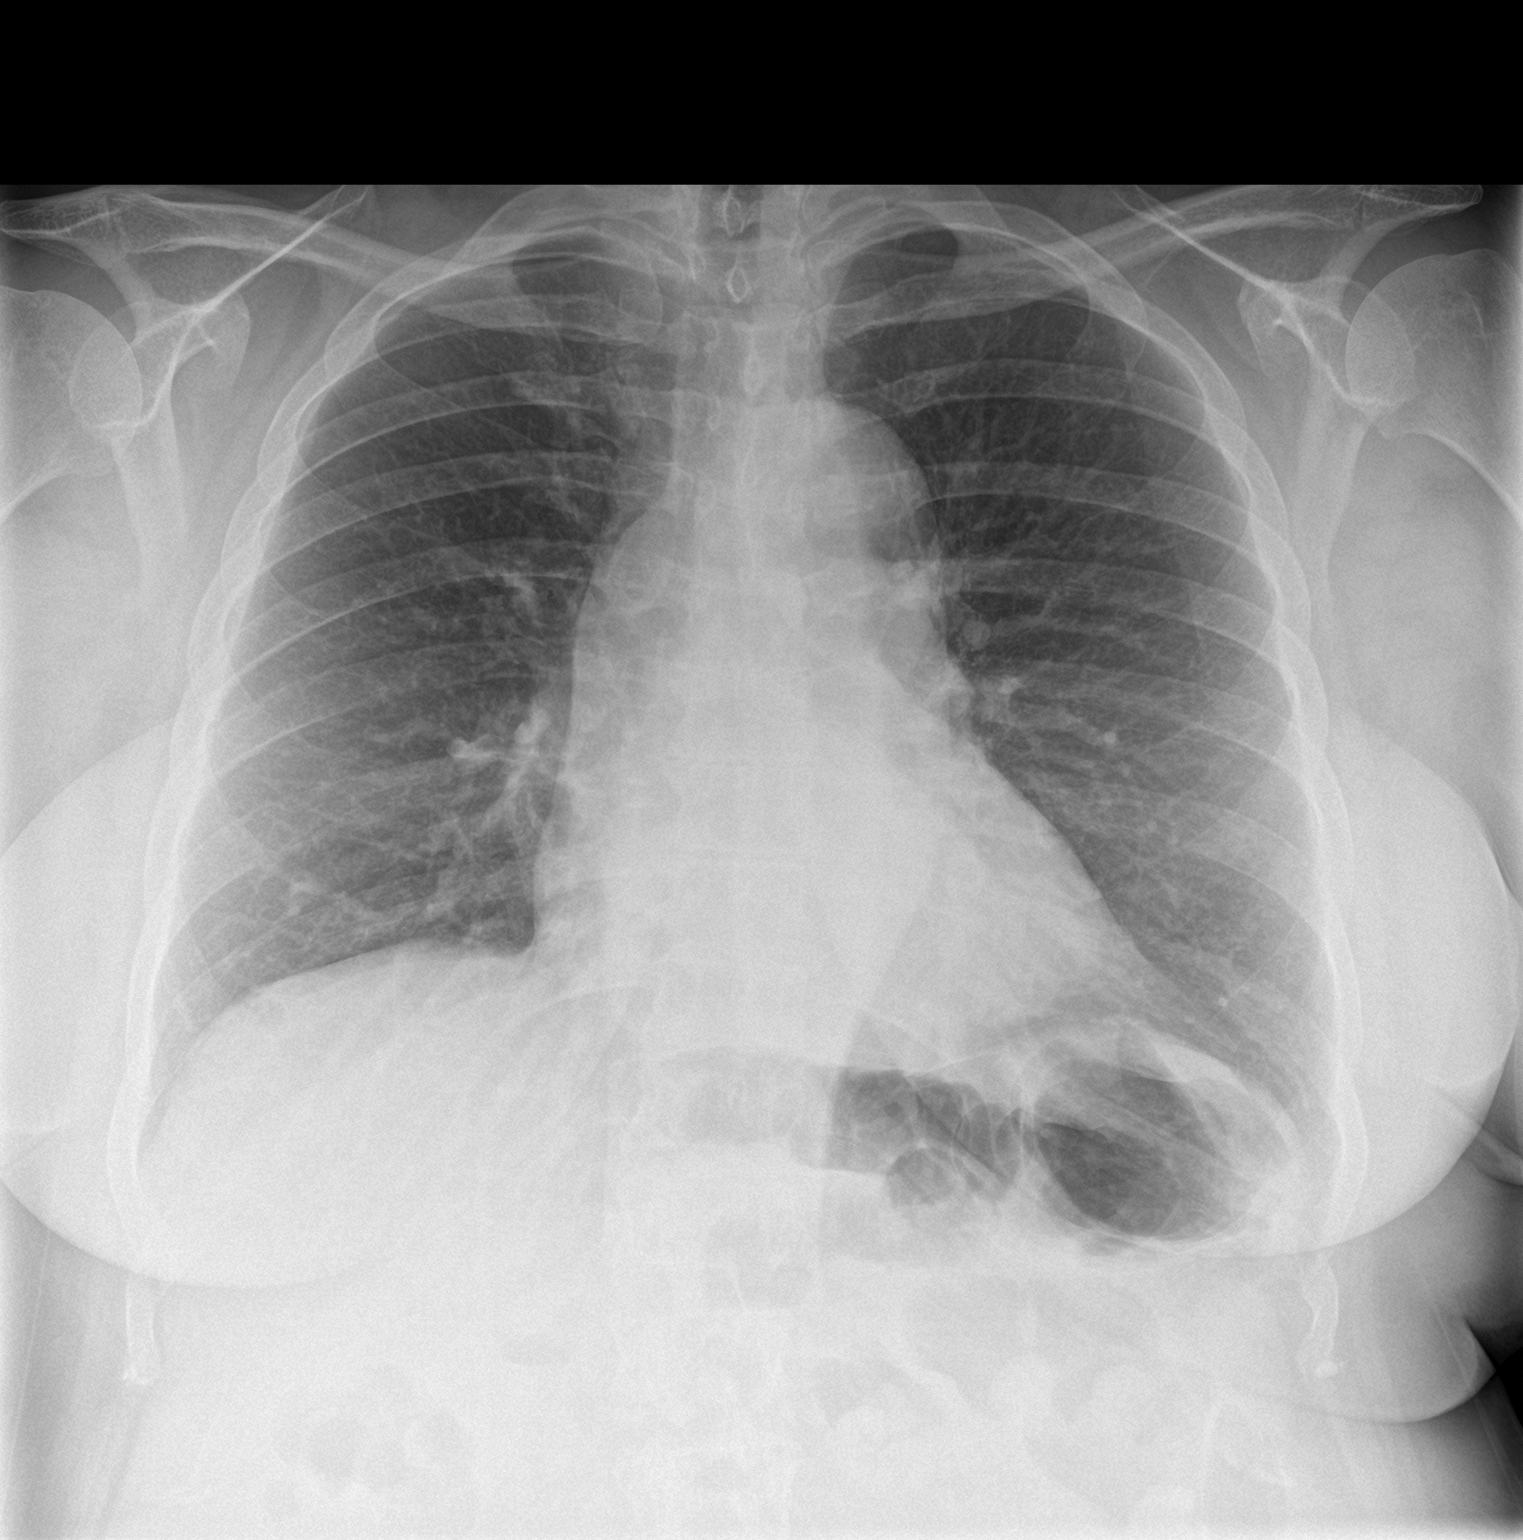

[chest lat]
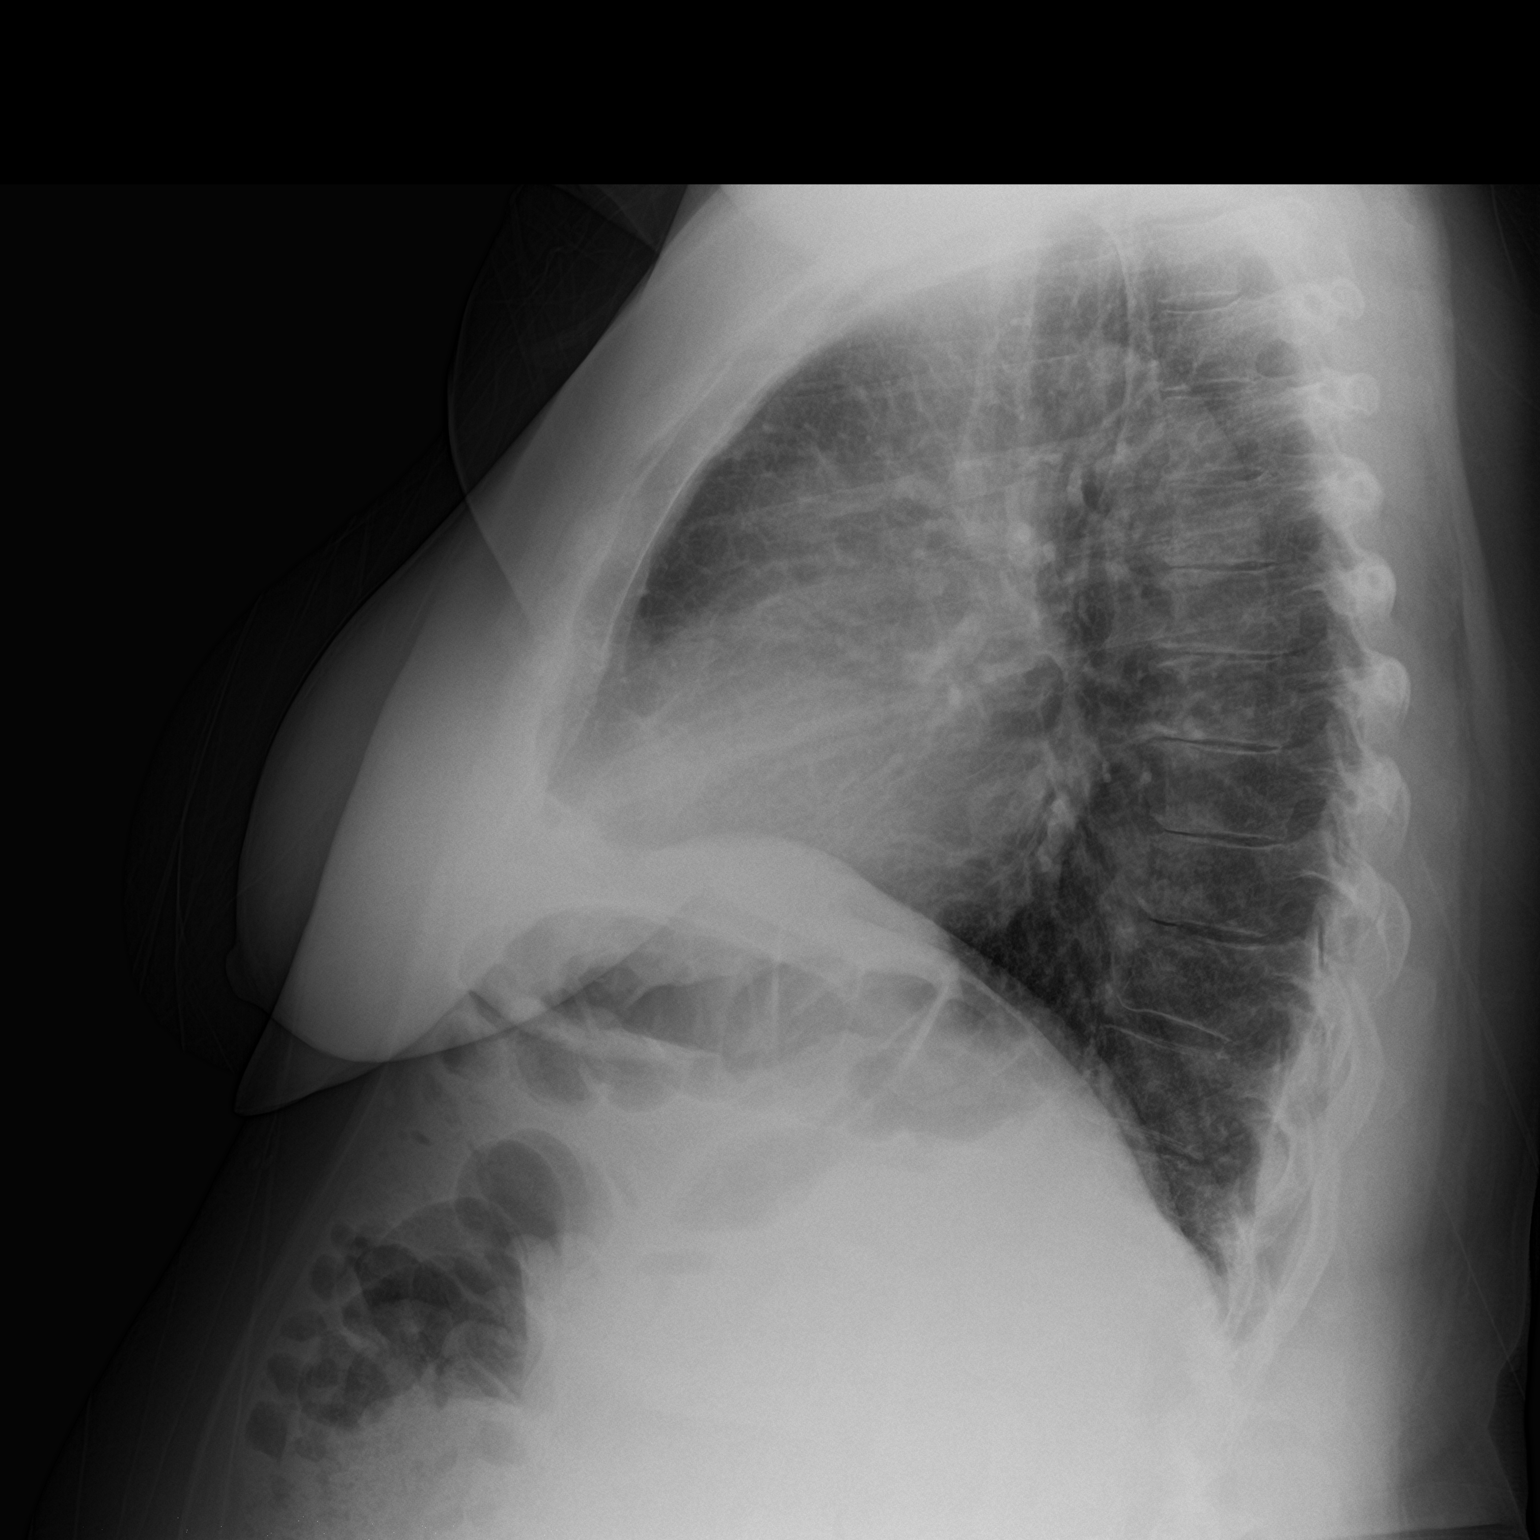

[2 of 2 positions shown; findings below may reference images not displayed]

FINDINGS: The lungs are clear. Heart size is normal. No pneumothorax or
pleural effusion. Multilevel loss of disc space height the mid
thoracic spine is noted.
IMPRESSION: No acute disease.

## 2016-08-01 NOTE — Progress Notes (Signed)
Results for Monica [name], Monica [name] (MRN [id number]) as of 08/01/2016 14:20  Ref. Range 07/24/2016 10:04  COMPREHENSIVE METABOLIC PANEL Unknown Rpt (A)  Sodium Latest Ref Range: 135 - 145 mmol/L 136  Potassium Latest Ref Range: 3.5 - 5.1 mmol/L 3.4 (L)  Chloride Latest Ref Range: 101 - 111 mmol/L 102  CO2 Latest Ref Range: 22 - 32 mmol/L 26  Glucose Latest Ref Range: 65 - 99 mg/dL 87  BUN Latest Ref Range: 6 - 20 mg/dL 15  Creatinine Latest Ref Range: 0.44 - 1.00 mg/dL 0.69  Calcium Latest Ref Range: 8.9 - 10.3 mg/dL 9.0  Anion gap Latest Ref Range: 5 - 15  8  Alkaline Phosphatase Latest Ref Range: 38 - 126 U/L 63  Albumin Latest Ref Range: 3.5 - 5.0 g/dL 3.8  AST Latest Ref Range: 15 - 41 U/L 20  ALT Latest Ref Range: 14 - 54 U/L 14  Total Protein Latest Ref Range: 6.5 - 8.1 g/dL 7.3  Total Bilirubin Latest Ref Range: 0.3 - 1.2 mg/dL 0.6  EGFR (African American) Latest Ref Range: >60 mL/min >60  EGFR (Non-African Amer.) Latest Ref Range: >60 mL/min >60  Interpretation Unknown Comment  WBC Latest Ref Range: 4.0 - 10.5 K/uL 7.6  RBC Latest Ref Range: 3.87 - 5.11 MIL/uL 4.01  Hemoglobin Latest Ref Range: 12.0 - 15.0 g/dL 12.9  HCT Latest Ref Range: 36.0 - 46.0 % 37.9  MCV Latest Ref Range: 78.0 - 100.0 fL 94.5  MCH Latest Ref Range: 26.0 - 34.0 pg 32.2  MCHC Latest Ref Range: 30.0 - 36.0 g/dL 34.0  RDW Latest Ref Range: 11.5 - 15.5 % 13.1  Platelets Latest Ref Range: 150 - 400 K/uL 334  Neutrophils Latest Units: % 58  Lymphocytes Latest Units: % 29  Monocytes Relative Latest Units: % 10  Eosinophil Latest Units: % 3  Basophil Latest Units: % 0  NEUT# Latest Ref Range: 1.7 - 7.7 K/uL 4.4  Lymphocyte # Latest Ref Range: 0.7 - 4.0 K/uL 2.2  Monocyte # Latest Ref Range: 0.1 - 1.0 K/uL 0.7  Eosinophils Absolute Latest Ref Range: 0.0 - 0.7 K/uL 0.3  Basophils Absolute Latest Ref Range: 0.0 - 0.1 K/uL 0.0  Quantiferon Nil Value Latest Units: IU/mL 0.03  QUANTIFERON TB GOLD Latest Ref Range:  Negative  Negative  QUANTIFERON CRITERIA Unknown Comment  QUANTIFERON TB AG VALUE Latest Units: IU/mL 0.03  QUANTIFERON MITOGEN VALUE Latest Units: IU/mL 7.59  QFT TB AG MINUS NIL VALUE Latest Units: IU/mL 0.00  QUANTIFERON INCUBATION Unknown Comment

## 2016-08-28 IMAGING — DX DG THORACIC SPINE 3V
4 series · 4 of 4 positions shown · non-contrast
Comparison: None.

CLINICAL DATA: Abnormal x-ray

EXAM:
THORACIC SPINE - 2 VIEW + SWIMMERS

[t-spine ap]
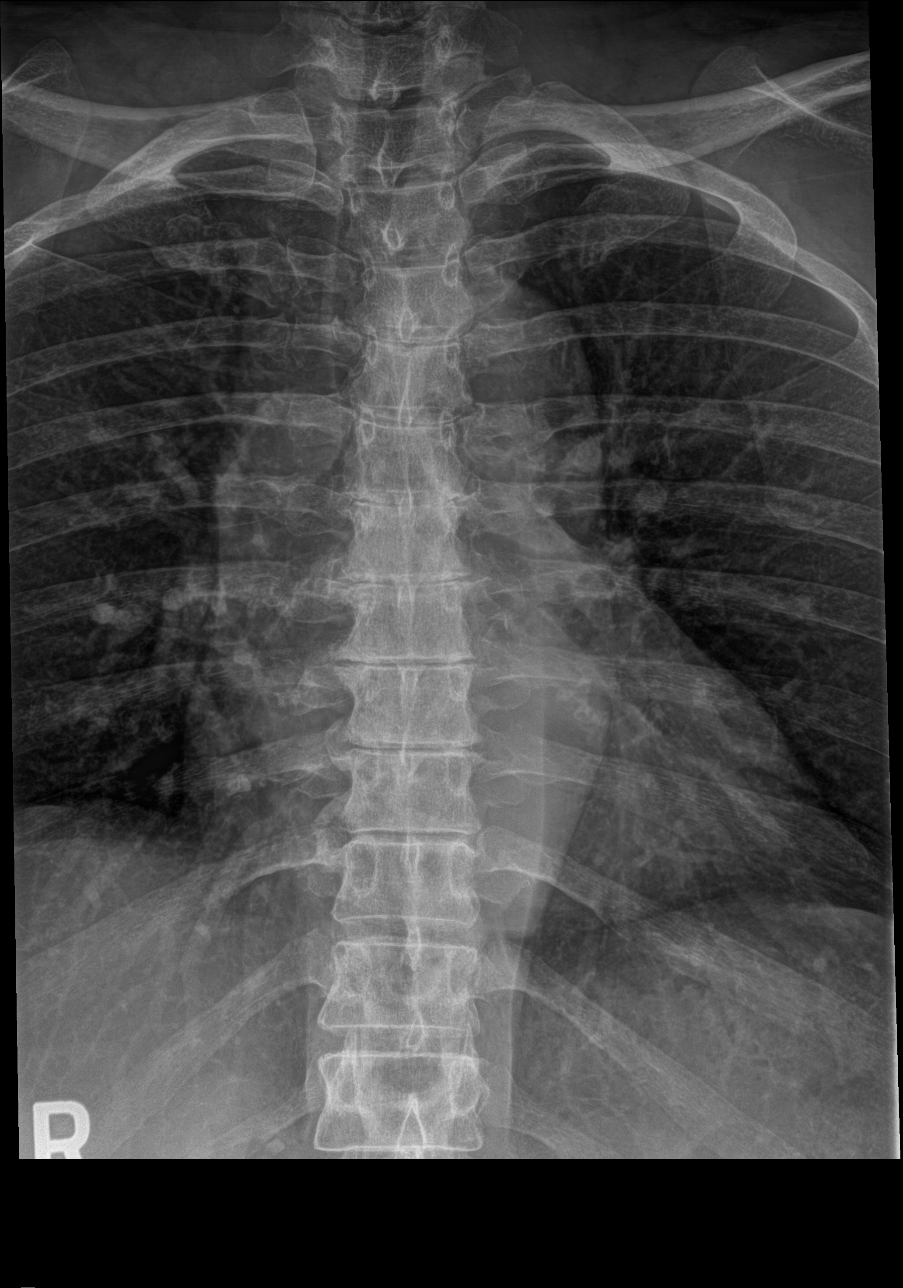

[t-spine lat (1 of 2)]
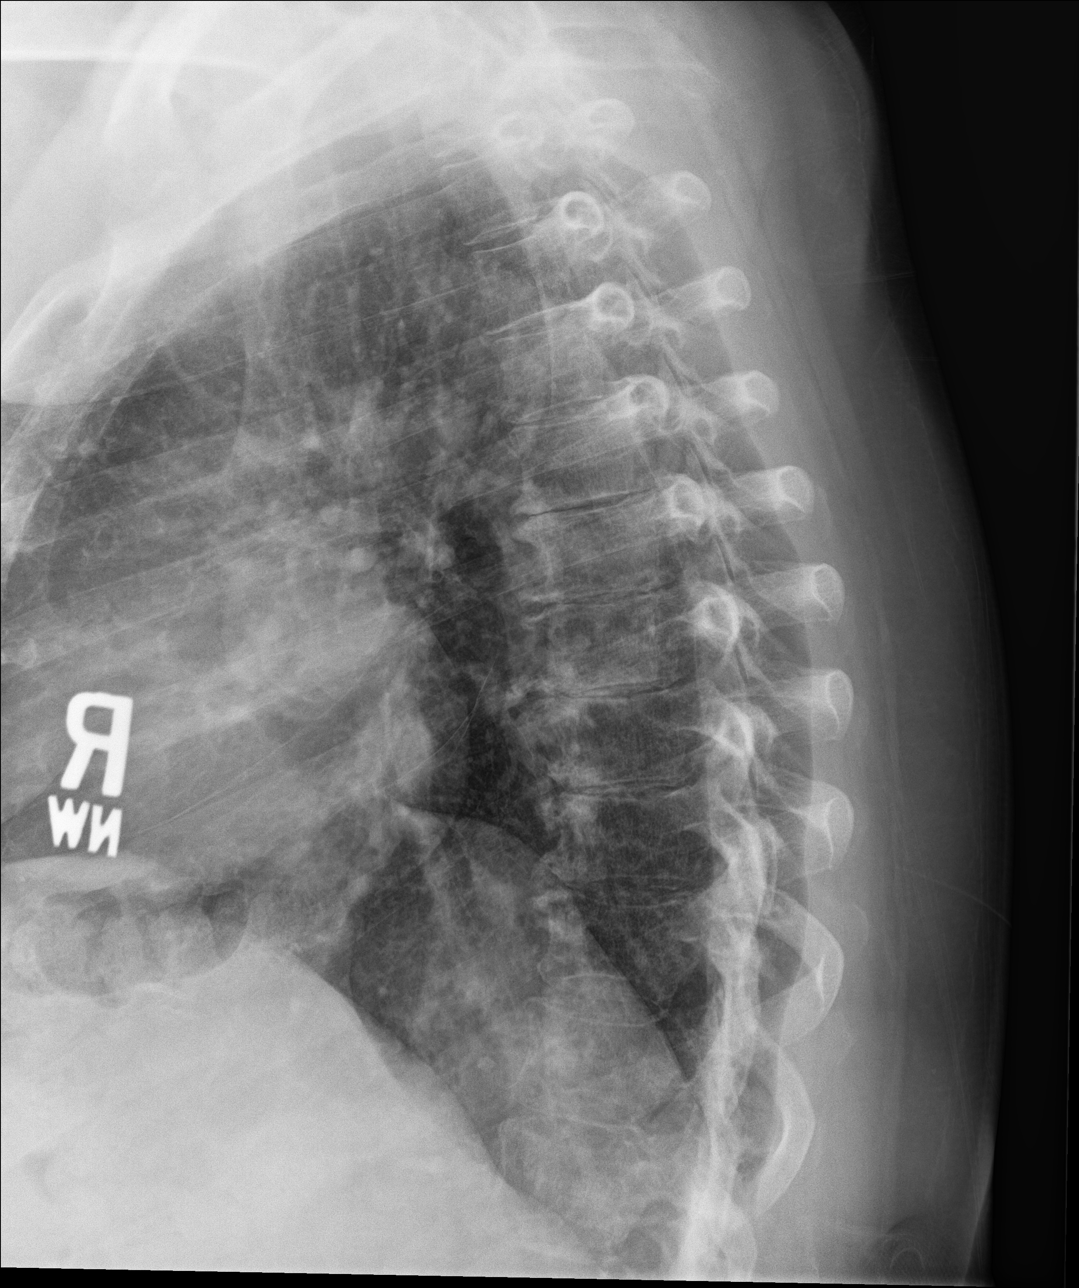

[t-spine lat (2 of 2)]
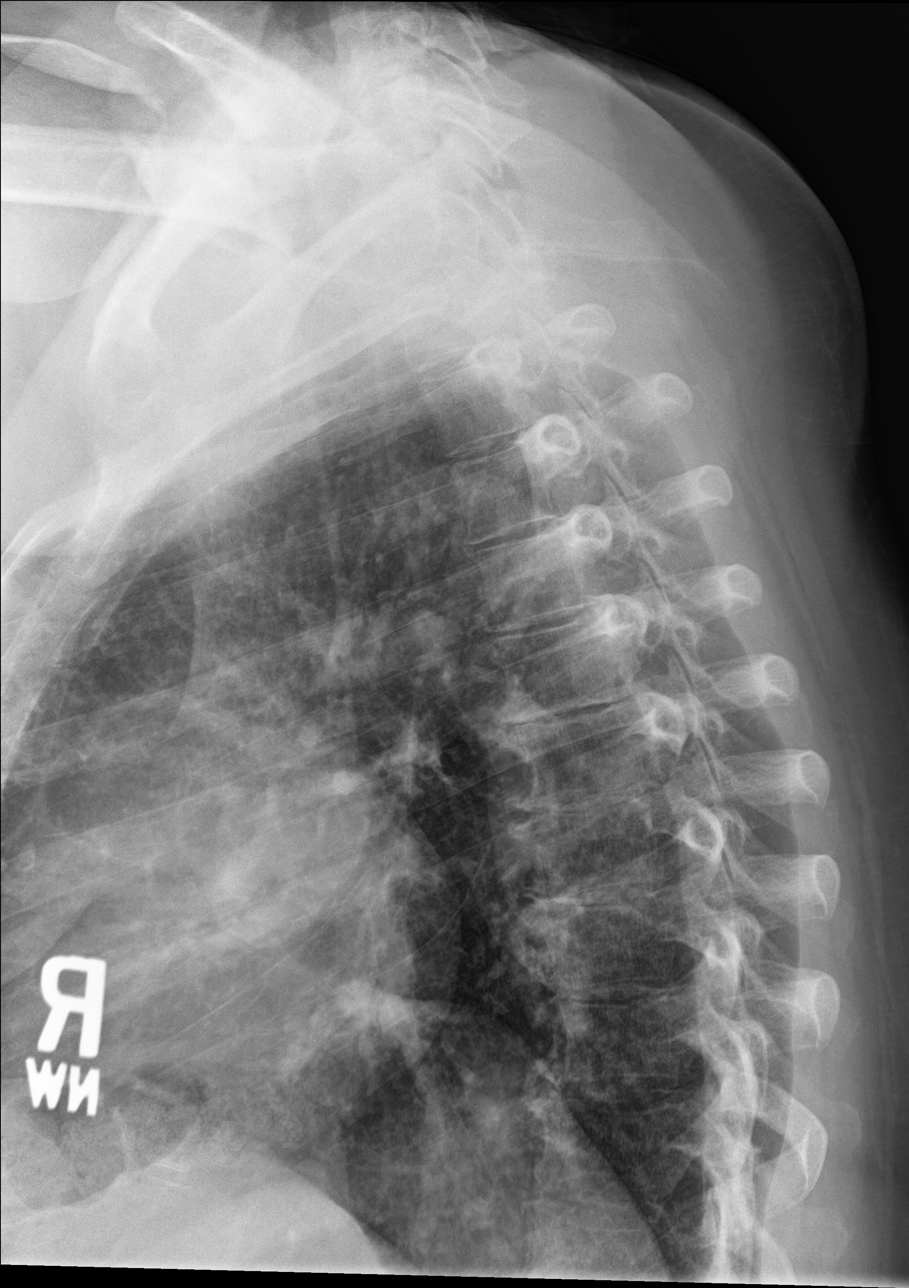

[t-spine swimmers]
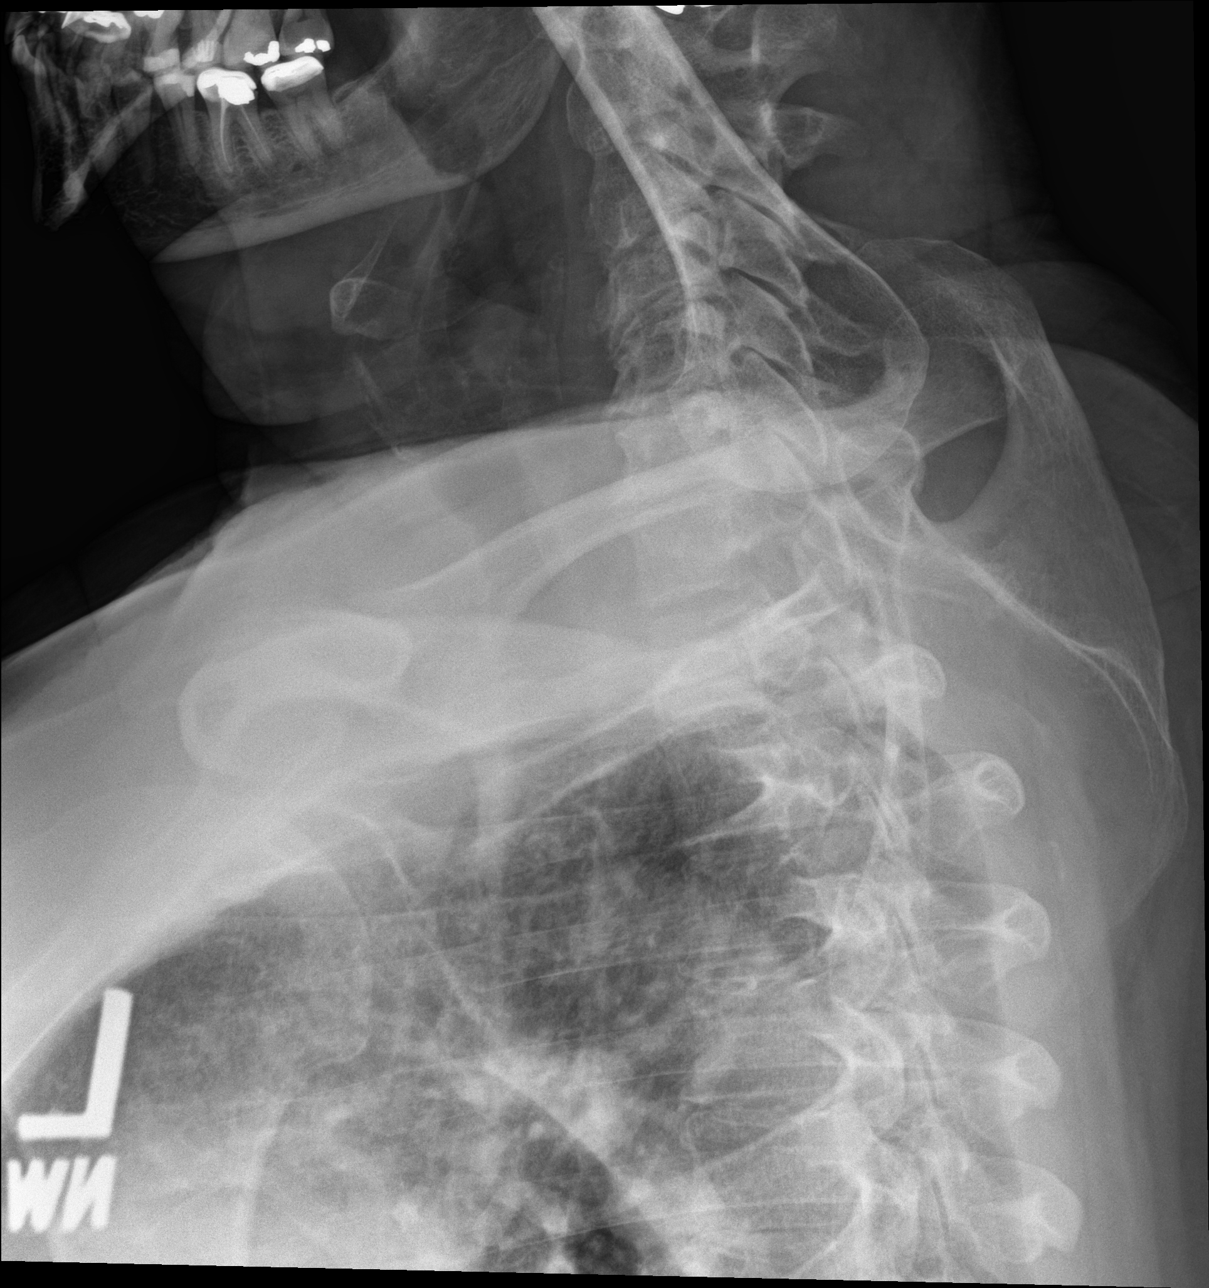

[4 of 4 positions shown; findings below may reference images not displayed]

FINDINGS: Normal thoracic kyphosis.

As noted on prior chest radiographs, there are degenerative changes
with decrease in the intervertebral disc spaces in the mid thoracic
spine. There is associated osteophytosis.

However, there is no evidence of fracture or dislocation. Vertebral
body heights are maintained.

Visualized lungs are clear.
IMPRESSION: Mild degenerative changes of the mid thoracic spine.

No fracture or dislocation is seen.

## 2016-08-29 DIAGNOSIS — Z79899 Other long term (current) drug therapy: Secondary | ICD-10-CM | POA: Diagnosis not present

## 2016-08-29 DIAGNOSIS — H524 Presbyopia: Secondary | ICD-10-CM | POA: Diagnosis not present

## 2016-08-29 DIAGNOSIS — H25041 Posterior subcapsular polar age-related cataract, right eye: Secondary | ICD-10-CM | POA: Diagnosis not present

## 2016-08-29 DIAGNOSIS — H2513 Age-related nuclear cataract, bilateral: Secondary | ICD-10-CM | POA: Diagnosis not present

## 2016-09-01 ENCOUNTER — Other Ambulatory Visit: Payer: Self-pay | Admitting: Radiology

## 2016-09-01 DIAGNOSIS — M0579 Rheumatoid arthritis with rheumatoid factor of multiple sites without organ or systems involvement: Secondary | ICD-10-CM

## 2016-09-01 NOTE — Progress Notes (Signed)
TB gold neg 07/24/16 Orders updated for Simponi Aria  CBC CMP Tylenol and Benadryl

## 2016-09-08 ENCOUNTER — Encounter: Payer: Self-pay | Admitting: Internal Medicine

## 2016-09-08 ENCOUNTER — Other Ambulatory Visit (INDEPENDENT_AMBULATORY_CARE_PROVIDER_SITE_OTHER): Payer: Medicare Other

## 2016-09-08 ENCOUNTER — Ambulatory Visit (INDEPENDENT_AMBULATORY_CARE_PROVIDER_SITE_OTHER)
Admission: RE | Admit: 2016-09-08 | Discharge: 2016-09-08 | Disposition: A | Discharge status: Home / Self Care | Payer: Medicare Other | Source: Ambulatory Visit | Attending: Internal Medicine | Admitting: Internal Medicine

## 2016-09-08 ENCOUNTER — Ambulatory Visit (INDEPENDENT_AMBULATORY_CARE_PROVIDER_SITE_OTHER): Payer: Medicare Other | Admitting: Internal Medicine

## 2016-09-08 VITALS — BP 122/78 | HR 72 | Resp 14 | Ht 63.0 in | Wt 234.0 lb

## 2016-09-08 DIAGNOSIS — Z79899 Other long term (current) drug therapy: Secondary | ICD-10-CM | POA: Diagnosis not present

## 2016-09-08 DIAGNOSIS — J4540 Moderate persistent asthma, uncomplicated: Secondary | ICD-10-CM | POA: Diagnosis not present

## 2016-09-08 DIAGNOSIS — J4531 Mild persistent asthma with (acute) exacerbation: Secondary | ICD-10-CM

## 2016-09-08 DIAGNOSIS — J45909 Unspecified asthma, uncomplicated: Secondary | ICD-10-CM | POA: Diagnosis not present

## 2016-09-08 DIAGNOSIS — M0579 Rheumatoid arthritis with rheumatoid factor of multiple sites without organ or systems involvement: Secondary | ICD-10-CM

## 2016-09-08 DIAGNOSIS — J4530 Mild persistent asthma, uncomplicated: Secondary | ICD-10-CM | POA: Insufficient documentation

## 2016-09-08 LAB — CBC WITH DIFFERENTIAL/PLATELET
BASOS ABS: 0.1 10*3/uL (ref 0.0–0.1)
Basophils Relative: 0.7 % (ref 0.0–3.0)
EOS PCT: 2 % (ref 0.0–5.0)
Eosinophils Absolute: 0.2 10*3/uL (ref 0.0–0.7)
HEMATOCRIT: 39.1 % (ref 36.0–46.0)
Hemoglobin: 13.5 g/dL (ref 12.0–15.0)
LYMPHS PCT: 25.9 % (ref 12.0–46.0)
Lymphs Abs: 2.4 10*3/uL (ref 0.7–4.0)
MCHC: 34.5 g/dL (ref 30.0–36.0)
MCV: 93.5 fl (ref 78.0–100.0)
Monocytes Absolute: 0.7 10*3/uL (ref 0.1–1.0)
Monocytes Relative: 7.8 % (ref 3.0–12.0)
NEUTROS ABS: 5.9 10*3/uL (ref 1.4–7.7)
Neutrophils Relative %: 63.6 % (ref 43.0–77.0)
PLATELETS: 368 10*3/uL (ref 150.0–400.0)
RBC: 4.18 Mil/uL (ref 3.87–5.11)
RDW: 12.7 % (ref 11.5–15.5)
WBC: 9.2 10*3/uL (ref 4.0–10.5)

## 2016-09-08 MED ORDER — FLUTICASONE-UMECLIDIN-VILANT 100-62.5-25 MCG/INH IN AEPB: 1.0000 | INHALATION_SPRAY | Freq: Every day | RESPIRATORY_TRACT | 0 refills | Status: DC

## 2016-09-08 NOTE — Progress Notes (Signed)
Office Visit Note  Patient: Monica [name]             Date of Birth: 29-Mar-1941           MRN: [id number]             PCP: Leighton Ruff, MD Referring: Leighton Ruff, MD Visit Date: 09/13/2016 Occupation: '@GUAROCC'$ @    Subjective:  Pain in hands    History of Present Illness: Monica [name] is a 76 y.o. female with history of rheumatoid arthritis and osteoarthritis overlap. She states she's been having increased pain and discomfort in her bilateral hands recently. She states the pain is in her hands, her wrists joints and sometimes goes to her elbows. She states her right first second and third fingers is still numb. None of the other joints are sore. She describes even nocturnal pain.   Activities of Daily Living:  Patient reports morning stiffness for 2 hours.   Patient Reports nocturnal pain.  Difficulty dressing/grooming: Reports Difficulty climbing stairs: Reports Difficulty getting out of chair: Reports Difficulty using hands for taps, buttons, cutlery, and/or writing: Reports   Review of Systems  Constitutional: Positive for fatigue. Negative for night sweats, weight gain, weight loss and weakness.  HENT: Positive for mouth dryness. Negative for mouth sores, trouble swallowing, trouble swallowing and nose dryness.   Eyes: Positive for dryness. Negative for pain, redness, itching and visual disturbance.  Respiratory: Positive for cough and shortness of breath. Negative for difficulty breathing.   Cardiovascular: Positive for hypertension. Negative for chest pain, palpitations, irregular heartbeat and swelling in legs/feet.  Gastrointestinal: Negative for blood in stool, constipation and diarrhea.  Endocrine: Negative for increased urination.  Genitourinary: Negative for painful urination and vaginal dryness.  Musculoskeletal: Positive for arthralgias, joint pain and morning stiffness. Negative for joint swelling, myalgias, muscle weakness, muscle tenderness and  myalgias.  Skin: Positive for rash. Negative for color change, hair loss, nodules/bumps, redness, skin tightness, ulcers and sensitivity to sunlight.  Allergic/Immunologic: Negative for susceptible to infections.  Neurological: Negative for dizziness, memory loss and night sweats.  Hematological: Negative for swollen glands.  Psychiatric/Behavioral: Positive for depressed mood and sleep disturbance. The patient is not nervous/anxious.     PMFS History:  Patient Active Problem List   Diagnosis Date Noted  . History of asthma 09/08/2016  . High risk medication use 05/15/2016  . Primary osteoarthritis of both hands 05/15/2016  . Primary osteoarthritis of both feet 05/15/2016  . Primary osteoarthritis of both knees 05/15/2016  . Vitamin D deficiency 05/15/2016  . History of hypertension 05/15/2016  . History of hyperlipidemia 05/15/2016  . Rheumatoid arthritis with rheumatoid factor of multiple sites without organ or systems involvement ([place]) 01/10/2016  . Seasonal allergic rhinitis 10/03/2011  . HYPERLIPIDEMIA 12/31/2008  . HYPERTENSION 12/31/2008  . Mild persistent asthmatic bronchitis with exacerbation 12/31/2008    Past Medical History:  Diagnosis Date  . Arthritis   . 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 Hyperlipidemia   . Hypertension     Family History  Problem Relation Age of Onset  . Lung cancer Father    Past Surgical History:  Procedure Laterality Date  . CARPAL TUNNEL RELEASE    . FOOT SURGERY     Social History   Social History Narrative  . No narrative on file     Objective: Vital Signs: BP 122/78   Pulse 74   Resp 14   Wt 236 lb (107  kg)   BMI 41.81 kg/m    Physical Exam  Constitutional: She is oriented to person, place, and time. She appears well-developed and well-nourished.  HENT:  Head: Normocephalic and atraumatic.  Eyes: Conjunctivae and EOM are normal.  Neck: Normal range of motion.  Cardiovascular: Normal rate,  regular rhythm, normal heart sounds and intact distal pulses.   Pulmonary/Chest: Effort normal and breath sounds normal.  Abdominal: Soft.  Lymphadenopathy:    She has no cervical adenopathy.  Neurological: She is alert and oriented to person, place, and time.  Skin: Skin is warm and dry. Capillary refill takes less than 2 seconds. Rash noted.  Fine pustular lesions noted on the palmar and plantar surface consistent with pustular psoriasis  Psychiatric: She has a normal mood and affect. Her behavior is normal.  Nursing note and vitals reviewed.    Musculoskeletal Exam: C-spine and thoracic spine good range of motion. She has discomfort with range of motion of her lumbar spine. Shoulder joints elbow joints are good range of motion. She has synovitis and swelling over her left wrist. She also has swelling over her right second MCP joint. Although joints afford range of motion with no synovitis.  CDAI Exam: CDAI Homunculus Exam:   Tenderness:  LUE: wrist Right hand: 2nd MCP RLE: tibiofemoral LLE: tibiofemoral  Swelling:  LUE: wrist Right hand: 2nd MCP  Joint Counts:  CDAI Tender Joint count: 4 CDAI Swollen Joint count: 2  Global Assessments:  Patient Global Assessment: 8 Provider Global Assessment: 6  CDAI Calculated Score: 20    Investigation: Findings:  07/24/16 negative TB gold 09/08/2016 CBC normal, CMP normal  Appointment on 09/08/2016  Component Date Value Ref Range Status  . WBC 09/08/2016 9.2  4.0 - 10.5 K/uL Final  . RBC 09/08/2016 4.18  3.87 - 5.11 Mil/uL Final  . Hemoglobin 09/08/2016 13.5  12.0 - 15.0 g/dL Final  . HCT 09/08/2016 39.1  36.0 - 46.0 % Final  . MCV 09/08/2016 93.5  78.0 - 100.0 fl Final  . MCHC 09/08/2016 34.5  30.0 - 36.0 g/dL Final  . RDW 09/08/2016 12.7  11.5 - 15.5 % Final  . Platelets 09/08/2016 368.0  150.0 - 400.0 K/uL Final  . Neutrophils Relative % 09/08/2016 63.6  43.0 - 77.0 % Final  . Lymphocytes Relative 09/08/2016 25.9  12.0 -  46.0 % Final  . Monocytes Relative 09/08/2016 7.8  3.0 - 12.0 % Final  . Eosinophils Relative 09/08/2016 2.0  0.0 - 5.0 % Final  . Basophils Relative 09/08/2016 0.7  0.0 - 3.0 % Final  . Neutro Abs 09/08/2016 5.9  1.4 - 7.7 K/uL Final  . Lymphs Abs 09/08/2016 2.4  0.7 - 4.0 K/uL Final  . Monocytes Absolute 09/08/2016 0.7  0.1 - 1.0 K/uL Final  . Eosinophils Absolute 09/08/2016 0.2  0.0 - 0.7 K/uL Final  . Basophils Absolute 09/08/2016 0.1  0.0 - 0.1 K/uL Final  . Allergen, D pternoyssinus,d7 09/08/2016 <0.10  kU/L Final  . D. farinae 09/08/2016 <0.10  kU/L Final  . Cat Dander 09/08/2016 <0.10  kU/L Final  . Dog Dander 09/08/2016 <0.10  kU/L Final  . [place] Grass 09/08/2016 <0.10  kU/L Final  . Jalene Mullet 09/08/2016 <0.10  kU/L Final  . [place] Amy 09/08/2016 <0.10  kU/L Final  . Cockroach 09/08/2016 <0.10  kU/L Final  . Allergen, P. notatum, m1 09/08/2016 <0.10  kU/L Final  . Allergen, C. Herbarum, M2 09/08/2016 <0.10  kU/L Final  . Aspergillus fumigatus, m3  09/08/2016 <0.10  kU/L Final  . Allergen, A. alternata, m6 09/08/2016 <0.10  kU/L Final  . Box Elder IgE 09/08/2016 <0.10  kU/L Final  . Allergen, Comm Silver Wendee Copp, t9 09/08/2016 <0.10  kU/L Final  . Allergen, Cedar tree, t12 09/08/2016 <0.10  kU/L Final  . Allergen, Oak,t7 09/08/2016 <0.10  kU/L Final  . Elm IgE 09/08/2016 <0.10  kU/L Final  . Allergen, Cottonwood, t14 09/08/2016 <0.10  kU/L Final  . Pecan/Hickory Tree IgE 09/08/2016 <0.10  kU/L Final  . Allergen, Mulberry, t76 09/08/2016 <0.10  kU/L Final  . Common Ragweed 09/08/2016 <0.10  kU/L Final  . Rough Pigweed  IgE 09/08/2016 <0.10  kU/L Final  . Sheep Sorrel IgE 09/08/2016 <0.10  kU/L Final  . IgE (Immunoglobulin E), Serum 09/08/2016 9  <115 kU/L Final  . Allergen, Mouse Urine Protein, e78 09/08/2016 <0.10  kU/L Final   Comment:   Footnotes:  (1)        ----------------------------------------------------      Class   Specific IgE (kU/L)   Level       ----------------------------------------------------      0       <0.10                 Absent or undetectable      0/1     0.10  -   0.34        Equivocal/Borderline      1       0.35  -   0.69        Low      2       0.70  -   3.49        Moderate      3       3.50  -  17.49        High      4       17.50 -  49.99        Very High      5       50.00 - 100.00        Very High      6       >100.00               Very High   Hospital Outpatient Visit on 07/24/2016  Component Date Value Ref Range Status  . QUANTIFERON INCUBATION 07/24/2016 Comment   Final   Comment: (NOTE) Specimen incubated at [place], [place], [place]. Performed At: [facility] [place], [place] [id number] Lindon Romp MD QH:[id number]   . WBC 07/24/2016 7.6  4.0 - 10.5 K/uL Final  . RBC 07/24/2016 4.01  3.87 - 5.11 MIL/uL Final  . Hemoglobin 07/24/2016 12.9  12.0 - 15.0 g/dL Final  . HCT 07/24/2016 37.9  36.0 - 46.0 % Final  . MCV 07/24/2016 94.5  78.0 - 100.0 fL Final  . MCH 07/24/2016 32.2  26.0 - 34.0 pg Final  . MCHC 07/24/2016 34.0  30.0 - 36.0 g/dL Final  . RDW 07/24/2016 13.1  11.5 - 15.5 % Final  . Platelets 07/24/2016 334  150 - 400 K/uL Final  . Neutrophils Relative % 07/24/2016 58  % Final  . Neutro Abs 07/24/2016 4.4  1.7 - 7.7 K/uL Final  . Lymphocytes Relative 07/24/2016 29  % Final  . Lymphs Abs 07/24/2016 2.2  0.7 - 4.0 K/uL Final  . Monocytes Relative 07/24/2016 10  % Final  .  Monocytes Absolute 07/24/2016 0.7  0.1 - 1.0 K/uL Final  . Eosinophils Relative 07/24/2016 3  % Final  . Eosinophils Absolute 07/24/2016 0.3  0.0 - 0.7 K/uL Final  . Basophils Relative 07/24/2016 0  % Final  . Basophils Absolute 07/24/2016 0.0  0.0 - 0.1 K/uL Final  . Sodium 07/24/2016 136  135 - 145 mmol/L Final  . Potassium 07/24/2016 3.4* 3.5 - 5.1 mmol/L Final  . Chloride 07/24/2016 102  101 - 111 mmol/L Final  . CO2 07/24/2016 26  22 - 32 mmol/L Final  . Glucose, Bld 07/24/2016 87  65 - 99  mg/dL Final  . BUN 07/24/2016 15  6 - 20 mg/dL Final  . Creatinine, Ser 07/24/2016 0.69  0.44 - 1.00 mg/dL Final  . Calcium 07/24/2016 9.0  8.9 - 10.3 mg/dL Final  . Total Protein 07/24/2016 7.3  6.5 - 8.1 g/dL Final  . Albumin 07/24/2016 3.8  3.5 - 5.0 g/dL Final  . AST 07/24/2016 20  15 - 41 U/L Final  . ALT 07/24/2016 14  14 - 54 U/L Final  . Alkaline Phosphatase 07/24/2016 63  38 - 126 U/L Final  . Total Bilirubin 07/24/2016 0.6  0.3 - 1.2 mg/dL Final  . GFR calc non Af Amer 07/24/2016 >60  >60 mL/min Final  . GFR calc Af Amer 07/24/2016 >60  >60 mL/min Final   Comment: (NOTE) The eGFR has been calculated using the CKD EPI equation. This calculation has not been validated in all clinical situations. eGFR's persistently <60 mL/min signify possible Chronic Kidney Disease.   . Anion gap 07/24/2016 8  5 - 15 Final  . QUANTIFERON TB GOLD 07/24/2016 Negative  Negative Final  . QUANTIFERON CRITERIA 07/24/2016 Comment   Final   Comment: (NOTE) To be considered positive a specimen should have a TB Ag minus Nil value greater than or equal to 0.35 IU/mL and in addition the TB Ag minus Nil value must be greater than or equal to 25% of the Nil value. There may be insufficient information in these values to differentiate between some negative and some indeterminate test values.   . QUANTIFERON TB AG VALUE 07/24/2016 0.03  IU/mL Final  . Quantiferon Nil Value 07/24/2016 0.03  IU/mL Final  . QUANTIFERON MITOGEN VALUE 07/24/2016 7.59  IU/mL Final  . QFT TB AG MINUS NIL VALUE 07/24/2016 0.00  IU/mL Final  . Interpretation: 07/24/2016 Comment   Final   Comment: (NOTE) The QuantiFERON TB Gold (in Tube) assay is intended for use as an aid in the diagnosis of TB infection. Negative results suggest that there is no TB infection. In patients with high suspicion of exposure, a negative test should be repeated. A positive test indicates infection with Mycobacterium tuberculosis. Among individuals  without tuberculosis infection, a positive test may be due to exposure to [place], M. szulgai or M. marinum. On the Internet, go to https://figueroa-lambert.info/ for further details. Performed At: [facility] [place], [place] [id number] Lindon Romp MD AX:[id number]     Imaging: Dg Chest 2 View  Result Date: 09/08/2016 CLINICAL DATA:  Moderate persistent asthma without complication. Hypertension. EXAM: CHEST  2 VIEW COMPARISON:  08/26/2015 FINDINGS: The heart size and mediastinal contours are within normal limits. Stable mild ectasia of thoracic aorta. Both lungs are clear. Thoracic spine degenerative changes again noted . IMPRESSION: Stable exam.  No active cardiopulmonary disease. Electronically Signed   By: Earle Gell M.D.   On: 09/08/2016 11:27  Speciality Comments: No specialty comments available.    Procedures:  No procedures performed Allergies: Arava [leflunomide] and Piroxicam   Assessment / Plan:     Visit Diagnoses: Rheumatoid arthritis with rheumatoid factor of multiple sites without organ or systems involvement (HCC) - Positive RF, positive anti-CCP, elevated ESR. Patient is history of increased pain and swelling in her bilateral hands and wrists joints. She complains of nocturnal pain. She has synovitis on examination as described above. Her arthritis is flaring despite the treatment. Different treatment options and their side effects were discussed at length. I discussed the possible use of Orencia, Actemra or Xeljanz. After reviewing all the medications she decided to proceed with Orencia IV. On chart review she does not appear to have COPD. We will apply for Orencia IV. She prefers the medication due to decreased risk of infections. She's having a lot of discomfort and pain I will give her a prescription for prednisone as a bridging therapy.  High risk medication use - Plaquenil 200 mg a.m., 100 mg p.m., Simponi Aria IV_0  [facility](MTX-fatigue, oral  ulcers, Arava-cough). 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 I've advised her to schedule appointment with a dermatologist.  Primary osteoarthritis of both hands: She has some chronic pain and stiffness  Primary osteoarthritis of both knees: Chronic pain  Primary osteoarthritis of both feet  History of hyperlipidemia  History of hypertension: Her blood pressure is well controlled.  Vitamin D deficiency  History of asthma    Orders: No orders of the defined types were placed in this encounter.  Meds ordered this encounter  Medications  . predniSONE (DELTASONE) 5 MG tablet    Sig: 3 tab q am x7days, 2 tab q am x7d, 1 tab q amx7 days, 1/2 tab qam x7d then  d/c    Dispense:  45.5 tablet    Refill:  0    Face-to-face time spent with patient was 40 minutes. 50% of time was spent in counseling and coordination of care.  Follow-Up Instructions: Return in about 3 months (around 12/14/2016) for Rheumatoid arthritis.   Bo Merino, MD  Note - This record has been created using Editor, commissioning.  Chart creation errors have been sought, but may not always  have been located. Such creation errors do not reflect on  the standard of medical care.

## 2016-09-08 NOTE — Patient Instructions (Signed)
Order- office spirometry    Dx asthma moderate persistent  Order- CXR  Order- lab- cbc w diff, Allergy Profile  Sample x 2 Trelegy inhaler    Inhale 1 puff, once daily then rinse mouth     Try this instead of Anoro  Please call as needed

## 2016-09-08 NOTE — Progress Notes (Signed)
HPI female never smoker followed for asthma, allergic rhinitis, complicated by rheumatoid arthritis PFT 02/02/09-mild obstruction small airways, minimal response bronchodilator, DLCO mildly reduced. FVC 3.36/116%, FEV1 2.48/119%, ratio 0.74, FEF 25-75% 1.80/75%, TLC 108%, DLCO 70% Office Spirometry 09/08/16-WNL-FVC 2.63/98%, FEV1 1.91/95%, ratio 0.73, FEF 25-75% 1.32/82% --------------------------------------------------------------------------------------  11/01/2015-76 year old female never smoker followed for asthma, allergic rhinitis, complicated by rheumatoid arthritis FOLLOWS FOR: pt states breathing is doing well. pt c/o cont prod cough w/clear mucus. cough has improved since stopping arava. Increased cough during the spring was reportedly associated with a rheumatoid arthritis medication( Arava) and resolved when that was discontinued. Still has some baseline cough, dry, not relieved by cough syrup and cough drops. Sometimes aware of minor asthma with occasional use of rescue inhaler.  Takes Lasix 20 mg occasionally for edema in her feet. CXR 08/26/2015 FINDINGS: The lungs are clear. The heart size and pulmonary vascularity are normal. No adenopathy. There is mild degenerative change in the thoracic spine. IMPRESSION: No edema or consolidation. Electronically Signed  By: Bretta Bang III M.D.  On: 08/26/2015 15:46  09/08/16- 76 year old female never smoker followed for asthma, chronic cough, allergic rhinitis, complicated by rheumatoid arthritis FOLLOW FOR patient continues to have sob and wheezing with little exertion  Wheezed all winter with some benefit from rescue inhaler used several times a day. Denies symptoms suggesting rhinitis or sinusitis now or throughout the winter. Doing better now than she was. Denies postnasal drip or reflux. Not sure that Anoro worked any better than [employer]. Office Spirometry 09/08/16-WNL-FVC 2.63/98%, FEV1 1.91/95%, ratio 0.73, FEF 25-75%  1.32/82%  ROS-see HPI  Constitutional:   No-   weight loss, night sweats, fevers, chills, fatigue, lassitude. HEENT:   No-  headaches, difficulty swallowing, tooth/dental problems, sore throat,       No-  sneezing, itching, ear ache, nasal congestion, post nasal drip,  CV:  No-   chest pain, orthopnea, PND, +swelling in lower extremities, anasarca, dizziness, palpitations Resp:   + shortness of breath with exertion or at rest.              No-   productive cough,  + non-productive cough,  No- coughing up of blood.              No-   change in color of mucus.  +rare wheezing.   Skin: No-   rash or lesions. GI:  No-   heartburn, indigestion, abdominal pain, nausea, vomiting,  GU:  MS: + joint pain or swelling.   Neuro-     nothing unusual Psych:  No- change in mood or affect. No depression or anxiety.  No memory loss.  OBJ       stable baseline exam General- Alert, Oriented, Affect-appropriate, Distress- none acute. + Obese Skin- rash-none, lesions- none, excoriation- none Lymphadenopathy- none Head- atraumatic            Eyes- Gross vision intact, PERRLA, conjunctivae clear secretions            Ears- Hearing, canals-normal            Nose- Clear, no-Septal dev, mucus, polyps, erosion, perforation             Throat- Mallampati III , mucosa clear , drainage- none, tonsils- atrophic. +Easy gag. Neck- flexible , trachea midline, no stridor , thyroid nl, carotid no bruit Chest - symmetrical excursion , unlabored           Heart/CV- RRR , no murmur , no gallop  , no rub,  nl s1 s2                           - JVD- none , edema- none, stasis changes- none, varices- none           Lung- clear, wheeze- none, cough- none , dullness-none, rub- none           Chest wall-  Abd-  Br/ Gen/ Rectal- Not done, not indicated Extrem- cyanosis- none, clubbing, none, atrophy- none, strength- nl Neuro- grossly intact to observation

## 2016-09-09 NOTE — Assessment & Plan Note (Signed)
She describes pain as pretty constant, working with rheumatologist.

## 2016-09-09 NOTE — Assessment & Plan Note (Signed)
She is describing moderate persistent symptoms but I'm not sure what she feels is asthma. She doesn't get a lot of benefit from bronchodilators, office spirometry is normal and I don't hear wheezing today. Plan-CXR, office from a tree done, CBC with differential for eosinophil count, allergy profile with total IgE.

## 2016-09-11 LAB — RESPIRATORY ALLERGY PROFILE REGION II ~~LOC~~
Allergen, A. alternata, m6: <0.1 kU/L
Allergen, Cedar tree, t12: <0.1 kU/L
Allergen, Mouse Urine Protein, e78: <0.1 kU/L
Allergen, Oak,t7: <0.1 kU/L
Box Elder IgE: <0.1 kU/L
Cat Dander: <0.1 kU/L
Dog Dander: <0.1 kU/L
Elm IgE: <0.1 kU/L
IGE (IMMUNOGLOBULIN E), SERUM: 9 kU/L (ref <115)
Pecan/Hickory Tree IgE: <0.1 kU/L
Rough Pigweed  IgE: <0.1 kU/L
Sheep Sorrel IgE: <0.1 kU/L

## 2016-09-11 NOTE — Progress Notes (Signed)
Spoke with patient and informed her of results. She verbalized understanding. Nothing further is needed.

## 2016-09-13 ENCOUNTER — Ambulatory Visit (INDEPENDENT_AMBULATORY_CARE_PROVIDER_SITE_OTHER): Payer: Medicare Other | Admitting: Rheumatology

## 2016-09-13 ENCOUNTER — Encounter: Payer: Self-pay | Admitting: Rheumatology

## 2016-09-13 ENCOUNTER — Telehealth: Payer: Self-pay

## 2016-09-13 VITALS — BP 122/78 | HR 74 | Resp 14 | Wt 236.0 lb

## 2016-09-13 DIAGNOSIS — M19041 Primary osteoarthritis, right hand: Secondary | ICD-10-CM

## 2016-09-13 DIAGNOSIS — M19071 Primary osteoarthritis, right ankle and foot: Secondary | ICD-10-CM | POA: Diagnosis not present

## 2016-09-13 DIAGNOSIS — E559 Vitamin D deficiency, unspecified: Secondary | ICD-10-CM | POA: Diagnosis not present

## 2016-09-13 DIAGNOSIS — M19042 Primary osteoarthritis, left hand: Secondary | ICD-10-CM | POA: Diagnosis not present

## 2016-09-13 DIAGNOSIS — Z8709 Personal history of other diseases of the respiratory system: Secondary | ICD-10-CM

## 2016-09-13 DIAGNOSIS — R21 Rash and other nonspecific skin eruption: Secondary | ICD-10-CM | POA: Diagnosis not present

## 2016-09-13 DIAGNOSIS — Z8679 Personal history of other diseases of the circulatory system: Secondary | ICD-10-CM | POA: Diagnosis not present

## 2016-09-13 DIAGNOSIS — M19072 Primary osteoarthritis, left ankle and foot: Secondary | ICD-10-CM

## 2016-09-13 DIAGNOSIS — M170 Bilateral primary osteoarthritis of knee: Secondary | ICD-10-CM

## 2016-09-13 DIAGNOSIS — Z79899 Other long term (current) drug therapy: Secondary | ICD-10-CM | POA: Diagnosis not present

## 2016-09-13 DIAGNOSIS — Z8639 Personal history of other endocrine, nutritional and metabolic disease: Secondary | ICD-10-CM | POA: Diagnosis not present

## 2016-09-13 DIAGNOSIS — M0579 Rheumatoid arthritis with rheumatoid factor of multiple sites without organ or systems involvement: Secondary | ICD-10-CM

## 2016-09-13 MED ORDER — PREDNISONE 5 MG PO TABS: ORAL_TABLET | ORAL | 0 refills | Status: DC

## 2016-09-13 NOTE — Progress Notes (Signed)
Pharmacy Note  Subjective: Patient presents today to the [facility] to see Dr. Corliss Skains.  Monica [name] is a pleasant 76 yo F who presents for follow up of her rheumatoid arthritis.  She has Simponi Aria infusions at [facility] every 8 weeks.  She is also taking hydroxychloroquine 200 mg in the morning and 100 mg in the evening.  Patient reports she is tolerating her mediations well; however, patient reports she is having increased stiffness and discomfort in her hands.  Decision was made to change patient from [employer].  Had discussion of treatment options including Orencia, Actemra infusions, or Xeljanz.  Patient preferred option with less infection risk and would like to try Orencia.  Patient seen by the pharmacist for counseling on IV Orencia.    Objective: CBC    Component Value Date/Time   WBC 9.2 09/08/2016 1053   RBC 4.18 09/08/2016 1053   HGB 13.5 09/08/2016 1053   HCT 39.1 09/08/2016 1053   PLT 368.0 09/08/2016 1053   MCV 93.5 09/08/2016 1053   MCH 32.2 07/24/2016 1004   MCHC 34.5 09/08/2016 1053   RDW 12.7 09/08/2016 1053   LYMPHSABS 2.4 09/08/2016 1053   MONOABS 0.7 09/08/2016 1053   EOSABS 0.2 09/08/2016 1053   BASOSABS 0.1 09/08/2016 1053   CMP     Component Value Date/Time   NA 136 07/24/2016 1004   K 3.4 (L) 07/24/2016 1004   CL 102 07/24/2016 1004   CO2 26 07/24/2016 1004   GLUCOSE 87 07/24/2016 1004   BUN 15 07/24/2016 1004   CREATININE 0.69 07/24/2016 1004   CALCIUM 9.0 07/24/2016 1004   PROT 7.3 07/24/2016 1004   ALBUMIN 3.8 07/24/2016 1004   AST 20 07/24/2016 1004   ALT 14 07/24/2016 1004   ALKPHOS 63 07/24/2016 1004   BILITOT 0.6 07/24/2016 1004   GFRNONAA >60 07/24/2016 1004   GFRAA >60 07/24/2016 1004   TB Gold: negative (07/14/16) Hepatitis panel: negative (07/24/14) HIV: negative (07/13/15) SPEP: 10/06/14 Immunoglobulins: 10/06/14  Assessment/Plan:  Counseled patient that Orencia is a selective T-cell costimulation blocker  indicated for rheumatoid arthritis.  Counseled patient on purpose, proper use, and adverse effects of Orencia. The most common adverse effects are increased risk of infections, headache, and infusion reactions.  There is the possibility of an increased risk of malignancy but it is not well understood if this increased risk is due to the medication or the disease state.  Also discussed that Orencia can worsen symptoms of COPD.  Patient confirms she does not have COPD.  She is diagnosed with asthma and is followed closely by Dr. Maple Hudson.  Advised patient to inform us if her cough or dyspnea worsens on Orencia.  Patient voiced understnading.  Provided patient with medication education material and answered all questions.  Patient consented to [facility].  Will upload consent into patient's chart.  Will complete benefits investigation through patient's insurance.  Will update patient once benefits investigation is complete.  Patient is aware Orencia cannot be started until 8 weeks after her most recent Simponi Aria infusion.    Most recent hydroxychloroquine eye exam on file is from 08/21/15 which was normal.  Patient confirms she had an eye exam last week with Dr. Burgess Estelle at [facility] and she reports it was normal.  I called their office and spoke to [place].  I asked for them to send [place] the records from her recent eye exam.    Patient was also prescribed prednisone taper during today's visit.  Patient was counseled on the purpose, proper use, and adverse effects of prednisone.  Advised patient to take prednisone in the morning with food and to monitor blood sugar and blood pressure closely.    Lilla Shook, Pharm.D., BCPS Clinical Pharmacist Pager: [phone] Phone: [phone] 09/13/2016 10:09 AM

## 2016-09-13 NOTE — Patient Instructions (Signed)
Abatacept solution for injection (subcutaneous or intravenous use)  What is this medicine?  ABATACEPT (a ba TA sept) is used to treat moderate to severe active rheumatoid arthritis or psoriatic arthritis in adults. This medicine is also used to treat juvenile idiopathic arthritis.  This medicine may be used for other purposes; ask your health care provider or pharmacist if you have questions.  COMMON BRAND NAME(S): Orencia  What should I tell my health care provider before I take this medicine?  They need to know if you have any of these conditions:  -are taking other medicines to treat rheumatoid arthritis  -COPD  -diabetes  -infection or history of infections  -recently received or scheduled to receive a vaccine  -scheduled to have surgery  -tuberculosis, a positive skin test for tuberculosis or have recently been in close contact with someone who has tuberculosis  -viral hepatitis  -an unusual or allergic reaction to abatacept, other medicines, foods, dyes, or preservatives  -pregnant or trying to get pregnant  -breast-feeding  How should I use this medicine?  This medicine is for infusion into a vein or for injection under the skin. Infusions are given by a health care professional in a hospital or clinic setting. If you are to give your own medicine at home, you will be taught how to prepare and give this medicine under the skin. Use exactly as directed. Take your medicine at regular intervals. Do not take your medicine more often than directed.  It is important that you put your used needles and syringes in a special sharps container. Do not put them in a trash can. If you do not have a sharps container, call your pharmacist or healthcare provider to get one.  Talk to your pediatrician regarding the use of this medicine in children. While infusions in a clinic may be prescribed for children as young as 2 years for selected conditions, precautions do apply.  Overdosage: If you think you have taken too much of  this medicine contact a poison control center or emergency room at once.  NOTE: This medicine is only for you. Do not share this medicine with others.  What if I miss a dose?  This medicine is used once a week if given by injection under the skin. If you miss a dose, take it as soon as you can. If it is almost time for your next dose, take only that dose. Do not take double or extra doses.  If you are to be given an infusion, it is important not to miss your dose. Doses are usually every 4 weeks. Call your doctor or health care professional if you are unable to keep an appointment.  What may interact with this medicine?  Do not take this medicine with any of the following medications:  -adalimumab  -anakinra  -certolizumab  -etanercept  -golimumab  -infliximab  -live virus vaccines  -rituximab  -tocilizumab  This medicine may also interact with the following medications:  -vaccines  This list may not describe all possible interactions. Give your health care provider a list of all the medicines, herbs, non-prescription drugs, or dietary supplements you use. Also tell them if you smoke, drink alcohol, or use illegal drugs. Some items may interact with your medicine.  What should I watch for while using this medicine?  Visit your doctor for regular check ups while you are taking this medicine. Tell your doctor or healthcare professional if your symptoms do not start to get better or if they   get worse.  Call your doctor or health care professional if you get a cold or other infection while receiving this medicine. Do not treat yourself. This medicine may decrease your body's ability to fight infection. Try to avoid being around people who are sick.  What side effects may I notice from receiving this medicine?  Side effects that you should report to your doctor or health care professional as soon as possible:  -allergic reactions like skin rash, itching or hives, swelling of the face, lips, or tongue  -breathing  problems  -chest pain  -signs of infection - fever or chills, cough, unusual tiredness, pain or trouble passing urine, or warm, red or painful skin  Side effects that usually do not require medical attention (report to your doctor or health care professional if they continue or are bothersome):  -dizziness  -headache  -nausea, vomiting  -sore throat  -stomach upset  This list may not describe all possible side effects. Call your doctor for medical advice about side effects. You may report side effects to FDA at 1-800-FDA-1088.  Where should I keep my medicine?  Infusions will be given in a hospital or clinic and will not be stored at home.  Storage for syringes given under the skin and stored at home:  Keep out of the reach of children. Store in a refrigerator between 2 and 8 degrees C (36 and 46 degrees F). Keep this medicine in the original container. Protect from light. Do not freeze. Throw away any unused medicine after the expiration date.  NOTE: This sheet is a summary. It may not cover all possible information. If you have questions about this medicine, talk to your doctor, pharmacist, or health care provider.   2018 Elsevier/Gold Standard (2015-11-11 10:07:35)

## 2016-09-13 NOTE — Telephone Encounter (Signed)
Called [employer] plan to verify the benefits for patients Orencia Infusion. Spoke with representative, Rena who states that the patient will no require a prior authorization for the service. Her benefits will cover and follow the rules of her Medicare plan.   Reference number: [id number]  Phone Number: [phone]   Abran Duke, CPhT 11:41 AM

## 2016-09-15 ENCOUNTER — Other Ambulatory Visit: Payer: Self-pay | Admitting: Radiology

## 2016-09-15 DIAGNOSIS — M0579 Rheumatoid arthritis with rheumatoid factor of multiple sites without organ or systems involvement: Secondary | ICD-10-CM

## 2016-09-15 NOTE — Telephone Encounter (Signed)
I spoke to Monica [name] at [facility] to verify patient's benefits.  He reports that under patient's [employer] Medicare Supplement plan the part B drugs plan will cover the $183 deductible then 20% of medicare's approved amount.  Patient will not have copay or coinsurance.    I advised patient of this information.  She wants to proceed with switching from Simponi Aria to Orencia infusions.  Most recent Simponi Aria infusion was 07/24/16.  She was scheduled for her next Simponi Aria infusion on 09/18/16, and will plan to start Orencia at that time.   Monica [name], can you update patient's infusion orders?  Please let me know when they are updated and I will call [name] to make sure they know patient is coming in for Orencia infusion not Simponi Aria.

## 2016-09-15 NOTE — Telephone Encounter (Addendum)
I spoke to [place] at [facility] infusion center regarding changing patient from [employer] to Sorrel.  She will change appointment to reflect new medication.    She asked that we call [phone] and let the nurse know once the new infusion order for Dub Amis has been placed.

## 2016-09-15 NOTE — Telephone Encounter (Signed)
Thank you I called the infusion center and the orders were discontinued for the Simponi Aria and now she will infuse with [facility]

## 2016-09-18 ENCOUNTER — Encounter (HOSPITAL_COMMUNITY)
Admission: RE | Admit: 2016-09-18 | Discharge: 2016-09-18 | Disposition: A | Discharge status: Home / Self Care | Payer: Medicare Other | Source: Ambulatory Visit | Attending: Rheumatology | Admitting: Rheumatology

## 2016-09-18 ENCOUNTER — Other Ambulatory Visit: Payer: Self-pay | Admitting: Rheumatology

## 2016-09-18 DIAGNOSIS — M0579 Rheumatoid arthritis with rheumatoid factor of multiple sites without organ or systems involvement: Secondary | ICD-10-CM | POA: Diagnosis not present

## 2016-09-18 LAB — COMPREHENSIVE METABOLIC PANEL
ALK PHOS: 62 U/L (ref 38–126)
ALT: 12 U/L — AB (ref 14–54)
ANION GAP: 8 (ref 5–15)
AST: 16 U/L (ref 15–41)
Albumin: 3.9 g/dL (ref 3.5–5.0)
BILIRUBIN TOTAL: 0.4 mg/dL (ref 0.3–1.2)
BUN: 18 mg/dL (ref 6–20)
CALCIUM: 9.2 mg/dL (ref 8.9–10.3)
CO2: 27 mmol/L (ref 22–32)
CREATININE: 0.66 mg/dL (ref 0.44–1.00)
Chloride: 100 mmol/L — ABNORMAL LOW (ref 101–111)
GFR calc non Af Amer: >60 mL/min (ref >60)
Glucose, Bld: 123 mg/dL — ABNORMAL HIGH (ref 65–99)
Potassium: 3.8 mmol/L (ref 3.5–5.1)
SODIUM: 135 mmol/L (ref 135–145)
TOTAL PROTEIN: 7.5 g/dL (ref 6.5–8.1)

## 2016-09-18 LAB — CBC WITH DIFFERENTIAL/PLATELET
Basophils Absolute: 0 10*3/uL (ref 0.0–0.1)
Basophils Relative: 0 %
EOS ABS: 0 10*3/uL (ref 0.0–0.7)
Eosinophils Relative: 0 %
HEMATOCRIT: 39.1 % (ref 36.0–46.0)
HEMOGLOBIN: 13 g/dL (ref 12.0–15.0)
LYMPHS ABS: 1.1 10*3/uL (ref 0.7–4.0)
LYMPHS PCT: 12 %
MCH: 31.6 pg (ref 26.0–34.0)
MCHC: 33.2 g/dL (ref 30.0–36.0)
MCV: 95.1 fL (ref 78.0–100.0)
MONOS PCT: 3 %
Monocytes Absolute: 0.3 10*3/uL (ref 0.1–1.0)
NEUTROS ABS: 8.2 10*3/uL — AB (ref 1.7–7.7)
NEUTROS PCT: 85 %
Platelets: 360 10*3/uL (ref 150–400)
RBC: 4.11 MIL/uL (ref 3.87–5.11)
RDW: 12.9 % (ref 11.5–15.5)
WBC: 9.6 10*3/uL (ref 4.0–10.5)

## 2016-09-18 MED ORDER — DIPHENHYDRAMINE HCL 25 MG PO CAPS: 25.0000 mg | ORAL_CAPSULE | ORAL | Status: DC

## 2016-09-18 MED ORDER — ACETAMINOPHEN 325 MG PO TABS: 650.0000 mg | ORAL_TABLET | ORAL | Status: DC

## 2016-09-18 MED ORDER — SODIUM CHLORIDE 0.9 % IV SOLN
1000.0000 mg | INTRAVENOUS | Status: DC
  Administered 2016-09-18: 1000 mg via INTRAVENOUS
  Filled 2016-09-18: qty 40

## 2016-09-18 MED ORDER — SODIUM CHLORIDE 0.9 % IV SOLN
INTRAVENOUS | Status: DC
  Administered 2016-09-18: 250 mL via INTRAVENOUS

## 2016-09-18 NOTE — Telephone Encounter (Signed)
Last Visit: 09/13/16 Next Visit: 12/13/16  Okay to refill Folic Acid?  

## 2016-09-18 NOTE — Progress Notes (Signed)
WNL

## 2016-09-18 NOTE — Progress Notes (Signed)
States she took her tylenol and benadryl at home before coming to hospital.

## 2016-09-18 NOTE — Telephone Encounter (Signed)
ok 

## 2016-09-18 NOTE — Progress Notes (Signed)
Voices no c/o at this time. D/C to home in good condition. 

## 2016-09-18 NOTE — Progress Notes (Signed)
Results for Monica [name], Monica [name] (MRN [id number]) as of 09/18/2016 12:08  Ref. Range 09/18/2016 10:25  COMPREHENSIVE METABOLIC PANEL Unknown Rpt (A)  Sodium Latest Ref Range: 135 - 145 mmol/L 135  Potassium Latest Ref Range: 3.5 - 5.1 mmol/L 3.8  Chloride Latest Ref Range: 101 - 111 mmol/L 100 (L)  CO2 Latest Ref Range: 22 - 32 mmol/L 27  Glucose Latest Ref Range: 65 - 99 mg/dL 123 (H)  BUN Latest Ref Range: 6 - 20 mg/dL 18  Creatinine Latest Ref Range: 0.44 - 1.00 mg/dL 0.66  Calcium Latest Ref Range: 8.9 - 10.3 mg/dL 9.2  Anion gap Latest Ref Range: 5 - 15  8  Alkaline Phosphatase Latest Ref Range: 38 - 126 U/L 62  Albumin Latest Ref Range: 3.5 - 5.0 g/dL 3.9  AST Latest Ref Range: 15 - 41 U/L 16  ALT Latest Ref Range: 14 - 54 U/L 12 (L)  Total Protein Latest Ref Range: 6.5 - 8.1 g/dL 7.5  Total Bilirubin Latest Ref Range: 0.3 - 1.2 mg/dL 0.4  EGFR (African American) Latest Ref Range: >60 mL/min >60  EGFR (Non-African Amer.) Latest Ref Range: >60 mL/min >60  WBC Latest Ref Range: 4.0 - 10.5 K/uL 9.6  RBC Latest Ref Range: 3.87 - 5.11 MIL/uL 4.11  Hemoglobin Latest Ref Range: 12.0 - 15.0 g/dL 13.0  HCT Latest Ref Range: 36.0 - 46.0 % 39.1  MCV Latest Ref Range: 78.0 - 100.0 fL 95.1  MCH Latest Ref Range: 26.0 - 34.0 pg 31.6  MCHC Latest Ref Range: 30.0 - 36.0 g/dL 33.2  RDW Latest Ref Range: 11.5 - 15.5 % 12.9  Platelets Latest Ref Range: 150 - 400 K/uL 360  Neutrophils Latest Units: % 85  Lymphocytes Latest Units: % 12  Monocytes Relative Latest Units: % 3  Eosinophil Latest Units: % 0  Basophil Latest Units: % 0  NEUT# Latest Ref Range: 1.7 - 7.7 K/uL 8.2 (H)  Lymphocyte # Latest Ref Range: 0.7 - 4.0 K/uL 1.1  Monocyte # Latest Ref Range: 0.1 - 1.0 K/uL 0.3  Eosinophils Absolute Latest Ref Range: 0.0 - 0.7 K/uL 0.0  Basophils Absolute Latest Ref Range: 0.0 - 0.1 K/uL 0.0

## 2016-09-21 DIAGNOSIS — L818 Other specified disorders of pigmentation: Secondary | ICD-10-CM | POA: Diagnosis not present

## 2016-09-21 DIAGNOSIS — L301 Dyshidrosis [pompholyx]: Secondary | ICD-10-CM | POA: Diagnosis not present

## 2016-09-29 ENCOUNTER — Telehealth: Payer: Self-pay | Admitting: *Deleted

## 2016-09-29 ENCOUNTER — Other Ambulatory Visit: Payer: Self-pay | Admitting: Radiology

## 2016-09-29 DIAGNOSIS — M0579 Rheumatoid arthritis with rheumatoid factor of multiple sites without organ or systems involvement: Secondary | ICD-10-CM

## 2016-09-29 NOTE — Telephone Encounter (Signed)
[facility] called for orders for Orencia Infusion. Patient has an appointment on Tuesday. Will you please place orders? Thanks!!

## 2016-09-29 NOTE — Telephone Encounter (Addendum)
Infusion orders updated today including CBC CMP Tylenol and Benadryl appointments are up to date and follow up appointment is scheduled TB gold is due on 07/24/17   Next 3 orders generated

## 2016-10-03 ENCOUNTER — Encounter (HOSPITAL_COMMUNITY): Payer: Self-pay

## 2016-10-03 ENCOUNTER — Encounter (HOSPITAL_COMMUNITY)
Admission: RE | Admit: 2016-10-03 | Discharge: 2016-10-03 | Disposition: A | Discharge status: Home / Self Care | Payer: Medicare Other | Source: Ambulatory Visit | Attending: Rheumatology | Admitting: Rheumatology

## 2016-10-03 DIAGNOSIS — M0579 Rheumatoid arthritis with rheumatoid factor of multiple sites without organ or systems involvement: Secondary | ICD-10-CM

## 2016-10-03 MED ORDER — DIPHENHYDRAMINE HCL 25 MG PO CAPS: 25.0000 mg | ORAL_CAPSULE | ORAL | Status: DC

## 2016-10-03 MED ORDER — SODIUM CHLORIDE 0.9 % IV SOLN
INTRAVENOUS | Status: DC
  Administered 2016-10-03: 10:00:00 via INTRAVENOUS

## 2016-10-03 MED ORDER — ACETAMINOPHEN 325 MG PO TABS: 650.0000 mg | ORAL_TABLET | ORAL | Status: DC

## 2016-10-03 MED ORDER — SODIUM CHLORIDE 0.9 % IV SOLN
1000.0000 mg | INTRAVENOUS | Status: DC
  Administered 2016-10-03: 1000 mg via INTRAVENOUS
  Filled 2016-10-03: qty 40

## 2016-10-03 NOTE — Progress Notes (Signed)
Please put in an order for the frequency of the orencia. Order states every 28 days but patient has no order frequency for initial dosing.

## 2016-10-04 ENCOUNTER — Other Ambulatory Visit: Payer: Self-pay | Admitting: Radiology

## 2016-10-04 DIAGNOSIS — M0579 Rheumatoid arthritis with rheumatoid factor of multiple sites without organ or systems involvement: Secondary | ICD-10-CM

## 2016-10-04 NOTE — Progress Notes (Signed)
Infusion orders updated today including CBC CMP Tylenol and Benadryl appointments are up to date and follow up appointment is scheduled TB gold is due on 07/24/17  Loading doses were put in beginning of the month. Have put in next 3 orders/ standing orders

## 2016-10-09 ENCOUNTER — Telehealth: Payer: Self-pay | Admitting: Rheumatology

## 2016-10-09 ENCOUNTER — Other Ambulatory Visit: Payer: Self-pay | Admitting: Rheumatology

## 2016-10-09 ENCOUNTER — Ambulatory Visit (INDEPENDENT_AMBULATORY_CARE_PROVIDER_SITE_OTHER): Payer: Medicare Other | Admitting: Internal Medicine

## 2016-10-09 ENCOUNTER — Encounter: Payer: Self-pay | Admitting: Internal Medicine

## 2016-10-09 DIAGNOSIS — J4530 Mild persistent asthma, uncomplicated: Secondary | ICD-10-CM | POA: Diagnosis not present

## 2016-10-09 DIAGNOSIS — J4531 Mild persistent asthma with (acute) exacerbation: Secondary | ICD-10-CM | POA: Diagnosis not present

## 2016-10-09 MED ORDER — FLUTICASONE-UMECLIDIN-VILANT 100-62.5-25 MCG/INH IN AEPB: 1.0000 | INHALATION_SPRAY | Freq: Every day | RESPIRATORY_TRACT | 12 refills | Status: DC

## 2016-10-09 NOTE — Telephone Encounter (Addendum)
Patient advised labs were not drawn with her last infusion. Patient advised that her labs were only drawn every other infusion. Patient verbalized understanding. Patient states she had a Dexa scan done in July 2016. Patient states she believes the recommendation was to have a repeat scan in 2 years. According to [facility] she is to have a repeat scan in 11/2016. Patient states she will have that appointment scheduled.

## 2016-10-09 NOTE — Telephone Encounter (Signed)
Last Visit: 09/13/16 Next Visit: 12/13/16 Labs: 09/18/16 WNL PLQ Eye Exam: 08/21/15 which was normal  Okay to refill PLQ?

## 2016-10-09 NOTE — Patient Instructions (Signed)
Script for Trelegy sent to drug store. If insurance won't cover this please let us know.    Please call as needed

## 2016-10-09 NOTE — Progress Notes (Signed)
HPI female never smoker followed for asthma, allergic rhinitis, complicated by rheumatoid arthritis PFT 02/02/09-mild obstruction small airways, minimal response bronchodilator, DLCO mildly reduced. FVC 3.36/116%, FEV1 2.48/119%, ratio 0.74, FEF 25-75% 1.80/75%, TLC 108%, DLCO 70% Office Spirometry 09/08/16-WNL-FVC 2.63/98%, FEV1 1.91/95%, ratio 0.73, FEF 25-75% 1.32/82% --------------------------------------------------------------------------------------  11/01/2015-76 year old female never smoker followed for asthma, allergic rhinitis, complicated by rheumatoid arthritis FOLLOWS FOR: pt states breathing is doing well. pt c/o cont prod cough w/clear mucus. cough has improved since stopping arava. Increased cough during the spring was reportedly associated with a rheumatoid arthritis medication( Arava) and resolved when that was discontinued. Still has some baseline cough, dry, not relieved by cough syrup and cough drops. Sometimes aware of minor asthma with occasional use of rescue inhaler.  Takes Lasix 20 mg occasionally for edema in her feet. CXR 08/26/2015 FINDINGS: The lungs are clear. The heart size and pulmonary vascularity are normal. No adenopathy. There is mild degenerative change in the thoracic spine. IMPRESSION: No edema or consolidation. Electronically Signed  By: Bretta Bang III M.D.  On: 08/26/2015 15:46  09/08/16- 76 year old female never smoker followed for asthma, chronic cough, allergic rhinitis, complicated by rheumatoid arthritis FOLLOW FOR patient continues to have sob and wheezing with little exertion  Wheezed all winter with some benefit from rescue inhaler used several times a day. Denies symptoms suggesting rhinitis or sinusitis now or throughout the winter. Doing better now than she was. Denies postnasal drip or reflux. Not sure that Anoro worked any better than [employer]. Office Spirometry 09/08/16-WNL-FVC 2.63/98%, FEV1 1.91/95%, ratio 0.73, FEF 25-75%  1.32/82%  10/09/16- 76 year old female never smoker followed for asthma, chronic cough, allergic rhinitis, complicated by rheumatoid arthritis FOLLOWS FOR:Pt states she is doing much better and likes Trelegy much better. She rapidly got better within just a few days after starting Trelegy sample. When she finally ran out of Trelegy she went back to [facility], but has noted some wheezing at night. She very much wants to be back on Trelegy. CXR 09/08/16- Stable exam.  No active cardiopulmonary disease  ROS-see HPI  Constitutional:   No-   weight loss, night sweats, fevers, chills, fatigue, lassitude. HEENT:   No-  headaches, difficulty swallowing, tooth/dental problems, sore throat,       No-  sneezing, itching, ear ache, nasal congestion, post nasal drip,  CV:  No-   chest pain, orthopnea, PND, +swelling in lower extremities, anasarca, dizziness, palpitations Resp:   + shortness of breath with exertion or at rest.              No-   productive cough,  + non-productive cough,  No- coughing up of blood.              No-   change in color of mucus.  +rare wheezing.   Skin: No-   rash or lesions. GI:  No-   heartburn, indigestion, abdominal pain, nausea, vomiting,  GU:  MS: + joint pain or swelling.   Neuro-     nothing unusual Psych:  No- change in mood or affect. No depression or anxiety.  No memory loss.  OBJ       stable baseline exam General- Alert, Oriented, Affect-appropriate, Distress- none acute. + Morbidly obese Skin- rash-none, lesions- none, excoriation- none Lymphadenopathy- none Head- atraumatic            Eyes- Gross vision intact, PERRLA, conjunctivae clear secretions            Ears- Hearing, canals-normal  Nose- Clear, no-Septal dev, mucus, polyps, erosion, perforation             Throat- Mallampati III , mucosa clear , drainage- none, tonsils- atrophic. +Easy gag. Neck- flexible , trachea midline, no stridor , thyroid nl, carotid no bruit Chest - symmetrical excursion  , unlabored           Heart/CV- RRR , no murmur , no gallop  , no rub, nl s1 s2                           - JVD- none , edema- none, stasis changes- none, varices- none           Lung- clear, wheeze- none, cough- none , dullness-none, rub- none           Chest wall-  Abd-  Br/ Gen/ Rectal- Not done, not indicated Extrem- cyanosis- none, clubbing, none, atrophy- none, strength- nl Neuro- grossly intact to observation

## 2016-10-09 NOTE — Telephone Encounter (Signed)
ok 

## 2016-10-09 NOTE — Assessment & Plan Note (Signed)
She finds Trelegy significantly better than Anoro. The difference is the cortisone component presumably. We discussed the medication components. I suggested she might be able to drop off of Trelegy again to something simpler as step therapy later.

## 2016-10-09 NOTE — Telephone Encounter (Signed)
Patient would like Amy to give her a call.  CB#[phone].  Thank you.

## 2016-10-10 DIAGNOSIS — F339 Major depressive disorder, recurrent, unspecified: Secondary | ICD-10-CM | POA: Diagnosis not present

## 2016-10-10 DIAGNOSIS — M069 Rheumatoid arthritis, unspecified: Secondary | ICD-10-CM | POA: Diagnosis not present

## 2016-10-10 DIAGNOSIS — Z1389 Encounter for screening for other disorder: Secondary | ICD-10-CM | POA: Diagnosis not present

## 2016-10-10 DIAGNOSIS — E7800 Pure hypercholesterolemia, unspecified: Secondary | ICD-10-CM | POA: Diagnosis not present

## 2016-10-10 DIAGNOSIS — R7301 Impaired fasting glucose: Secondary | ICD-10-CM | POA: Diagnosis not present

## 2016-10-10 DIAGNOSIS — J45909 Unspecified asthma, uncomplicated: Secondary | ICD-10-CM | POA: Diagnosis not present

## 2016-10-10 DIAGNOSIS — F419 Anxiety disorder, unspecified: Secondary | ICD-10-CM | POA: Diagnosis not present

## 2016-10-10 DIAGNOSIS — Z6841 Body Mass Index (BMI) 40.0 and over, adult: Secondary | ICD-10-CM | POA: Diagnosis not present

## 2016-10-10 DIAGNOSIS — I10 Essential (primary) hypertension: Secondary | ICD-10-CM | POA: Diagnosis not present

## 2016-10-13 ENCOUNTER — Telehealth: Payer: Self-pay | Admitting: Internal Medicine

## 2016-10-13 NOTE — Telephone Encounter (Signed)
Received PA request from patient's pharmacy for Trelegy. PA started in [facility]. Key is [id number]. Per CMM, a determination could take up to 3 days.  Will route to [facility] for follow up.

## 2016-10-13 NOTE — Telephone Encounter (Signed)
Please remove Trelegy from her med list Add back Anoro # 1, inhale 1 puff, once daily, refill x 12  Add Spiriva Respimat # 1, inhale 2 puffs, once daily  Together Anoro and Sspiriva will copy the chemistry in the Trelegy since her insurance won't let her have that.

## 2016-10-13 NOTE — Telephone Encounter (Signed)
PA for Trelegy has been denied. Awaiting papers from insurance.

## 2016-10-14 ENCOUNTER — Encounter: Payer: Self-pay | Admitting: Internal Medicine

## 2016-10-16 ENCOUNTER — Encounter (HOSPITAL_COMMUNITY)
Admission: RE | Admit: 2016-10-16 | Discharge: 2016-10-16 | Disposition: A | Discharge status: Home / Self Care | Payer: Medicare Other | Source: Ambulatory Visit | Attending: Rheumatology | Admitting: Rheumatology

## 2016-10-16 DIAGNOSIS — M0579 Rheumatoid arthritis with rheumatoid factor of multiple sites without organ or systems involvement: Secondary | ICD-10-CM | POA: Diagnosis not present

## 2016-10-16 MED ORDER — SODIUM CHLORIDE 0.9 % IV SOLN
1000.0000 mg | INTRAVENOUS | Status: DC
  Administered 2016-10-16: 1000 mg via INTRAVENOUS
  Filled 2016-10-16: qty 40

## 2016-10-16 MED ORDER — SODIUM CHLORIDE 0.9 % IV SOLN
INTRAVENOUS | Status: DC
  Administered 2016-10-16: 250 mL via INTRAVENOUS

## 2016-10-16 MED ORDER — TIOTROPIUM BROMIDE-OLODATEROL 2.5-2.5 MCG/ACT IN AERS: 2.0000 | INHALATION_SPRAY | Freq: Every day | RESPIRATORY_TRACT | 5 refills | Status: DC

## 2016-10-16 NOTE — Telephone Encounter (Signed)
CY - please advise. Thanks! 

## 2016-10-16 NOTE — Progress Notes (Signed)
States took benadryl and tylenol at home before coming to hospital. CBC and CMET not done [place]. CBC and CMET done last week at PCP office. Pt will call PCP office and have results sent to Dr Bedelia Person.

## 2016-10-16 NOTE — Telephone Encounter (Signed)
Please let her try sample Stiolto     Inhale 2 puffs, once daily

## 2016-10-16 NOTE — Addendum Note (Signed)
Addended by: Jaynee Eagles C on: 10/16/2016 02:18 PM   Modules accepted: Orders

## 2016-10-29 ENCOUNTER — Encounter: Payer: Self-pay | Admitting: Rheumatology

## 2016-10-30 ENCOUNTER — Telehealth: Payer: Self-pay | Admitting: *Deleted

## 2016-10-30 MED ORDER — PREDNISONE 5 MG PO TABS: ORAL_TABLET | ORAL | 0 refills | Status: DC

## 2016-10-30 NOTE — Telephone Encounter (Signed)
Please give her another Pred taper as rxd before

## 2016-10-30 NOTE — Telephone Encounter (Signed)
error 

## 2016-10-31 ENCOUNTER — Other Ambulatory Visit: Payer: Self-pay | Admitting: Radiology

## 2016-10-31 DIAGNOSIS — M0579 Rheumatoid arthritis with rheumatoid factor of multiple sites without organ or systems involvement: Secondary | ICD-10-CM

## 2016-10-31 NOTE — Progress Notes (Signed)
Orencia Infusion orders updated today including CBC CMP Tylenol and Benadryl appointments are up to date and follow up appointment is scheduled TB gold is due on 07/2017

## 2016-11-02 ENCOUNTER — Encounter: Payer: Self-pay | Admitting: Internal Medicine

## 2016-11-02 NOTE — Telephone Encounter (Signed)
Will route to CY as a FYI.

## 2016-11-13 ENCOUNTER — Encounter (HOSPITAL_COMMUNITY)
Admission: RE | Admit: 2016-11-13 | Discharge: 2016-11-13 | Disposition: A | Discharge status: Home / Self Care | Payer: Medicare Other | Source: Ambulatory Visit | Attending: Rheumatology | Admitting: Rheumatology

## 2016-11-13 DIAGNOSIS — M069 Rheumatoid arthritis, unspecified: Secondary | ICD-10-CM | POA: Insufficient documentation

## 2016-11-13 DIAGNOSIS — M0579 Rheumatoid arthritis with rheumatoid factor of multiple sites without organ or systems involvement: Secondary | ICD-10-CM

## 2016-11-13 LAB — COMPREHENSIVE METABOLIC PANEL WITH GFR
ALT: 13 U/L — ABNORMAL LOW (ref 14–54)
AST: 19 U/L (ref 15–41)
Albumin: 3.9 g/dL (ref 3.5–5.0)
Alkaline Phosphatase: 54 U/L (ref 38–126)
Anion gap: 7 (ref 5–15)
BUN: 15 mg/dL (ref 6–20)
CO2: 29 mmol/L (ref 22–32)
Calcium: 8.9 mg/dL (ref 8.9–10.3)
Chloride: 101 mmol/L (ref 101–111)
Creatinine, Ser: 0.76 mg/dL (ref 0.44–1.00)
GFR calc Af Amer: >60 mL/min
GFR calc non Af Amer: >60 mL/min
Glucose, Bld: 92 mg/dL (ref 65–99)
Potassium: 3.8 mmol/L (ref 3.5–5.1)
Sodium: 137 mmol/L (ref 135–145)
Total Bilirubin: 1 mg/dL (ref 0.3–1.2)
Total Protein: 6.9 g/dL (ref 6.5–8.1)

## 2016-11-13 LAB — CBC WITH DIFFERENTIAL/PLATELET
BASOS PCT: 0 %
Basophils Absolute: 0 10*3/uL (ref 0.0–0.1)
EOS ABS: 0.2 10*3/uL (ref 0.0–0.7)
Eosinophils Relative: 2 %
HCT: 38.8 % (ref 36.0–46.0)
HEMOGLOBIN: 12.8 g/dL (ref 12.0–15.0)
Lymphocytes Relative: 22 %
Lymphs Abs: 2.1 10*3/uL (ref 0.7–4.0)
MCH: 32 pg (ref 26.0–34.0)
MCHC: 33 g/dL (ref 30.0–36.0)
MCV: 97 fL (ref 78.0–100.0)
MONOS PCT: 8 %
Monocytes Absolute: 0.7 10*3/uL (ref 0.1–1.0)
NEUTROS PCT: 68 %
Neutro Abs: 6.2 10*3/uL (ref 1.7–7.7)
Platelets: 298 10*3/uL (ref 150–400)
RBC: 4 MIL/uL (ref 3.87–5.11)
RDW: 13.6 % (ref 11.5–15.5)
WBC: 9.2 10*3/uL (ref 4.0–10.5)

## 2016-11-13 MED ORDER — ACETAMINOPHEN 325 MG PO TABS: 650.0000 mg | ORAL_TABLET | ORAL | Status: DC

## 2016-11-13 MED ORDER — SODIUM CHLORIDE 0.9 % IV SOLN
1000.0000 mg | INTRAVENOUS | Status: DC
  Administered 2016-11-13: 1000 mg via INTRAVENOUS
  Filled 2016-11-13: qty 40

## 2016-11-13 MED ORDER — DIPHENHYDRAMINE HCL 25 MG PO CAPS: 25.0000 mg | ORAL_CAPSULE | ORAL | Status: DC

## 2016-11-13 NOTE — Progress Notes (Signed)
Results for HIILANI, JETTER (MRN [id number]) as of 11/13/2016 11:21  Ref. Range 11/13/2016 09:55  COMPREHENSIVE METABOLIC PANEL Unknown Rpt (A)  Sodium Latest Ref Range: 135 - 145 mmol/L 137  Potassium Latest Ref Range: 3.5 - 5.1 mmol/L 3.8  Chloride Latest Ref Range: 101 - 111 mmol/L 101  CO2 Latest Ref Range: 22 - 32 mmol/L 29  Glucose Latest Ref Range: 65 - 99 mg/dL 92  BUN Latest Ref Range: 6 - 20 mg/dL 15  Creatinine Latest Ref Range: 0.44 - 1.00 mg/dL 7.78  Calcium Latest Ref Range: 8.9 - 10.3 mg/dL 8.9  Anion gap Latest Ref Range: 5 - 15  7  Alkaline Phosphatase Latest Ref Range: 38 - 126 U/L 54  Albumin Latest Ref Range: 3.5 - 5.0 g/dL 3.9  AST Latest Ref Range: 15 - 41 U/L 19  ALT Latest Ref Range: 14 - 54 U/L 13 (L)  Total Protein Latest Ref Range: 6.5 - 8.1 g/dL 6.9  Total Bilirubin Latest Ref Range: 0.3 - 1.2 mg/dL 1.0  GFR, Est African American Latest Ref Range: >60 mL/min >60  GFR, Est Non African American Latest Ref Range: >60 mL/min >60  WBC Latest Ref Range: 4.0 - 10.5 K/uL 9.2  RBC Latest Ref Range: 3.87 - 5.11 MIL/uL 4.00  Hemoglobin Latest Ref Range: 12.0 - 15.0 g/dL 24.2  HCT Latest Ref Range: 36.0 - 46.0 % 38.8  MCV Latest Ref Range: 78.0 - 100.0 fL 97.0  MCH Latest Ref Range: 26.0 - 34.0 pg 32.0  MCHC Latest Ref Range: 30.0 - 36.0 g/dL 35.3  RDW Latest Ref Range: 11.5 - 15.5 % 13.6  Platelets Latest Ref Range: 150 - 400 K/uL 298  Neutrophils Latest Units: % 68  Lymphocytes Latest Units: % 22  Monocytes Relative Latest Units: % 8  Eosinophil Latest Units: % 2  Basophil Latest Units: % 0  NEUT# Latest Ref Range: 1.7 - 7.7 K/uL 6.2  Lymphocyte # Latest Ref Range: 0.7 - 4.0 K/uL 2.1  Monocyte # Latest Ref Range: 0.1 - 1.0 K/uL 0.7  Eosinophils Absolute Latest Ref Range: 0.0 - 0.7 K/uL 0.2  Basophils Absolute Latest Ref Range: 0.0 - 0.1 K/uL 0.0   Orencia 1000 mg IV given per MD order. Pt tolerated medication well and without incident.

## 2016-11-13 NOTE — Progress Notes (Signed)
Within normal limits

## 2016-11-16 DIAGNOSIS — Z1231 Encounter for screening mammogram for malignant neoplasm of breast: Secondary | ICD-10-CM | POA: Diagnosis not present

## 2016-11-16 DIAGNOSIS — M8589 Other specified disorders of bone density and structure, multiple sites: Secondary | ICD-10-CM | POA: Diagnosis not present

## 2016-11-24 ENCOUNTER — Telehealth: Payer: Self-pay

## 2016-11-24 NOTE — Telephone Encounter (Signed)
Patient called stating that she had left a message for Sue Lush concerning her Bone Denisty Test.  Patient would like a CB at [phone].

## 2016-11-27 ENCOUNTER — Ambulatory Visit: Payer: Medicare Other | Admitting: Rheumatology

## 2016-11-29 NOTE — Telephone Encounter (Signed)
Patient states she would to know the comparison of her last bone density scan to the one she had 2 years ago. Patient will be coming in for a visit on 12/13/16 and is okay to wait until that appointment to discuss.

## 2016-12-05 ENCOUNTER — Other Ambulatory Visit: Payer: Self-pay | Admitting: Radiology

## 2016-12-05 DIAGNOSIS — M0579 Rheumatoid arthritis with rheumatoid factor of multiple sites without organ or systems involvement: Secondary | ICD-10-CM

## 2016-12-05 NOTE — Progress Notes (Signed)
Infusion orders updated today including CBC CMP Tylenol and Benadryl appointments are up to date and a follow up appointment is scheduled/ TB gold is due on 07/2017

## 2016-12-11 ENCOUNTER — Encounter (HOSPITAL_COMMUNITY)
Admission: RE | Admit: 2016-12-11 | Discharge: 2016-12-11 | Disposition: A | Discharge status: Home / Self Care | Payer: Medicare Other | Source: Ambulatory Visit | Attending: Rheumatology | Admitting: Rheumatology

## 2016-12-11 ENCOUNTER — Encounter (HOSPITAL_COMMUNITY): Payer: Self-pay

## 2016-12-11 ENCOUNTER — Other Ambulatory Visit: Payer: Self-pay | Admitting: Rheumatology

## 2016-12-11 DIAGNOSIS — Z8659 Personal history of other mental and behavioral disorders: Secondary | ICD-10-CM | POA: Insufficient documentation

## 2016-12-11 DIAGNOSIS — M0579 Rheumatoid arthritis with rheumatoid factor of multiple sites without organ or systems involvement: Secondary | ICD-10-CM

## 2016-12-11 LAB — COMPREHENSIVE METABOLIC PANEL
ALBUMIN: 3.8 g/dL (ref 3.5–5.0)
ALK PHOS: 61 U/L (ref 38–126)
ALT: 12 U/L — AB (ref 14–54)
ANION GAP: 8 (ref 5–15)
AST: 20 U/L (ref 15–41)
BILIRUBIN TOTAL: 0.7 mg/dL (ref 0.3–1.2)
BUN: 15 mg/dL (ref 6–20)
CALCIUM: 9.2 mg/dL (ref 8.9–10.3)
CO2: 27 mmol/L (ref 22–32)
Chloride: 102 mmol/L (ref 101–111)
Creatinine, Ser: 0.81 mg/dL (ref 0.44–1.00)
GFR calc Af Amer: >60 mL/min (ref >60)
GFR calc non Af Amer: >60 mL/min (ref >60)
GLUCOSE: 93 mg/dL (ref 65–99)
Potassium: 3.8 mmol/L (ref 3.5–5.1)
Sodium: 137 mmol/L (ref 135–145)
TOTAL PROTEIN: 7 g/dL (ref 6.5–8.1)

## 2016-12-11 LAB — CBC
HEMATOCRIT: 38.8 % (ref 36.0–46.0)
HEMOGLOBIN: 13 g/dL (ref 12.0–15.0)
MCH: 32.2 pg (ref 26.0–34.0)
MCHC: 33.5 g/dL (ref 30.0–36.0)
MCV: 96 fL (ref 78.0–100.0)
Platelets: 306 10*3/uL (ref 150–400)
RBC: 4.04 MIL/uL (ref 3.87–5.11)
RDW: 13 % (ref 11.5–15.5)
WBC: 7.9 10*3/uL (ref 4.0–10.5)

## 2016-12-11 MED ORDER — DIPHENHYDRAMINE HCL 25 MG PO CAPS: 25.0000 mg | ORAL_CAPSULE | ORAL | Status: DC

## 2016-12-11 MED ORDER — SODIUM CHLORIDE 0.9 % IV SOLN
1000.0000 mg | INTRAVENOUS | Status: DC
  Administered 2016-12-11: 1000 mg via INTRAVENOUS
  Filled 2016-12-11: qty 40

## 2016-12-11 MED ORDER — ACETAMINOPHEN 325 MG PO TABS: 650.0000 mg | ORAL_TABLET | ORAL | Status: DC

## 2016-12-11 MED ORDER — SODIUM CHLORIDE 0.9 % IV SOLN
INTRAVENOUS | Status: DC
  Administered 2016-12-11: 11:00:00 via INTRAVENOUS

## 2016-12-11 NOTE — Progress Notes (Signed)
wnl

## 2016-12-11 NOTE — Progress Notes (Signed)
Office Visit Note  Patient: Monica [name]             Date of Birth: 01-14-1941           MRN: [id number]             PCP: Juluis Rainier, MD Referring: Juluis Rainier, MD Visit Date: 12/13/2016 Occupation: @GUAROCC @    Subjective:  Pain hands and wrist.   History of Present Illness: Monica [name] is a 76 y.o. female with history of sero positive rheumatoid arthritis. She's been on IV Orencia since May 2018. She believes she is overall doing better but she continues to have some discomfort in her bilateral hands and her left wrist. She states she has had to taper off prednisone which helped her symptoms. She's been off prednisone for about 2 weeks now.  Activities of Daily Living:  Patient reports morning stiffness for 30 minutes.   Patient Denies nocturnal pain.  Difficulty dressing/grooming: Denies Difficulty climbing stairs: Denies Difficulty getting out of chair: Denies Difficulty using hands for taps, buttons, cutlery, and/or writing: Reports   Review of Systems  Constitutional: Positive for fatigue. Negative for night sweats, weight gain, weight loss and weakness.  HENT: Negative for mouth sores, trouble swallowing, trouble swallowing, mouth dryness and nose dryness.   Eyes: Positive for dryness. Negative for pain, redness and visual disturbance.  Respiratory: Positive for shortness of breath. Negative for cough and difficulty breathing.        H/o asthma  Cardiovascular: Negative for chest pain, palpitations, hypertension, irregular heartbeat and swelling in legs/feet.  Gastrointestinal: Negative for blood in stool, constipation and diarrhea.  Endocrine: Negative for increased urination.  Genitourinary: Negative for vaginal dryness.  Musculoskeletal: Positive for arthralgias, joint pain and morning stiffness. Negative for joint swelling, myalgias, muscle weakness, muscle tenderness and myalgias.  Skin: Negative for color change, rash, hair loss, skin  tightness, ulcers and sensitivity to sunlight.  Allergic/Immunologic: Negative for susceptible to infections.  Neurological: Negative for dizziness, memory loss and night sweats.  Hematological: Negative for swollen glands.  Psychiatric/Behavioral: Positive for sleep disturbance. Negative for depressed mood. The patient is not nervous/anxious.     PMFS History:  Patient Active Problem List   Diagnosis Date Noted  . History of depression 12/11/2016  . Asthma, mild persistent 09/08/2016  . High risk medication use 05/15/2016  . Primary osteoarthritis of both hands 05/15/2016  . Primary osteoarthritis of both feet 05/15/2016  . Primary osteoarthritis of both knees 05/15/2016  . Vitamin D deficiency 05/15/2016  . History of hypertension 05/15/2016  . History of hyperlipidemia 05/15/2016  . Rheumatoid arthritis with rheumatoid factor of multiple sites without organ or systems involvement (HCC) 01/10/2016  . Seasonal allergic rhinitis 10/03/2011  . HYPERLIPIDEMIA 12/31/2008  . HYPERTENSION 12/31/2008  . Mild persistent asthmatic bronchitis with exacerbation 12/31/2008    Past Medical History:  Diagnosis Date  . Arthritis   . 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 Hyperlipidemia   . Hypertension     Family History  Problem Relation Age of Onset  . Lung cancer Father    Past Surgical History:  Procedure Laterality Date  . CARPAL TUNNEL RELEASE    . FOOT SURGERY     Social History   Social History Narrative  . No narrative on file     Objective: Vital Signs: BP 134/78 (BP Location: Left Arm, Patient Position: Sitting, Cuff Size: Normal)   Pulse 71  Ht 5\' 3"  (1.6 m)   Wt 241 lb (109.3 kg)   BMI 42.69 kg/m    Physical Exam  Constitutional: She is oriented to person, place, and time. She appears well-developed and well-nourished.  HENT:  Head: Normocephalic and atraumatic.  Eyes: Conjunctivae and EOM are normal.  Neck: Normal range of motion.    Cardiovascular: Normal rate, regular rhythm, normal heart sounds and intact distal pulses.   Pulmonary/Chest: Effort normal and breath sounds normal.  Abdominal: Soft. Bowel sounds are normal.  Lymphadenopathy:    She has no cervical adenopathy.  Neurological: She is alert and oriented to person, place, and time.  Skin: Skin is warm and dry. Capillary refill takes less than 2 seconds.  Psychiatric: She has a normal mood and affect. Her behavior is normal.  Nursing note and vitals reviewed.    Musculoskeletal Exam: C-spine and thoracic lumbar spine good range of motion. Shoulder joints elbow joints wrist joints are good range of motion. She is some tenderness on palpation of her left wrist joint but no synovitis was noted. MCPs did not reveal any synovitis as well. She has some DIP PIP thickening of her hands consistent with osteoarthritis. Hip joints knee joints ankles MTPs PIPs with good range of motion with no synovitis.  CDAI Exam: CDAI Homunculus Exam:   Tenderness:  LUE: wrist  Joint Counts:  CDAI Tender Joint count: 1 CDAI Swollen Joint count: 0  Global Assessments:  Patient Global Assessment: 3 Provider Global Assessment: 3  CDAI Calculated Score: 7    Investigation: Findings:  TB gold negative 07/24/16  CBC Latest Ref Rng & Units 12/11/2016 11/13/2016 09/18/2016  WBC 4.0 - 10.5 K/uL 7.9 9.2 9.6  Hemoglobin 12.0 - 15.0 g/dL 09/20/2016 85.8 85.0  Hematocrit 36.0 - 46.0 % 38.8 38.8 39.1  Platelets 150 - 400 K/uL 306 298 360    CMP Latest Ref Rng & Units 12/11/2016 11/13/2016 09/18/2016  Glucose 65 - 99 mg/dL 93 92 09/20/2016)  BUN 6 - 20 mg/dL 15 15 18   Creatinine 0.44 - 1.00 mg/dL 412(I 7.86  Sodium 135 - 145 mmol/L 137 137 135  Potassium 3.5 - 5.1 mmol/L 3.8 3.8 3.8  Chloride 101 - 111 mmol/L 102 101 100(L)  CO2 22 - 32 mmol/L 27 29 27   Calcium 8.9 - 10.3 mg/dL 9.2 8.9 9.2  Total Protein 6.5 - 8.1 g/dL 7.0 6.9 7.5  Total Bilirubin 0.3 - 1.2 mg/dL 0.7 1.0 0.4  Alkaline Phos  38 - 126 U/L 61 54 62  AST 15 - 41 U/L 20 19 16   ALT 14 - 54 U/L 12(L) 13(L) 12(L)     Imaging: No results found.  Speciality Comments: No specialty comments available.    Procedures:  No procedures performed Allergies: Arava [leflunomide] and Piroxicam   Assessment / Plan:     Visit Diagnoses: Rheumatoid arthritis with rheumatoid factor of multiple sites without organ or systems involvement +CCP [facility]): Patient has no synovitis on examination. She's been having some left wrist joint discomfort. I've given her a prescription for left wrist joint brace. I offered cortisone injection which she declined. Have discouraged the use of prednisone tapers. Side effects of prednisone use was discussed.  High risk medication use - Orencia IV(09/2016) and Plaquenil 300 mg po qd ( has had inadequate response to Simponi Aria, Reaction to Arava, Methotrexate caused oral ulcers ). Her labs are stable we'll continue to monitor her labs with her infusions.  Primary osteoarthritis of both hands: She  does have osteoarthritis in her hands which causes discomfort. Joint protection was discussed. Also list of natural anti-inflammatories were given.  Primary osteoarthritis of both knees: She's not having much discomfort in her knees  Primary osteoarthritis of both feet: She is using proper fitting shoes  Vitamin D deficiency: On supplement  History of hypertension: Her blood pressure is controlled  History of asthma and seasonal allergies   History of depression  Osteopenia of multiple sites - DXA 11/2014 T-2.1 done by PCP. patient had a repeat DEXA by her PCP I do not have the results available according to patient the DEXA scan showed improvement in her BMD.   Orders: No orders of the defined types were placed in this encounter.  No orders of the defined types were placed in this encounter.   Face-to-face time spent with patient was 30 minutes. Greater than 50% of time was spent in counseling  and coordination of care.  Follow-Up Instructions: Return in about 5 months (around 05/15/2017) for Rheumatoid arthritis.   Pollyann Savoy, MD  Note - This record has been created using Animal nutritionist.  Chart creation errors have been sought, but may not always  have been located. Such creation errors do not reflect on  the standard of medical care.

## 2016-12-12 NOTE — Telephone Encounter (Signed)
Last Visit: 09/13/16 Next Visit: 12/13/16  Okay to refill per Dr. Corliss Skains

## 2016-12-13 ENCOUNTER — Encounter: Payer: Self-pay | Admitting: Rheumatology

## 2016-12-13 ENCOUNTER — Ambulatory Visit (INDEPENDENT_AMBULATORY_CARE_PROVIDER_SITE_OTHER): Payer: Medicare Other | Admitting: Rheumatology

## 2016-12-13 ENCOUNTER — Ambulatory Visit: Payer: Medicare Other | Admitting: Rheumatology

## 2016-12-13 VITALS — BP 134/78 | HR 71 | Ht 63.0 in | Wt 241.0 lb

## 2016-12-13 DIAGNOSIS — M8589 Other specified disorders of bone density and structure, multiple sites: Secondary | ICD-10-CM

## 2016-12-13 DIAGNOSIS — M19042 Primary osteoarthritis, left hand: Secondary | ICD-10-CM

## 2016-12-13 DIAGNOSIS — Z8659 Personal history of other mental and behavioral disorders: Secondary | ICD-10-CM

## 2016-12-13 DIAGNOSIS — M19072 Primary osteoarthritis, left ankle and foot: Secondary | ICD-10-CM | POA: Diagnosis not present

## 2016-12-13 DIAGNOSIS — M19041 Primary osteoarthritis, right hand: Secondary | ICD-10-CM | POA: Diagnosis not present

## 2016-12-13 DIAGNOSIS — M170 Bilateral primary osteoarthritis of knee: Secondary | ICD-10-CM | POA: Diagnosis not present

## 2016-12-13 DIAGNOSIS — M19071 Primary osteoarthritis, right ankle and foot: Secondary | ICD-10-CM | POA: Diagnosis not present

## 2016-12-13 DIAGNOSIS — Z8679 Personal history of other diseases of the circulatory system: Secondary | ICD-10-CM | POA: Diagnosis not present

## 2016-12-13 DIAGNOSIS — Z8709 Personal history of other diseases of the respiratory system: Secondary | ICD-10-CM | POA: Diagnosis not present

## 2016-12-13 DIAGNOSIS — Z79899 Other long term (current) drug therapy: Secondary | ICD-10-CM | POA: Diagnosis not present

## 2016-12-13 DIAGNOSIS — E559 Vitamin D deficiency, unspecified: Secondary | ICD-10-CM | POA: Diagnosis not present

## 2016-12-13 DIAGNOSIS — M0579 Rheumatoid arthritis with rheumatoid factor of multiple sites without organ or systems involvement: Secondary | ICD-10-CM | POA: Diagnosis not present

## 2016-12-13 NOTE — Progress Notes (Signed)
Rheumatology Medication Review by a Pharmacist Does the patient feel that his/her medications are working for him/her?  Patient reports some improvement on Orencia but continues to have wrist joint pain.   Has the patient been experiencing any side effects to the medications prescribed?  No Does the patient have any problems obtaining medications?  No  Issues to address at subsequent visits: None   Pharmacist comments:  Monica [name] is a pleasant 76 yo F who presents for follow up of rheumatoid arthritis.  She is currently receiving Orencia infusions 1000 mg every 4 weeks (started on 09/18/16) and hydroxychloroquine 200 mg in the morning and 100 mg in the evening.  Patient gets standing labs with infusions.  Most recent standing labs were on 12/11/16 which were normal.  TB Gold negative on 07/24/16.  She will be due for TB Gold again in March 2019.  Most recent hydroxychloroquine eye exam was on 08/29/16.  Patient denies any questions or concerns regarding her medications at this time.   Monica [name], Pharm.D., BCPS, CPP Clinical Pharmacist Pager: [phone] Phone: [phone] 12/13/2016 11:35 AM

## 2016-12-13 NOTE — Patient Instructions (Signed)
Natural anti-inflammatories  You can purchase these at [employer], [employer] or online.  . Turmeric (capsules)  . Ginger (ginger root or capsules)  . Omega 3 (Fish, flax seeds, chia seeds, walnuts, almonds)  . Tart cherry (dried or extract)   Patient should be under the care of a physician while taking these supplements. This may not be reproduced without the permission of Dr. Darlyne Schmiesing.  

## 2016-12-20 ENCOUNTER — Telehealth: Payer: Self-pay | Admitting: Radiology

## 2016-12-20 DIAGNOSIS — M8580 Other specified disorders of bone density and structure, unspecified site: Secondary | ICD-10-CM | POA: Insufficient documentation

## 2016-12-20 DIAGNOSIS — M85852 Other specified disorders of bone density and structure, left thigh: Secondary | ICD-10-CM

## 2016-12-20 NOTE — Telephone Encounter (Signed)
Called patient to tell her we did get the DEXA and it does show improvement.

## 2016-12-20 NOTE — Telephone Encounter (Signed)
Call pt about DEXA

## 2016-12-20 NOTE — Telephone Encounter (Signed)
DEXA shows Osteopenia improved bone density CA Vit D WB exercises recommended, will call patient, sent DEXA for scanning

## 2017-01-05 ENCOUNTER — Telehealth: Payer: Self-pay | Admitting: Rheumatology

## 2017-01-05 ENCOUNTER — Other Ambulatory Visit: Payer: Self-pay | Admitting: Radiology

## 2017-01-05 DIAGNOSIS — M0579 Rheumatoid arthritis with rheumatoid factor of multiple sites without organ or systems involvement: Secondary | ICD-10-CM

## 2017-01-05 NOTE — Telephone Encounter (Signed)
[name] requesting orders for Orencia be put in for patients appt on Tuesday 9/4, Am.

## 2017-01-05 NOTE — Progress Notes (Signed)
Infusion orders updated today including CBC CMP Tylenol and Benadryl appointments are up to date and a follow up appointment is scheduled/ TB gold is due on 07/2017 

## 2017-01-09 ENCOUNTER — Encounter (HOSPITAL_COMMUNITY)
Admission: RE | Admit: 2017-01-09 | Discharge: 2017-01-09 | Disposition: A | Discharge status: Home / Self Care | Payer: Medicare Other | Source: Ambulatory Visit | Attending: Rheumatology | Admitting: Rheumatology

## 2017-01-09 DIAGNOSIS — M0579 Rheumatoid arthritis with rheumatoid factor of multiple sites without organ or systems involvement: Secondary | ICD-10-CM | POA: Diagnosis not present

## 2017-01-09 MED ORDER — DIPHENHYDRAMINE HCL 25 MG PO CAPS: 25.0000 mg | ORAL_CAPSULE | ORAL | Status: DC

## 2017-01-09 MED ORDER — SODIUM CHLORIDE 0.9 % IV SOLN
1000.0000 mg | INTRAVENOUS | Status: DC
  Administered 2017-01-09: 1000 mg via INTRAVENOUS
  Filled 2017-01-09: qty 40

## 2017-01-09 MED ORDER — SODIUM CHLORIDE 0.9 % IV SOLN
INTRAVENOUS | Status: DC
  Administered 2017-01-09: 250 mL via INTRAVENOUS

## 2017-01-09 MED ORDER — ACETAMINOPHEN 325 MG PO TABS: 650.0000 mg | ORAL_TABLET | ORAL | Status: DC

## 2017-01-09 NOTE — Progress Notes (Signed)
Pt states she took her tylenol and benadryl at home before coming to the hospital.

## 2017-01-10 ENCOUNTER — Other Ambulatory Visit: Payer: Self-pay | Admitting: Rheumatology

## 2017-01-10 NOTE — Telephone Encounter (Signed)
Last Visit: 12/13/16 Next Visit: 05/23/16 Labs: 12/11/16 WNL PLQ Eye Exam: 08/29/16 WNL  Okay to refill per Dr. Corliss Skains

## 2017-01-29 ENCOUNTER — Other Ambulatory Visit: Payer: Self-pay | Admitting: Rheumatology

## 2017-01-29 DIAGNOSIS — M0579 Rheumatoid arthritis with rheumatoid factor of multiple sites without organ or systems involvement: Secondary | ICD-10-CM

## 2017-01-29 NOTE — Progress Notes (Signed)
Infusion orders updated today including CBC CMP Tylenol and Benadryl appointments are up to date and a follow up appointment is scheduled/ TB gold is due on March 2019 / next 3 infusions ordered

## 2017-02-06 ENCOUNTER — Encounter (HOSPITAL_COMMUNITY)
Admission: RE | Admit: 2017-02-06 | Discharge: 2017-02-06 | Disposition: A | Discharge status: Home / Self Care | Payer: Medicare Other | Source: Ambulatory Visit | Attending: Rheumatology | Admitting: Rheumatology

## 2017-02-06 DIAGNOSIS — M0579 Rheumatoid arthritis with rheumatoid factor of multiple sites without organ or systems involvement: Secondary | ICD-10-CM | POA: Diagnosis not present

## 2017-02-06 LAB — COMPREHENSIVE METABOLIC PANEL
ALBUMIN: 3.9 g/dL (ref 3.5–5.0)
ALK PHOS: 68 U/L (ref 38–126)
ALT: 12 U/L — AB (ref 14–54)
AST: 16 U/L (ref 15–41)
Anion gap: 10 (ref 5–15)
BUN: 16 mg/dL (ref 6–20)
CALCIUM: 8.9 mg/dL (ref 8.9–10.3)
CO2: 26 mmol/L (ref 22–32)
CREATININE: 0.73 mg/dL (ref 0.44–1.00)
Chloride: 102 mmol/L (ref 101–111)
GFR calc Af Amer: >60 mL/min (ref >60)
Glucose, Bld: 81 mg/dL (ref 65–99)
Potassium: 3.6 mmol/L (ref 3.5–5.1)
SODIUM: 138 mmol/L (ref 135–145)
Total Bilirubin: 0.7 mg/dL (ref 0.3–1.2)
Total Protein: 7.3 g/dL (ref 6.5–8.1)

## 2017-02-06 LAB — CBC
HEMATOCRIT: 38.9 % (ref 36.0–46.0)
Hemoglobin: 12.9 g/dL (ref 12.0–15.0)
MCH: 32 pg (ref 26.0–34.0)
MCHC: 33.2 g/dL (ref 30.0–36.0)
MCV: 96.5 fL (ref 78.0–100.0)
PLATELETS: 321 10*3/uL (ref 150–400)
RBC: 4.03 MIL/uL (ref 3.87–5.11)
RDW: 12.7 % (ref 11.5–15.5)
WBC: 6.9 10*3/uL (ref 4.0–10.5)

## 2017-02-06 MED ORDER — SODIUM CHLORIDE 0.9 % IV SOLN
INTRAVENOUS | Status: DC
  Administered 2017-02-06: 10:00:00 via INTRAVENOUS

## 2017-02-06 MED ORDER — SODIUM CHLORIDE 0.9 % IV SOLN
1000.0000 mg | Freq: Once | INTRAVENOUS | Status: AC
  Administered 2017-02-06: 1000 mg via INTRAVENOUS
  Filled 2017-02-06: qty 40

## 2017-02-06 NOTE — Progress Notes (Signed)
Results for Monica [name], Monica [name] (MRN [id number]) as of 02/06/2017 15:15  Ref. Range 02/06/2017 10:50  COMPREHENSIVE METABOLIC PANEL Unknown Rpt (A)  Sodium Latest Ref Range: 135 - 145 mmol/L 138  Potassium Latest Ref Range: 3.5 - 5.1 mmol/L 3.6  Chloride Latest Ref Range: 101 - 111 mmol/L 102  CO2 Latest Ref Range: 22 - 32 mmol/L 26  Glucose Latest Ref Range: 65 - 99 mg/dL 81  BUN Latest Ref Range: 6 - 20 mg/dL 16  Creatinine Latest Ref Range: 0.44 - 1.00 mg/dL 1.24  Calcium Latest Ref Range: 8.9 - 10.3 mg/dL 8.9  Anion gap Latest Ref Range: 5 - 15  10  Alkaline Phosphatase Latest Ref Range: 38 - 126 U/L 68  Albumin Latest Ref Range: 3.5 - 5.0 g/dL 3.9  AST Latest Ref Range: 15 - 41 U/L 16  ALT Latest Ref Range: 14 - 54 U/L 12 (L)  Total Protein Latest Ref Range: 6.5 - 8.1 g/dL 7.3  Total Bilirubin Latest Ref Range: 0.3 - 1.2 mg/dL 0.7  GFR, Est Non African American Latest Ref Range: >60 mL/min >60  GFR, Est African American Latest Ref Range: >60 mL/min >60  WBC Latest Ref Range: 4.0 - 10.5 K/uL 6.9  RBC Latest Ref Range: 3.87 - 5.11 MIL/uL 4.03  Hemoglobin Latest Ref Range: 12.0 - 15.0 g/dL 58.0  HCT Latest Ref Range: 36.0 - 46.0 % 38.9  MCV Latest Ref Range: 78.0 - 100.0 fL 96.5  MCH Latest Ref Range: 26.0 - 34.0 pg 32.0  MCHC Latest Ref Range: 30.0 - 36.0 g/dL 99.8  RDW Latest Ref Range: 11.5 - 15.5 % 12.7  Platelets Latest Ref Range: 150 - 400 K/uL 321

## 2017-02-06 NOTE — Progress Notes (Signed)
Pt states she took her benadryl and tylenol at home before coming to hospital. 

## 2017-02-06 NOTE — Progress Notes (Signed)
WNLs

## 2017-02-12 ENCOUNTER — Encounter: Payer: Self-pay | Admitting: Internal Medicine

## 2017-02-12 ENCOUNTER — Ambulatory Visit (INDEPENDENT_AMBULATORY_CARE_PROVIDER_SITE_OTHER): Payer: Medicare Other | Admitting: Internal Medicine

## 2017-02-12 VITALS — BP 132/80 | HR 79 | Ht 63.0 in | Wt 238.6 lb

## 2017-02-12 DIAGNOSIS — J4530 Mild persistent asthma, uncomplicated: Secondary | ICD-10-CM

## 2017-02-12 DIAGNOSIS — Z23 Encounter for immunization: Secondary | ICD-10-CM | POA: Diagnosis not present

## 2017-02-12 DIAGNOSIS — J4531 Mild persistent asthma with (acute) exacerbation: Secondary | ICD-10-CM

## 2017-02-12 DIAGNOSIS — M0579 Rheumatoid arthritis with rheumatoid factor of multiple sites without organ or systems involvement: Secondary | ICD-10-CM | POA: Diagnosis not present

## 2017-02-12 LAB — NITRIC OXIDE: NITRIC OXIDE: 32

## 2017-02-12 MED ORDER — FLUTICASONE-UMECLIDIN-VILANT 100-62.5-25 MCG/INH IN AEPB
1.0000 | INHALATION_SPRAY | Freq: Every day | RESPIRATORY_TRACT | 11 refills | Status: DC
Start: 2017-02-12 — End: 2018-02-12

## 2017-02-12 MED ORDER — FLUTICASONE-UMECLIDIN-VILANT 100-62.5-25 MCG/INH IN AEPB: 1.0000 | INHALATION_SPRAY | Freq: Every day | RESPIRATORY_TRACT | 0 refills | Status: DC

## 2017-02-12 NOTE — Assessment & Plan Note (Signed)
Reason cough peaked in September but had persisted through the summer. We are going to try another sample of Trelegy which seem to make a real difference. Medications discussed. We might be able to use a combination of her Stiolto with Virgel Bouquet if insurance won't cover Trelegy. She does not recognize significant postnasal drainage or reflux.

## 2017-02-12 NOTE — Assessment & Plan Note (Signed)
She continues Plaquenil, supervised by her rheumatologist.

## 2017-02-12 NOTE — Patient Instructions (Addendum)
Flu vax  Senior  Order- FENO     Dx asthma mild persistent  Sample and printed script for Trelegy     Inhale 1 puff, then rinse mouth, once daily. You can see if your insurance now covers this.  If we can't get Trelegy, then let me know so we can try Breo again, with Stiolto  Please call if we can help

## 2017-02-12 NOTE — Progress Notes (Signed)
HPI female never smoker followed for asthma, allergic rhinitis, complicated by rheumatoid arthritis PFT 02/02/09-mild obstruction small airways, minimal response bronchodilator, DLCO mildly reduced. FVC 3.36/116%, FEV1 2.48/119%, ratio 0.74, FEF 25-75% 1.80/75%, TLC 108%, DLCO 70% Office Spirometry 09/08/16-WNL-FVC 2.63/98%, FEV1 1.91/95%, ratio 0.73, FEF 25-75% 1.32/82% FENO 02/12/17- 32H ------------------------------------------------------------------------------------- 10/09/16- 76 year old female never smoker followed for asthma, chronic cough, allergic rhinitis, complicated by rheumatoid arthritis FOLLOWS FOR:Pt states she is doing much better and likes Trelegy much better. She rapidly got better within just a few days after starting Trelegy sample. When she finally ran out of Trelegy she went back to [facility], but has noted some wheezing at night. She very much wants to be back on Trelegy. CXR 09/08/16- Stable exam.  No active cardiopulmonary disease  02/12/17- 76 year old female never smoker followed for asthma, chronic cough, allergic rhinitis, complicated by rheumatoid arthritis Follows for: asthma.  pt states she has had lots of coughing, wheezing, SOB throughout the summer, notes improvement over the past 5 days.  pt has been using rescue inhaler daily.  Now using Stiolto Was better with Trelegy. Virgel Bouquet may be worth another try, with Stiolto. She denies reflux. FENO 02/12/17- 32H  ROS-see HPI + = positive Constitutional:   No-   weight loss, night sweats, fevers, chills, fatigue, lassitude. HEENT:   No-  headaches, difficulty swallowing, tooth/dental problems, sore throat,       No-  sneezing, itching, ear ache, nasal congestion, post nasal drip,  CV:  No-   chest pain, orthopnea, PND, +swelling in lower extremities, anasarca, dizziness, palpitations Resp:   + shortness of breath with exertion or at rest.              No-   productive cough,  + non-productive cough,  No- coughing up of blood.               No-   change in color of mucus.  +rare wheezing.   Skin: No-   rash or lesions. GI:  No-   heartburn, indigestion, abdominal pain, nausea, vomiting,  GU:  MS: + joint pain or swelling.   Neuro-     nothing unusual Psych:  No- change in mood or affect. No depression or anxiety.  No memory loss.  OBJ        General- Alert, Oriented, Affect-appropriate, Distress- none acute. + Morbidly obese Skin- rash-none, lesions- none, excoriation- none Lymphadenopathy- none Head- atraumatic            Eyes- Gross vision intact, PERRLA, conjunctivae clear secretions            Ears- Hearing, canals-normal            Nose- Clear, no-Septal dev, mucus, polyps, erosion, perforation             Throat- Mallampati III , mucosa clear , drainage- none, tonsils- atrophic. +Easy gag. Neck- flexible , trachea midline, no stridor , thyroid nl, carotid no bruit Chest - symmetrical excursion , unlabored           Heart/CV- RRR , no murmur , no gallop  , no rub, nl s1 s2                           - JVD- none , edema- none, stasis changes- none, varices- none           Lung- clear, wheeze- none, cough- none , dullness-none, rub- none  Chest wall-  Abd-  Br/ Gen/ Rectal- Not done, not indicated Extrem- cyanosis- none, clubbing, none, atrophy- none, strength- nl Neuro- grossly intact to observation

## 2017-03-09 ENCOUNTER — Telehealth: Payer: Self-pay

## 2017-03-09 NOTE — Telephone Encounter (Signed)
Christina with [name] needs orders to be put into Epic for appt. Patient has infusion scheduled for Monday 03/12/17 @ 9am.   Cb# is [phone].  Thank You.

## 2017-03-12 ENCOUNTER — Encounter (HOSPITAL_COMMUNITY)
Admission: RE | Admit: 2017-03-12 | Discharge: 2017-03-12 | Disposition: A | Discharge status: Home / Self Care | Payer: Medicare Other | Source: Ambulatory Visit | Attending: Rheumatology | Admitting: Rheumatology

## 2017-03-12 ENCOUNTER — Other Ambulatory Visit: Payer: Self-pay | Admitting: *Deleted

## 2017-03-12 DIAGNOSIS — M0579 Rheumatoid arthritis with rheumatoid factor of multiple sites without organ or systems involvement: Secondary | ICD-10-CM | POA: Insufficient documentation

## 2017-03-12 MED ORDER — ABATACEPT 250 MG IV SOLR
1000.0000 mg | Freq: Once | INTRAVENOUS | Status: AC
  Administered 2017-03-12: 1000 mg via INTRAVENOUS
  Filled 2017-03-12: qty 40

## 2017-03-12 MED ORDER — DIPHENHYDRAMINE HCL 25 MG PO CAPS: 25.0000 mg | ORAL_CAPSULE | ORAL | Status: DC

## 2017-03-12 MED ORDER — SODIUM CHLORIDE 0.9 % IV SOLN
INTRAVENOUS | Status: DC
  Administered 2017-03-12: 250 mL via INTRAVENOUS

## 2017-03-12 NOTE — Telephone Encounter (Signed)
Orders have been placed for infusion.

## 2017-03-12 NOTE — Progress Notes (Signed)
Pt states she has taken her benadryl at home before coming to hospital.

## 2017-03-19 ENCOUNTER — Other Ambulatory Visit: Payer: Self-pay | Admitting: Rheumatology

## 2017-03-19 NOTE — Telephone Encounter (Signed)
Last Visit: 12/13/16 Next Visit: 05/23/16  Okay to refill per Dr. Corliss Skains

## 2017-04-06 ENCOUNTER — Telehealth (INDEPENDENT_AMBULATORY_CARE_PROVIDER_SITE_OTHER): Payer: Self-pay

## 2017-04-06 NOTE — Telephone Encounter (Signed)
Becky with [facility] called stating that orders are needed for patient's appointment Monday for Orencia.  Please advise.  Thank you.

## 2017-04-09 ENCOUNTER — Other Ambulatory Visit: Payer: Self-pay | Admitting: *Deleted

## 2017-04-09 ENCOUNTER — Encounter (HOSPITAL_COMMUNITY): Admission: RE | Admit: 2017-04-09 | Payer: Medicare Other | Source: Ambulatory Visit

## 2017-04-09 DIAGNOSIS — M0579 Rheumatoid arthritis with rheumatoid factor of multiple sites without organ or systems involvement: Secondary | ICD-10-CM

## 2017-04-09 NOTE — Telephone Encounter (Signed)
Infusion Orders have been placed.

## 2017-04-09 NOTE — Progress Notes (Signed)
Infusion orders are current for patient CBC CMP Tylenol Benadryl appointments are up to date and follow up appointment  is scheduled TB gold not due yet.  

## 2017-04-10 ENCOUNTER — Other Ambulatory Visit: Payer: Self-pay | Admitting: Rheumatology

## 2017-04-10 NOTE — Telephone Encounter (Signed)
Last Visit: 12/13/16 Next Visit: 05/23/16 Labs: 02/06/17 WNL PLQ Eye exam: 08/29/16 WNL  Okay to refill per Dr. Corliss Skains

## 2017-04-12 DIAGNOSIS — F419 Anxiety disorder, unspecified: Secondary | ICD-10-CM | POA: Diagnosis not present

## 2017-04-12 DIAGNOSIS — M069 Rheumatoid arthritis, unspecified: Secondary | ICD-10-CM | POA: Diagnosis not present

## 2017-04-12 DIAGNOSIS — I10 Essential (primary) hypertension: Secondary | ICD-10-CM | POA: Diagnosis not present

## 2017-04-12 DIAGNOSIS — J45909 Unspecified asthma, uncomplicated: Secondary | ICD-10-CM | POA: Diagnosis not present

## 2017-04-12 DIAGNOSIS — R5383 Other fatigue: Secondary | ICD-10-CM | POA: Diagnosis not present

## 2017-04-12 DIAGNOSIS — E7800 Pure hypercholesterolemia, unspecified: Secondary | ICD-10-CM | POA: Diagnosis not present

## 2017-04-12 DIAGNOSIS — R7301 Impaired fasting glucose: Secondary | ICD-10-CM | POA: Diagnosis not present

## 2017-04-12 DIAGNOSIS — F339 Major depressive disorder, recurrent, unspecified: Secondary | ICD-10-CM | POA: Diagnosis not present

## 2017-04-12 DIAGNOSIS — Z6841 Body Mass Index (BMI) 40.0 and over, adult: Secondary | ICD-10-CM | POA: Diagnosis not present

## 2017-04-16 ENCOUNTER — Encounter (HOSPITAL_COMMUNITY): Admission: RE | Admit: 2017-04-16 | Payer: Medicare Other | Source: Ambulatory Visit

## 2017-04-18 ENCOUNTER — Encounter (HOSPITAL_COMMUNITY)
Admission: RE | Admit: 2017-04-18 | Discharge: 2017-04-18 | Disposition: A | Discharge status: Home / Self Care | Payer: Medicare Other | Source: Ambulatory Visit | Attending: Rheumatology | Admitting: Rheumatology

## 2017-04-18 DIAGNOSIS — M0579 Rheumatoid arthritis with rheumatoid factor of multiple sites without organ or systems involvement: Secondary | ICD-10-CM | POA: Diagnosis not present

## 2017-04-18 LAB — CBC
HCT: 40.1 % (ref 36.0–46.0)
Hemoglobin: 13.1 g/dL (ref 12.0–15.0)
MCH: 31.9 pg (ref 26.0–34.0)
MCHC: 32.7 g/dL (ref 30.0–36.0)
MCV: 97.6 fL (ref 78.0–100.0)
Platelets: 327 10*3/uL (ref 150–400)
RBC: 4.11 MIL/uL (ref 3.87–5.11)
RDW: 12.9 % (ref 11.5–15.5)
WBC: 7.5 10*3/uL (ref 4.0–10.5)

## 2017-04-18 LAB — COMPREHENSIVE METABOLIC PANEL
ALT: 10 U/L — ABNORMAL LOW (ref 14–54)
ANION GAP: 9 (ref 5–15)
AST: 19 U/L (ref 15–41)
Albumin: 3.8 g/dL (ref 3.5–5.0)
Alkaline Phosphatase: 65 U/L (ref 38–126)
BUN: 16 mg/dL (ref 6–20)
CHLORIDE: 101 mmol/L (ref 101–111)
CO2: 26 mmol/L (ref 22–32)
Calcium: 9.3 mg/dL (ref 8.9–10.3)
Creatinine, Ser: 0.67 mg/dL (ref 0.44–1.00)
GFR calc Af Amer: >60 mL/min (ref >60)
Glucose, Bld: 93 mg/dL (ref 65–99)
POTASSIUM: 3.5 mmol/L (ref 3.5–5.1)
Sodium: 136 mmol/L (ref 135–145)
Total Bilirubin: 0.7 mg/dL (ref 0.3–1.2)
Total Protein: 7.1 g/dL (ref 6.5–8.1)

## 2017-04-18 MED ORDER — ACETAMINOPHEN 325 MG PO TABS: 650.0000 mg | ORAL_TABLET | ORAL | Status: DC

## 2017-04-18 MED ORDER — SODIUM CHLORIDE 0.9 % IV SOLN
1000.0000 mg | INTRAVENOUS | Status: DC
  Administered 2017-04-18: 1000 mg via INTRAVENOUS
  Filled 2017-04-18: qty 40

## 2017-04-18 MED ORDER — DIPHENHYDRAMINE HCL 25 MG PO CAPS: 25.0000 mg | ORAL_CAPSULE | ORAL | Status: DC

## 2017-04-18 NOTE — Progress Notes (Signed)
Results for Monica [name], Monica [name] (MRN [id number]) as of 04/18/2017 12:39  Ref. Range 04/18/2017 11:27  COMPREHENSIVE METABOLIC PANEL Unknown Rpt (A)  Sodium Latest Ref Range: 135 - 145 mmol/L 136  Potassium Latest Ref Range: 3.5 - 5.1 mmol/L 3.5  Chloride Latest Ref Range: 101 - 111 mmol/L 101  CO2 Latest Ref Range: 22 - 32 mmol/L 26  Glucose Latest Ref Range: 65 - 99 mg/dL 93  BUN Latest Ref Range: 6 - 20 mg/dL 16  Creatinine Latest Ref Range: 0.44 - 1.00 mg/dL 3.23  Calcium Latest Ref Range: 8.9 - 10.3 mg/dL 9.3  Anion gap Latest Ref Range: 5 - 15  9  Alkaline Phosphatase Latest Ref Range: 38 - 126 U/L 65  Albumin Latest Ref Range: 3.5 - 5.0 g/dL 3.8  AST Latest Ref Range: 15 - 41 U/L 19  ALT Latest Ref Range: 14 - 54 U/L 10 (L)  Total Protein Latest Ref Range: 6.5 - 8.1 g/dL 7.1  Total Bilirubin Latest Ref Range: 0.3 - 1.2 mg/dL 0.7  GFR, Est Non African American Latest Ref Range: >60 mL/min >60  GFR, Est African American Latest Ref Range: >60 mL/min >60  WBC Latest Ref Range: 4.0 - 10.5 K/uL 7.5  RBC Latest Ref Range: 3.87 - 5.11 MIL/uL 4.11  Hemoglobin Latest Ref Range: 12.0 - 15.0 g/dL 55.7  HCT Latest Ref Range: 36.0 - 46.0 % 40.1  MCV Latest Ref Range: 78.0 - 100.0 fL 97.6  MCH Latest Ref Range: 26.0 - 34.0 pg 31.9  MCHC Latest Ref Range: 30.0 - 36.0 g/dL 32.2  RDW Latest Ref Range: 11.5 - 15.5 % 12.9  Platelets Latest Ref Range: 150 - 400 K/uL 327

## 2017-05-09 NOTE — Progress Notes (Signed)
Office Visit Note  Patient: Monica [name]             Date of Birth: [birth year]           MRN: [id number]             PCP: Juluis Rainier, MD Referring: Juluis Rainier, MD Visit Date: 05/23/2017 Occupation: @GUAROCC @    Subjective:  Bilateral hand pain and left wrist pain    History of Present Illness: Monica [name] is a 77 y.o. female with a history of seropositive rheumatoid arthritis and osteoarthritis.  She states since mid-December she has been having increased bilateral hand pain and left wrist pain.  She reports minimal swelling in her hands and left wrist.  She has been having increased joint stiffness.  She continues to have have Orencia infusions and Plaquenil 200 mg in the morning and 100 mg at night.  She takes Ibuprofen and Tylenol every morning, which helps with her stiffness and pain.  Her PCP still manages her osteopenia.  Her most recent bone density was in July, 12 2018.   She continues to be in the osteopenic range but her BMD improved.  She continues to take Vitamin D and Calcium.    Activities of Daily Living:  Patient reports morning stiffness for 3-4 hours.   Patient Denies nocturnal pain.  Difficulty dressing/grooming: Denies Difficulty climbing stairs: Reports Difficulty getting out of chair: Reports Difficulty using hands for taps, buttons, cutlery, and/or writing: Denies   Review of Systems  Constitutional: Positive for fatigue. Negative for weakness.  HENT: Positive for mouth dryness. Negative for mouth sores and nose dryness.   Eyes: Positive for dryness. Negative for redness.  Respiratory: Negative for cough, hemoptysis, shortness of breath and difficulty breathing.   Cardiovascular: Negative for chest pain, palpitations, hypertension, irregular heartbeat and swelling in legs/feet.  Gastrointestinal: Negative for blood in stool, constipation and diarrhea.  Endocrine: Negative for increased urination.  Genitourinary: Negative for painful  urination.  Musculoskeletal: Positive for arthralgias, joint pain, joint swelling and morning stiffness. Negative for myalgias, muscle weakness, muscle tenderness and myalgias.  Skin: Positive for rash (Eczmea). Negative for pallor, hair loss, nodules/bumps, redness, skin tightness, ulcers and sensitivity to sunlight.  Neurological: Negative for dizziness, numbness and headaches.  Hematological: Negative for swollen glands.  Psychiatric/Behavioral: Positive for sleep disturbance. Negative for depressed mood (On Zoloft). The patient is not nervous/anxious.     PMFS History:  Patient Active Problem List   Diagnosis Date Noted  . Osteopenia 12/20/2016  . History of depression 12/11/2016  . Asthma, mild persistent 09/08/2016  . High risk medication use 05/15/2016  . Primary osteoarthritis of both hands 05/15/2016  . Primary osteoarthritis of both feet 05/15/2016  . Primary osteoarthritis of both knees 05/15/2016  . Vitamin D deficiency 05/15/2016  . History of hypertension 05/15/2016  . History of hyperlipidemia 05/15/2016  . Rheumatoid arthritis with rheumatoid factor of multiple sites without organ or systems involvement (HCC) 01/10/2016  . Seasonal allergic rhinitis 10/03/2011  . HYPERLIPIDEMIA 12/31/2008  . HYPERTENSION 12/31/2008  . Mild persistent asthmatic bronchitis with exacerbation 12/31/2008    Past Medical History:  Diagnosis Date  . Arthritis   . 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 Hyperlipidemia   . Hypertension     Family History  Problem Relation Age of Onset  . Lung cancer Father   . Alzheimer's disease Mother    Past Surgical History:  Procedure Laterality Date  .  CARPAL TUNNEL RELEASE    . FOOT SURGERY     Social History   Social History Narrative  . Not on file     Objective: Vital Signs: BP 126/70 (BP Location: Left Arm, Patient Position: Sitting, Cuff Size: Normal)   Pulse 81   Resp 18   Ht 5\' 3"  (1.6 m)   Wt 245 lb  (111.1 kg)   BMI 43.40 kg/m    Physical Exam  Constitutional: She is oriented to person, place, and time. She appears well-developed and well-nourished.  HENT:  Head: Normocephalic and atraumatic.  Eyes: Conjunctivae and EOM are normal.  Neck: Normal range of motion.  Cardiovascular: Normal rate, regular rhythm, normal heart sounds and intact distal pulses.  Pulmonary/Chest: Effort normal and breath sounds normal.  Abdominal: Soft. Bowel sounds are normal.  Lymphadenopathy:    She has no cervical adenopathy.  Neurological: She is alert and oriented to person, place, and time.  Skin: Skin is warm and dry. Capillary refill takes less than 2 seconds.  Eczema on palmar aspect of bilateral hands.  Psychiatric: She has a normal mood and affect. Her behavior is normal.  Nursing note and vitals reviewed.    Musculoskeletal Exam: C-spine, thoracic, and lumbar spine good ROM.  Discomfort in lumbar spine with ROM.  No midline spinal tenderness or SI joint tenderness. Shoulder joints, elbow joints good ROM.  Wrist joints limited ROM.  Extensor tenosynovitis of right hand.  Left wrist synovitis.  MCPs, PIPs, and DIPs good ROM. 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 Hip joints, knee joints, ankle joints, MTPs, PIPs, and DIPs good ROM with no synovitis.  Knee crepitus bilaterally.  No warmth or effusion.    CDAI Exam: CDAI Homunculus Exam:   Tenderness:  LUE: wrist  Swelling:  LUE: wrist  Joint Counts:  CDAI Tender Joint count: 1 CDAI Swollen Joint count: 1     Investigation: No additional findings.TB Gold: 07/24/2016 Negative  CBC Latest Ref Rng & Units 04/18/2017 02/06/2017 12/11/2016  WBC 4.0 - 10.5 K/uL 7.5 6.9 7.9  Hemoglobin 12.0 - 15.0 g/dL 53.6 64.4 03.4  Hematocrit 36.0 - 46.0 % 40.1 38.9 38.8  Platelets 150 - 400 K/uL 327 321 306   CMP Latest Ref Rng & Units 04/18/2017 02/06/2017 12/11/2016  Glucose 65 - 99 mg/dL 93 81 93  BUN 6 - 20 mg/dL 16 16 15     Creatinine 0.44 - 1.00 mg/dL 7.42 5.95 6.38  Sodium 135 - 145 mmol/L 136 138 137  Potassium 3.5 - 5.1 mmol/L 3.5 3.6 3.8  Chloride 101 - 111 mmol/L 101 102 102  CO2 22 - 32 mmol/L 26 26 27   Calcium 8.9 - 10.3 mg/dL 9.3 8.9 9.2  Total Protein 6.5 - 8.1 g/dL 7.1 7.3 7.0  Total Bilirubin 0.3 - 1.2 mg/dL 0.7 0.7 0.7  Alkaline Phos 38 - 126 U/L 65 68 61  AST 15 - 41 U/L 19 16 20   ALT 14 - 54 U/L 10(L) 12(L) 12(L)    Imaging: No results found.  Speciality Comments: Orencia 1000 mg / started on 09/2016 has had inadequate response to Simponi Aria infusions Tb gold negative on 07/24/16       Procedures:  No procedures performed Allergies: Arava [leflunomide] and Piroxicam   Assessment / Plan:     Visit Diagnoses: Rheumatoid arthritis with rheumatoid factor of multiple sites without organ or systems involvement (HCC) - +CCP: Synovitis of left wrist. She has Extensor tenosynovitis of right hand. She will  continue on Orencia IV and Plaquenil 300 mg daily.  She declined a Prednisone taper at this time.  She was given a prescription for Voltaren gel.    High risk medication use - Orencia IV(09/2016) and Plaquenil 300 mg po qd ( has had inadequate response to Simponi Aria, Reaction to Arava, Methotrexate caused oral ulcers ). She has routine labs performed with her infusions.  She will be due for a TB gold lab test in March 2019.  Future order was placed.   - Plan: QuantiFERON-TB Gold Plus  Primary osteoarthritis of both hands: PIP and DIP synovial thickening.  Joint protection and muscle strengthening discussed.   Primary osteoarthritis of both knees: Bilateral knee crepitus.  No warmth or effusion.  She has been having increased knee joint stiffness.   Primary osteoarthritis of both feet: No discomfort in feet at this time.  No synovitis on exam.  Proper fitting shoes discussed.   Osteopenia of multiple sites - DXA 11/2014 T-2.1 done by PCP.  Her most recent DEXA was on November 16, 2016, which  revealed she is still within the osteopenic range but her BMD improved.  She will continue on Calcium and Vitamin D supplements.    Other medical conditions are listed as follows:  History of hyperlipidemia  History of depression  History of hypertension  History of vitamin D deficiency - On supplement  History of asthma - and seasonal allergies     Orders: Orders Placed This Encounter  Procedures  . QuantiFERON-TB Gold Plus   Meds ordered this encounter  Medications  . diclofenac sodium (VOLTAREN) 1 % GEL    Sig: Apply 3 grams to three large joints up to three times daily as needed    Dispense:  3 Tube    Refill:  3    Face-to-face time spent with patient was 30 minutes. Greater than 50% of time was spent in counseling and coordination of care.  Follow-Up Instructions: Return in about 5 months (around 10/21/2017) for Rheumatoid arthritis.   Pollyann Savoy, MD  Note - This record has been created using Animal nutritionist.  Chart creation errors have been sought, but may not always  have been located. Such creation errors do not reflect on  the standard of medical care.

## 2017-05-16 ENCOUNTER — Encounter (HOSPITAL_COMMUNITY)
Admission: RE | Admit: 2017-05-16 | Discharge: 2017-05-16 | Disposition: A | Discharge status: Home / Self Care | Payer: Medicare Other | Source: Ambulatory Visit | Attending: Rheumatology | Admitting: Rheumatology

## 2017-05-16 ENCOUNTER — Other Ambulatory Visit: Payer: Self-pay | Admitting: *Deleted

## 2017-05-16 ENCOUNTER — Telehealth: Payer: Self-pay | Admitting: Rheumatology

## 2017-05-16 DIAGNOSIS — M0579 Rheumatoid arthritis with rheumatoid factor of multiple sites without organ or systems involvement: Secondary | ICD-10-CM | POA: Diagnosis not present

## 2017-05-16 MED ORDER — SODIUM CHLORIDE 0.9 % IV SOLN
INTRAVENOUS | Status: DC
  Administered 2017-05-16: 250 mL via INTRAVENOUS

## 2017-05-16 MED ORDER — SODIUM CHLORIDE 0.9 % IV SOLN
1000.0000 mg | Freq: Once | INTRAVENOUS | Status: AC
  Administered 2017-05-16: 1000 mg via INTRAVENOUS
  Filled 2017-05-16: qty 40

## 2017-05-16 NOTE — Telephone Encounter (Signed)
[facility] called stating that they have not received the orders for patient to receive her Orencia and she has an appointment today at 10:00 am.  Please fax orders to # [phone]

## 2017-05-16 NOTE — Telephone Encounter (Signed)
Orders placed.

## 2017-05-23 ENCOUNTER — Encounter: Payer: Self-pay | Admitting: Rheumatology

## 2017-05-23 ENCOUNTER — Ambulatory Visit (INDEPENDENT_AMBULATORY_CARE_PROVIDER_SITE_OTHER): Payer: Medicare Other | Admitting: Rheumatology

## 2017-05-23 VITALS — BP 126/70 | HR 81 | Resp 18 | Ht 63.0 in | Wt 245.0 lb

## 2017-05-23 DIAGNOSIS — Z8659 Personal history of other mental and behavioral disorders: Secondary | ICD-10-CM | POA: Diagnosis not present

## 2017-05-23 DIAGNOSIS — M170 Bilateral primary osteoarthritis of knee: Secondary | ICD-10-CM | POA: Diagnosis not present

## 2017-05-23 DIAGNOSIS — M19072 Primary osteoarthritis, left ankle and foot: Secondary | ICD-10-CM

## 2017-05-23 DIAGNOSIS — M0579 Rheumatoid arthritis with rheumatoid factor of multiple sites without organ or systems involvement: Secondary | ICD-10-CM | POA: Diagnosis not present

## 2017-05-23 DIAGNOSIS — Z79899 Other long term (current) drug therapy: Secondary | ICD-10-CM

## 2017-05-23 DIAGNOSIS — Z8679 Personal history of other diseases of the circulatory system: Secondary | ICD-10-CM

## 2017-05-23 DIAGNOSIS — Z8709 Personal history of other diseases of the respiratory system: Secondary | ICD-10-CM

## 2017-05-23 DIAGNOSIS — M19071 Primary osteoarthritis, right ankle and foot: Secondary | ICD-10-CM

## 2017-05-23 DIAGNOSIS — M8589 Other specified disorders of bone density and structure, multiple sites: Secondary | ICD-10-CM | POA: Diagnosis not present

## 2017-05-23 DIAGNOSIS — Z8639 Personal history of other endocrine, nutritional and metabolic disease: Secondary | ICD-10-CM | POA: Diagnosis not present

## 2017-05-23 DIAGNOSIS — M19042 Primary osteoarthritis, left hand: Secondary | ICD-10-CM

## 2017-05-23 DIAGNOSIS — M19041 Primary osteoarthritis, right hand: Secondary | ICD-10-CM | POA: Diagnosis not present

## 2017-05-23 MED ORDER — DICLOFENAC SODIUM 1 % TD GEL: TRANSDERMAL | 3 refills | Status: DC

## 2017-05-23 NOTE — Patient Instructions (Addendum)
Please have Tb Gold lab drawn with March 2019 infusion labs

## 2017-05-28 ENCOUNTER — Telehealth: Payer: Self-pay

## 2017-05-28 NOTE — Telephone Encounter (Signed)
Received a prior auth for Diclofenac Gel from [facility]. Auth was submitted to pts insurance via cover my meds. Will update one we receive a response.   Monica [name], [place], CPhT 10:01 AM

## 2017-05-29 ENCOUNTER — Telehealth: Payer: Self-pay | Admitting: Rheumatology

## 2017-05-29 NOTE — Telephone Encounter (Signed)
Rhea from [facility] left a voicemail requiring additional clinical history for the Voltaren.  Need to know if the patient has  asthma or allergic type reactions after taking aspirin.  CB# [phone]

## 2017-05-29 NOTE — Telephone Encounter (Signed)
Returned call. Spoke with [place]. Pt does have asthma. Authorization was updated. Will update once we have a response.   Anshika Pethtel, [place], CPhT 4:04 PM

## 2017-05-30 NOTE — Telephone Encounter (Signed)
Received a fax from [facility] regarding a prior authorization approval for VOLTAREN GEL from 05/30/17 to 05/28/2018.   Reference number:[id number] Phone [phone]  Will send document to scan center.  Called pt to update. Will send document to scan center.  Khrystyne Arpin, [place], CPhT 3:12 PM

## 2017-05-31 ENCOUNTER — Telehealth: Payer: Self-pay

## 2017-05-31 NOTE — Telephone Encounter (Signed)
Pt has Medicare and BCBS Medicare supplement. Neither plan will require a pre-cert. Called [employer] Medicare  Supplemental to verify coverage.Spoke with Kevin Fenton who states that the plan has been active since 05/09/2007. This plan will follow medicare guidelines. [phone]  Reference number: [id number] Phone: [phone]

## 2017-06-07 ENCOUNTER — Other Ambulatory Visit: Payer: Self-pay | Admitting: *Deleted

## 2017-06-07 DIAGNOSIS — M0579 Rheumatoid arthritis with rheumatoid factor of multiple sites without organ or systems involvement: Secondary | ICD-10-CM

## 2017-06-13 ENCOUNTER — Encounter (HOSPITAL_COMMUNITY): Payer: Self-pay

## 2017-06-13 ENCOUNTER — Encounter (HOSPITAL_COMMUNITY)
Admission: RE | Admit: 2017-06-13 | Discharge: 2017-06-13 | Disposition: A | Discharge status: Home / Self Care | Payer: Medicare Other | Source: Ambulatory Visit | Attending: Rheumatology | Admitting: Rheumatology

## 2017-06-13 DIAGNOSIS — M0579 Rheumatoid arthritis with rheumatoid factor of multiple sites without organ or systems involvement: Secondary | ICD-10-CM | POA: Diagnosis not present

## 2017-06-13 LAB — COMPREHENSIVE METABOLIC PANEL
ALK PHOS: 66 U/L (ref 38–126)
ALT: 13 U/L — AB (ref 14–54)
AST: 19 U/L (ref 15–41)
Albumin: 3.8 g/dL (ref 3.5–5.0)
Anion gap: 11 (ref 5–15)
BUN: 16 mg/dL (ref 6–20)
CO2: 27 mmol/L (ref 22–32)
CREATININE: 0.69 mg/dL (ref 0.44–1.00)
Calcium: 9.1 mg/dL (ref 8.9–10.3)
Chloride: 102 mmol/L (ref 101–111)
Glucose, Bld: 72 mg/dL (ref 65–99)
Potassium: 3.5 mmol/L (ref 3.5–5.1)
Sodium: 140 mmol/L (ref 135–145)
Total Bilirubin: 0.6 mg/dL (ref 0.3–1.2)
Total Protein: 7.4 g/dL (ref 6.5–8.1)

## 2017-06-13 LAB — CBC
HCT: 39.9 % (ref 36.0–46.0)
HEMOGLOBIN: 12.6 g/dL (ref 12.0–15.0)
MCH: 30.8 pg (ref 26.0–34.0)
MCHC: 31.6 g/dL (ref 30.0–36.0)
MCV: 97.6 fL (ref 78.0–100.0)
Platelets: 343 10*3/uL (ref 150–400)
RBC: 4.09 MIL/uL (ref 3.87–5.11)
RDW: 13.1 % (ref 11.5–15.5)
WBC: 7.1 10*3/uL (ref 4.0–10.5)

## 2017-06-13 MED ORDER — ACETAMINOPHEN 325 MG PO TABS: 650.0000 mg | ORAL_TABLET | ORAL | Status: DC

## 2017-06-13 MED ORDER — SODIUM CHLORIDE 0.9 % IV SOLN
1000.0000 mg | INTRAVENOUS | Status: DC
  Administered 2017-06-13: 1000 mg via INTRAVENOUS
  Filled 2017-06-13: qty 40

## 2017-06-13 MED ORDER — DIPHENHYDRAMINE HCL 25 MG PO CAPS: 25.0000 mg | ORAL_CAPSULE | ORAL | Status: DC

## 2017-06-13 MED ORDER — SODIUM CHLORIDE 0.9 % IV SOLN
Freq: Once | INTRAVENOUS | Status: AC
  Administered 2017-06-13: 10:00:00 via INTRAVENOUS

## 2017-06-13 NOTE — Progress Notes (Signed)
Patient tolerated infusion with no reaction.  States feeling as she did at baseline.  Prescription presented to [place] to draw a QuantiFERON- TB Gold, which requires a kit that we do not carry in our facility.  Suggested that patient take script to [facility], to be drawn.  Verbalized understanding.

## 2017-06-14 ENCOUNTER — Other Ambulatory Visit: Payer: Self-pay

## 2017-06-14 DIAGNOSIS — Z79899 Other long term (current) drug therapy: Secondary | ICD-10-CM

## 2017-06-14 DIAGNOSIS — L258 Unspecified contact dermatitis due to other agents: Secondary | ICD-10-CM | POA: Diagnosis not present

## 2017-06-14 DIAGNOSIS — L680 Hirsutism: Secondary | ICD-10-CM | POA: Diagnosis not present

## 2017-06-15 ENCOUNTER — Encounter: Payer: Self-pay | Admitting: Internal Medicine

## 2017-06-15 ENCOUNTER — Ambulatory Visit (INDEPENDENT_AMBULATORY_CARE_PROVIDER_SITE_OTHER): Payer: Medicare Other | Admitting: Internal Medicine

## 2017-06-15 DIAGNOSIS — J4530 Mild persistent asthma, uncomplicated: Secondary | ICD-10-CM

## 2017-06-15 DIAGNOSIS — Z79899 Other long term (current) drug therapy: Secondary | ICD-10-CM | POA: Diagnosis not present

## 2017-06-15 NOTE — Patient Instructions (Addendum)
Ok to continue Trelegy- glad it has been such a help  Please call if we can help

## 2017-06-15 NOTE — Progress Notes (Signed)
HPI female never smoker followed for asthma, allergic rhinitis, complicated by rheumatoid arthritis PFT 02/02/09-mild obstruction small airways, minimal response bronchodilator, DLCO mildly reduced. FVC 3.36/116%, FEV1 2.48/119%, ratio 0.74, FEF 25-75% 1.80/75%, TLC 108%, DLCO 70% Office Spirometry 09/08/16-WNL-FVC 2.63/98%, FEV1 1.91/95%, ratio 0.73, FEF 25-75% 1.32/82% FENO 02/12/17- 32H -------------------------------------------------------------------------------------  02/12/17- 77 year old female never smoker followed for asthma, chronic cough, allergic rhinitis, complicated by rheumatoid arthritis Follows for: asthma.  pt states she has had lots of coughing, wheezing, SOB throughout the summer, notes improvement over the past 5 days.  pt has been using rescue inhaler daily.  Now using Stiolto Was better with Trelegy. Virgel Bouquet may be worth another try, with Stiolto. She denies reflux. FENO 02/12/17- 32H  06/15/17- 77 year old female never smoker followed for asthma, chronic cough, allergic rhinitis, complicated by rheumatoid arthritis Asthma: Pt states she is doing well with Trelegy inhaler.  She feels very much better, partly because Trelegy stop her cough or other inhalers did not, and partly because she is on an effective regimen now for her rheumatoid arthritis.  Denies active need for rescue inhaler, sleep disturbance or frequent wheezing.  ROS-see HPI    + = positive Constitutional:   No-   weight loss, night sweats, fevers, chills, fatigue, lassitude. HEENT:   No-  headaches, difficulty swallowing, tooth/dental problems, sore throat,       No-  sneezing, itching, ear ache, nasal congestion, post nasal drip,  CV:  No-   chest pain, orthopnea, PND, +swelling in lower extremities, anasarca, dizziness, palpitations Resp:   + shortness of breath with exertion or at rest.              No-   productive cough,   non-productive cough,  No- coughing up of blood.              No-   change in color of  mucus.  +rare wheezing.   Skin: No-   rash or lesions. GI:  No-   heartburn, indigestion, abdominal pain, nausea, vomiting,  GU:  MS: + joint pain or swelling.   Neuro-     nothing unusual Psych:  No- change in mood or affect. No depression or anxiety.  No memory loss.  OBJ        General- Alert, Oriented, Affect-appropriate, Distress- none acute. + Morbidly obese Skin- rash-none, lesions- none, excoriation- none Lymphadenopathy- none Head- atraumatic            Eyes- Gross vision intact, PERRLA, conjunctivae clear secretions            Ears- Hearing, canals-normal            Nose- Clear, no-Septal dev, mucus, polyps, erosion, perforation             Throat- Mallampati III , mucosa clear , drainage- none, tonsils- atrophic. +Easy gag. Neck- flexible , trachea midline, no stridor , thyroid nl, carotid no bruit Chest - symmetrical excursion , unlabored           Heart/CV- RRR , no murmur , no gallop  , no rub, nl s1 s2                           - JVD- none , edema- none, stasis changes- none, varices- none           Lung- clear, wheeze- none, cough- none , dullness-none, rub- none           Chest wall-  Abd-  Br/ Gen/ Rectal- Not done, not indicated Extrem- cyanosis- none, clubbing, none, atrophy- none, strength- nl Neuro- grossly intact to observation

## 2017-06-15 NOTE — Assessment & Plan Note (Signed)
Significantly improved using Trelegy with resolution of cough equivalent asthma component.  When she had tried Trelegy samples, cough returned as she switched back from Trelegy to [place].  She is very pleased now.

## 2017-06-17 LAB — QUANTIFERON-TB GOLD PLUS
NIL: 0.03 [IU]/mL
QUANTIFERON-TB GOLD PLUS: NEGATIVE
TB1-NIL: 0.01 IU/mL
TB2-NIL: 0.01 IU/mL

## 2017-07-09 ENCOUNTER — Other Ambulatory Visit: Payer: Self-pay | Admitting: Rheumatology

## 2017-07-09 ENCOUNTER — Other Ambulatory Visit: Payer: Self-pay | Admitting: *Deleted

## 2017-07-09 DIAGNOSIS — M0579 Rheumatoid arthritis with rheumatoid factor of multiple sites without organ or systems involvement: Secondary | ICD-10-CM

## 2017-07-09 NOTE — Telephone Encounter (Signed)
Last Visit: 05/23/17 Next Visit: 10/31/17 Labs: 06/13/17 ALT stable. All other lab results are WNL. PLQ Eye exam: 4/24/18WNL  Okay to refill per Dr. Corliss Skains

## 2017-07-11 ENCOUNTER — Encounter (HOSPITAL_COMMUNITY)
Admission: RE | Admit: 2017-07-11 | Discharge: 2017-07-11 | Disposition: A | Discharge status: Home / Self Care | Payer: Medicare Other | Source: Ambulatory Visit | Attending: Rheumatology | Admitting: Rheumatology

## 2017-07-11 DIAGNOSIS — M0579 Rheumatoid arthritis with rheumatoid factor of multiple sites without organ or systems involvement: Secondary | ICD-10-CM | POA: Insufficient documentation

## 2017-07-11 MED ORDER — SODIUM CHLORIDE 0.9 % IV SOLN
INTRAVENOUS | Status: DC
  Administered 2017-07-11: 250 mL via INTRAVENOUS

## 2017-07-11 MED ORDER — SODIUM CHLORIDE 0.9 % IV SOLN
1000.0000 mg | INTRAVENOUS | Status: DC
  Administered 2017-07-11: 1000 mg via INTRAVENOUS
  Filled 2017-07-11: qty 40

## 2017-08-03 ENCOUNTER — Other Ambulatory Visit: Payer: Self-pay | Admitting: *Deleted

## 2017-08-03 DIAGNOSIS — M0579 Rheumatoid arthritis with rheumatoid factor of multiple sites without organ or systems involvement: Secondary | ICD-10-CM

## 2017-08-08 ENCOUNTER — Encounter (HOSPITAL_COMMUNITY)
Admission: RE | Admit: 2017-08-08 | Discharge: 2017-08-08 | Disposition: A | Discharge status: Home / Self Care | Payer: Medicare Other | Source: Ambulatory Visit | Attending: Rheumatology | Admitting: Rheumatology

## 2017-08-08 ENCOUNTER — Encounter (HOSPITAL_COMMUNITY): Payer: Self-pay

## 2017-08-08 DIAGNOSIS — M0579 Rheumatoid arthritis with rheumatoid factor of multiple sites without organ or systems involvement: Secondary | ICD-10-CM | POA: Diagnosis not present

## 2017-08-08 LAB — COMPREHENSIVE METABOLIC PANEL
ALK PHOS: 69 U/L (ref 38–126)
ALT: 13 U/L — AB (ref 14–54)
ANION GAP: 11 (ref 5–15)
AST: 17 U/L (ref 15–41)
Albumin: 3.8 g/dL (ref 3.5–5.0)
BUN: 15 mg/dL (ref 6–20)
CALCIUM: 9.2 mg/dL (ref 8.9–10.3)
CO2: 27 mmol/L (ref 22–32)
CREATININE: 0.75 mg/dL (ref 0.44–1.00)
Chloride: 103 mmol/L (ref 101–111)
GFR calc non Af Amer: >60 mL/min (ref >60)
Glucose, Bld: 88 mg/dL (ref 65–99)
Potassium: 3.7 mmol/L (ref 3.5–5.1)
SODIUM: 141 mmol/L (ref 135–145)
TOTAL PROTEIN: 7.3 g/dL (ref 6.5–8.1)
Total Bilirubin: 0.8 mg/dL (ref 0.3–1.2)

## 2017-08-08 LAB — CBC
HCT: 40.3 % (ref 36.0–46.0)
HEMOGLOBIN: 13.1 g/dL (ref 12.0–15.0)
MCH: 31.4 pg (ref 26.0–34.0)
MCHC: 32.5 g/dL (ref 30.0–36.0)
MCV: 96.6 fL (ref 78.0–100.0)
Platelets: 323 10*3/uL (ref 150–400)
RBC: 4.17 MIL/uL (ref 3.87–5.11)
RDW: 13 % (ref 11.5–15.5)
WBC: 6.5 10*3/uL (ref 4.0–10.5)

## 2017-08-08 MED ORDER — SODIUM CHLORIDE 0.9 % IV SOLN
Freq: Once | INTRAVENOUS | Status: AC
  Administered 2017-08-08: 10:00:00 via INTRAVENOUS

## 2017-08-08 MED ORDER — SODIUM CHLORIDE 0.9 % IV SOLN
1000.0000 mg | INTRAVENOUS | Status: DC
  Administered 2017-08-08: 1000 mg via INTRAVENOUS
  Filled 2017-08-08: qty 40

## 2017-08-08 MED ORDER — DIPHENHYDRAMINE HCL 25 MG PO CAPS: 25.0000 mg | ORAL_CAPSULE | ORAL | Status: DC

## 2017-08-08 MED ORDER — ACETAMINOPHEN 325 MG PO TABS: 650.0000 mg | ORAL_TABLET | ORAL | Status: DC

## 2017-08-14 DIAGNOSIS — H2513 Age-related nuclear cataract, bilateral: Secondary | ICD-10-CM | POA: Diagnosis not present

## 2017-08-14 DIAGNOSIS — Z79899 Other long term (current) drug therapy: Secondary | ICD-10-CM | POA: Diagnosis not present

## 2017-08-14 DIAGNOSIS — H524 Presbyopia: Secondary | ICD-10-CM | POA: Diagnosis not present

## 2017-08-14 DIAGNOSIS — H25013 Cortical age-related cataract, bilateral: Secondary | ICD-10-CM | POA: Diagnosis not present

## 2017-09-04 ENCOUNTER — Other Ambulatory Visit: Payer: Self-pay | Admitting: *Deleted

## 2017-09-04 ENCOUNTER — Telehealth: Payer: Self-pay | Admitting: Rheumatology

## 2017-09-04 DIAGNOSIS — M0579 Rheumatoid arthritis with rheumatoid factor of multiple sites without organ or systems involvement: Secondary | ICD-10-CM

## 2017-09-04 NOTE — Telephone Encounter (Signed)
Clydie Braun from [facility] called stating that patient is scheduled for Orencia infusion tomorrow 09/05/17 and are requesting orders to be sent through epic.

## 2017-09-04 NOTE — Telephone Encounter (Signed)
Infusion orders placed

## 2017-09-05 ENCOUNTER — Encounter (HOSPITAL_COMMUNITY)
Admission: RE | Admit: 2017-09-05 | Discharge: 2017-09-05 | Disposition: A | Discharge status: Home / Self Care | Payer: Medicare Other | Source: Ambulatory Visit | Attending: Rheumatology | Admitting: Rheumatology

## 2017-09-05 DIAGNOSIS — M0579 Rheumatoid arthritis with rheumatoid factor of multiple sites without organ or systems involvement: Secondary | ICD-10-CM | POA: Diagnosis not present

## 2017-09-05 MED ORDER — SODIUM CHLORIDE 0.9 % IV SOLN
1000.0000 mg | INTRAVENOUS | Status: DC
  Administered 2017-09-05: 1000 mg via INTRAVENOUS
  Filled 2017-09-05: qty 40

## 2017-09-05 MED ORDER — ACETAMINOPHEN 325 MG PO TABS: 650.0000 mg | ORAL_TABLET | ORAL | Status: DC

## 2017-09-05 MED ORDER — DIPHENHYDRAMINE HCL 25 MG PO CAPS: 25.0000 mg | ORAL_CAPSULE | ORAL | Status: DC

## 2017-09-27 DIAGNOSIS — H25011 Cortical age-related cataract, right eye: Secondary | ICD-10-CM | POA: Diagnosis not present

## 2017-09-27 DIAGNOSIS — H25041 Posterior subcapsular polar age-related cataract, right eye: Secondary | ICD-10-CM | POA: Diagnosis not present

## 2017-09-27 DIAGNOSIS — H2511 Age-related nuclear cataract, right eye: Secondary | ICD-10-CM | POA: Diagnosis not present

## 2017-09-27 DIAGNOSIS — H25811 Combined forms of age-related cataract, right eye: Secondary | ICD-10-CM | POA: Diagnosis not present

## 2017-10-05 ENCOUNTER — Other Ambulatory Visit: Payer: Self-pay | Admitting: *Deleted

## 2017-10-05 ENCOUNTER — Telehealth: Payer: Self-pay | Admitting: Rheumatology

## 2017-10-05 DIAGNOSIS — M0579 Rheumatoid arthritis with rheumatoid factor of multiple sites without organ or systems involvement: Secondary | ICD-10-CM

## 2017-10-05 NOTE — Telephone Encounter (Signed)
Orders for Orencia are not in for patients Tuesday appt. Please put in for [facility] Short Stay.  Fax# [phone]

## 2017-10-05 NOTE — Telephone Encounter (Signed)
Infusion orders placed

## 2017-10-08 ENCOUNTER — Encounter (HOSPITAL_COMMUNITY)
Admission: RE | Admit: 2017-10-08 | Discharge: 2017-10-08 | Disposition: A | Discharge status: Home / Self Care | Payer: Medicare Other | Source: Ambulatory Visit | Attending: Rheumatology | Admitting: Rheumatology

## 2017-10-08 ENCOUNTER — Other Ambulatory Visit: Payer: Self-pay | Admitting: Rheumatology

## 2017-10-08 ENCOUNTER — Encounter (HOSPITAL_COMMUNITY): Payer: Self-pay

## 2017-10-08 DIAGNOSIS — M0579 Rheumatoid arthritis with rheumatoid factor of multiple sites without organ or systems involvement: Secondary | ICD-10-CM | POA: Diagnosis not present

## 2017-10-08 LAB — COMPREHENSIVE METABOLIC PANEL
ALBUMIN: 3.9 g/dL (ref 3.5–5.0)
ALK PHOS: 80 U/L (ref 38–126)
ALT: 15 U/L (ref 14–54)
AST: 19 U/L (ref 15–41)
Anion gap: 10 (ref 5–15)
BILIRUBIN TOTAL: 0.7 mg/dL (ref 0.3–1.2)
BUN: 15 mg/dL (ref 6–20)
CO2: 28 mmol/L (ref 22–32)
CREATININE: 0.68 mg/dL (ref 0.44–1.00)
Calcium: 9.4 mg/dL (ref 8.9–10.3)
Chloride: 101 mmol/L (ref 101–111)
GFR calc Af Amer: >60 mL/min (ref >60)
GFR calc non Af Amer: >60 mL/min (ref >60)
GLUCOSE: 92 mg/dL (ref 65–99)
Potassium: 4.4 mmol/L (ref 3.5–5.1)
Sodium: 139 mmol/L (ref 135–145)
TOTAL PROTEIN: 7.7 g/dL (ref 6.5–8.1)

## 2017-10-08 LAB — CBC
HEMATOCRIT: 41.2 % (ref 36.0–46.0)
HEMOGLOBIN: 13.4 g/dL (ref 12.0–15.0)
MCH: 31.6 pg (ref 26.0–34.0)
MCHC: 32.5 g/dL (ref 30.0–36.0)
MCV: 97.2 fL (ref 78.0–100.0)
Platelets: 324 10*3/uL (ref 150–400)
RBC: 4.24 MIL/uL (ref 3.87–5.11)
RDW: 13.4 % (ref 11.5–15.5)
WBC: 8 10*3/uL (ref 4.0–10.5)

## 2017-10-08 MED ORDER — SODIUM CHLORIDE 0.9 % IV SOLN
1000.0000 mg | Freq: Once | INTRAVENOUS | Status: AC
  Administered 2017-10-08: 1000 mg via INTRAVENOUS
  Filled 2017-10-08: qty 40

## 2017-10-08 MED ORDER — ACETAMINOPHEN 325 MG PO TABS: 650.0000 mg | ORAL_TABLET | ORAL | Status: DC

## 2017-10-08 MED ORDER — DIPHENHYDRAMINE HCL 25 MG PO CAPS: 25.0000 mg | ORAL_CAPSULE | ORAL | Status: DC

## 2017-10-08 NOTE — Progress Notes (Signed)
WNL

## 2017-10-08 NOTE — Telephone Encounter (Signed)
Last Visit: 05/23/17 Next Visit: 10/31/17 Labs: 08/08/17 ALT stable. All other labs are WNL. PLQ eye exam: 08/14/2017 normal.   Okay to refill per Dr. Corliss Skains

## 2017-10-16 DIAGNOSIS — F339 Major depressive disorder, recurrent, unspecified: Secondary | ICD-10-CM | POA: Diagnosis not present

## 2017-10-16 DIAGNOSIS — I10 Essential (primary) hypertension: Secondary | ICD-10-CM | POA: Diagnosis not present

## 2017-10-16 DIAGNOSIS — E7800 Pure hypercholesterolemia, unspecified: Secondary | ICD-10-CM | POA: Diagnosis not present

## 2017-10-16 DIAGNOSIS — F419 Anxiety disorder, unspecified: Secondary | ICD-10-CM | POA: Diagnosis not present

## 2017-10-16 DIAGNOSIS — J45909 Unspecified asthma, uncomplicated: Secondary | ICD-10-CM | POA: Diagnosis not present

## 2017-10-16 DIAGNOSIS — R7301 Impaired fasting glucose: Secondary | ICD-10-CM | POA: Diagnosis not present

## 2017-10-16 DIAGNOSIS — M069 Rheumatoid arthritis, unspecified: Secondary | ICD-10-CM | POA: Diagnosis not present

## 2017-10-22 NOTE — Progress Notes (Signed)
Office Visit Note  Patient: Monica [name]             Date of Birth: [birth year]           MRN: [id number]             PCP: Juluis Rainier, MD Referring: Juluis Rainier, MD Visit Date: 10/31/2017 Occupation: @GUAROCC @    Subjective:  Pain in right shoulder  History of Present Illness: Monica [name] is a 77 y.o. female with history of seropositive rheumatoid arthritis and osteoarthritis.  She states about 5 weeks ago she was pulling some weeds in her yard and after that she started having discomfort in her right shoulder joint.  She states the discomfort is still persist which is on the anterior aspect of her right shoulder.  Her arthritis is quite well controlled.  She states she has some discomfort in her right [facility] joint with any activities.  She has some discomfort in her right knee joint off and on.  She states at night she sometimes feels some tingling in her toes.  Which gets better when she walks around.  He had right eye cataract surgery in May 2019.  Activities of Daily Living:  Patient reports morning stiffness for 2-4 hours.   Patient Denies nocturnal pain.  Difficulty dressing/grooming: Denies Difficulty climbing stairs: Reports Difficulty getting out of chair: Denies Difficulty using hands for taps, buttons, cutlery, and/or writing: Reports   Review of Systems  Constitutional: Positive for fatigue. Negative for fever, night sweats, weight gain and weight loss.  HENT: Negative for ear pain, mouth sores, trouble swallowing, trouble swallowing, mouth dryness and nose dryness.   Eyes: Positive for dryness. Negative for pain, redness and visual disturbance.  Respiratory: Positive for cough. Negative for hemoptysis, shortness of breath and difficulty breathing.        History of asthma and allergies  Cardiovascular: Negative for chest pain, palpitations, hypertension, irregular heartbeat and swelling in legs/feet.  Gastrointestinal: Negative for blood in stool,  constipation and diarrhea.  Endocrine: Negative for increased urination.  Genitourinary: Negative for difficulty urinating, painful urination and vaginal dryness.  Musculoskeletal: Positive for arthralgias, joint pain, myalgias, muscle weakness, morning stiffness and myalgias. Negative for joint swelling and muscle tenderness.  Skin: Negative for color change, pallor, rash, hair loss, nodules/bumps, skin tightness, ulcers and sensitivity to sunlight.  Allergic/Immunologic: Negative for susceptible to infections.  Neurological: Negative for dizziness, headaches, memory loss, night sweats and weakness.  Hematological: Negative for bruising/bleeding tendency and swollen glands.  Psychiatric/Behavioral: Positive for sleep disturbance. Negative for depressed mood and suicidal ideas. The patient is not nervous/anxious.     PMFS History:  Patient Active Problem List   Diagnosis Date Noted  . Osteopenia 12/20/2016  . History of depression 12/11/2016  . Asthma, mild persistent 09/08/2016  . High risk medication use 05/15/2016  . Primary osteoarthritis of both hands 05/15/2016  . Primary osteoarthritis of both feet 05/15/2016  . Primary osteoarthritis of both knees 05/15/2016  . Vitamin D deficiency 05/15/2016  . History of hypertension 05/15/2016  . History of hyperlipidemia 05/15/2016  . Rheumatoid arthritis with rheumatoid factor of multiple sites without organ or systems involvement (HCC) 01/10/2016  . Seasonal allergic rhinitis 10/03/2011  . HYPERLIPIDEMIA 12/31/2008  . HYPERTENSION 12/31/2008  . Mild persistent asthmatic bronchitis with exacerbation 12/31/2008    Past Medical History:  Diagnosis Date  . Arthritis   . 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 Hyperlipidemia   .  Hypertension     Family History  Problem Relation Age of Onset  . Lung cancer Father   . Alzheimer's disease Mother    Past Surgical History:  Procedure Laterality Date  . CARPAL  TUNNEL RELEASE    . FOOT SURGERY     Social History   Social History Narrative  . Not on file     Objective: Vital Signs: BP 114/74 (BP Location: Left Arm, Patient Position: Sitting, Cuff Size: Normal)   Pulse 76   Ht 5\' 3"  (1.6 m)   Wt 238 lb (108 kg)   BMI 42.16 kg/m    Physical Exam  Constitutional: She is oriented to person, place, and time. She appears well-developed and well-nourished.  HENT:  Head: Normocephalic and atraumatic.  Eyes: Conjunctivae and EOM are normal.  Neck: Normal range of motion.  Cardiovascular: Normal rate, regular rhythm, normal heart sounds and intact distal pulses.  Pulmonary/Chest: Effort normal and breath sounds normal.  Abdominal: Soft. Bowel sounds are normal.  Lymphadenopathy:    She has no cervical adenopathy.  Neurological: She is alert and oriented to person, place, and time.  Skin: Skin is warm and dry. Capillary refill takes less than 2 seconds.  Psychiatric: She has a normal mood and affect. Her behavior is normal.  Nursing note and vitals reviewed.    Musculoskeletal Exam: C-spine thoracic and lumbar spine good range of motion.  Shoulder joints she has discomfort range of motion of her right shoulder joint with abduction consistent with subacromial bursitis.  Elbow joints wrist joint MCPs were in good range of motion.  She had right CMC thickening and PIP/DIP thickening consistent with osteoarthritis.  Hip joints knee joints ankles were in good range of motion.  She has bilateral dorsal spurs.  There was no active synovitis on examination.  CDAI Exam: CDAI Homunculus Exam:   Joint Counts:  CDAI Tender Joint count: 0 CDAI Swollen Joint count: 0  Global Assessments:  Patient Global Assessment: 3 Provider Global Assessment: 2  CDAI Calculated Score: 5    Investigation: TB Gold 02/19 negative No additional findings. CBC Latest Ref Rng & Units 10/08/2017 08/08/2017 06/13/2017  WBC 4.0 - 10.5 K/uL 8.0 6.5 7.1  Hemoglobin 12.0 -  15.0 g/dL 56.9 79.4 80.1  Hematocrit 36.0 - 46.0 % 41.2 40.3 39.9  Platelets 150 - 400 K/uL 324 323 343   CMP Latest Ref Rng & Units 10/08/2017 08/08/2017 06/13/2017  Glucose 65 - 99 mg/dL 92 88 72  BUN 6 - 20 mg/dL 15 15 16   Creatinine 0.44 - 1.00 mg/dL 6.55 3.74 8.27  Sodium 135 - 145 mmol/L 139 141 140  Potassium 3.5 - 5.1 mmol/L 4.4 3.7 3.5  Chloride 101 - 111 mmol/L 101 103 102  CO2 22 - 32 mmol/L 28 27 27   Calcium 8.9 - 10.3 mg/dL 9.4 9.2 9.1  Total Protein 6.5 - 8.1 g/dL 7.7 7.3 7.4  Total Bilirubin 0.3 - 1.2 mg/dL 0.7 0.8 0.6  Alkaline Phos 38 - 126 U/L 80 69 66  AST 15 - 41 U/L 19 17 19   ALT 14 - 54 U/L 15 13(L) 13(L)     Imaging: No results found.  Speciality Comments: PLQ eye exam: 08/14/2017 normal. [facility]. Follow up in 1 year. Orencia 1000 mg / started on 09/2016 has had inadequate response to Simponi Aria infusions Tb gold negative on 07/24/16    Procedures:  No procedures performed Allergies: Arava [leflunomide] and Piroxicam   Assessment / Plan:  Visit Diagnoses: Rheumatoid arthritis with rheumatoid factor of multiple sites without organ or systems involvement (HCC) - +CCP: She is clinically doing much better with no synovitis on examination today.  She will continue on Orencia IV and Plaquenil 300 mg daily.    High risk medication use - Orencia IV(09/2016) and Plaquenil 300 mg po qd ( has had inadequate response to Simponi Aria, Reaction to Arava, Methotrexate caused oral ulcers ).  Her labs have been stable.  She will continue to get her labs every 3 months with her infusions.  Her TB Gold is due in February 2020.  Acute bursitis of right shouldershe developed subacromial bursitis in the right shoulder joint after pulling weeds.  I offered injection which she declined.  I have given her a handout on shoulder joint exercises.  If her symptoms persist she is supposed to notify me.  Primary osteoarthritis of both hands-she has been having discomfort  in her right CMC joint.  I offered right CMC brace but she would like to hold off.  Primary osteoarthritis of both knees-she continues to have some discomfort in her knee joints.  Weight loss diet and exercise was discussed.  Primary osteoarthritis of both feet-she has bilateral dorsal spurs.  She has had injections by Dr. Charlsie Merles in the past without much relief.  Proper fitting shoes were discussed.  Osteopenia of multiple sites-she is on calcium and vitamin D.  Her DEXA is not due for another year.  History of vitamin D deficiency  History of depression  History of hyperlipidemia  History of hypertension  History of asthma   Association of heart disease with rheumatoid arthritis was discussed. Need to monitor blood pressure, cholesterol, and to exercise 30-60 minutes on daily basis was discussed. Poor dental hygiene can be a predisposing factor for rheumatoid arthritis. Good dental hygiene was discussed. Orders: No orders of the defined types were placed in this encounter.  No orders of the defined types were placed in this encounter.   Face-to-face time spent with patient was 30 minutes. >50% of time was spent in counseling and coordination of care.  Follow-Up Instructions: Return in about 5 months (around 04/02/2018) for Rheumatoid arthritis, Osteoarthritis.   Pollyann Savoy, MD  Note - This record has been created using Animal nutritionist.  Chart creation errors have been sought, but may not always  have been located. Such creation errors do not reflect on  the standard of medical care.

## 2017-10-31 ENCOUNTER — Ambulatory Visit (INDEPENDENT_AMBULATORY_CARE_PROVIDER_SITE_OTHER): Payer: Medicare Other | Admitting: Rheumatology

## 2017-10-31 ENCOUNTER — Encounter: Payer: Self-pay | Admitting: Rheumatology

## 2017-10-31 VITALS — BP 114/74 | HR 76 | Ht 63.0 in | Wt 238.0 lb

## 2017-10-31 DIAGNOSIS — M85852 Other specified disorders of bone density and structure, left thigh: Secondary | ICD-10-CM | POA: Diagnosis not present

## 2017-10-31 DIAGNOSIS — Z79899 Other long term (current) drug therapy: Secondary | ICD-10-CM | POA: Diagnosis not present

## 2017-10-31 DIAGNOSIS — M19071 Primary osteoarthritis, right ankle and foot: Secondary | ICD-10-CM

## 2017-10-31 DIAGNOSIS — Z8639 Personal history of other endocrine, nutritional and metabolic disease: Secondary | ICD-10-CM | POA: Diagnosis not present

## 2017-10-31 DIAGNOSIS — M0579 Rheumatoid arthritis with rheumatoid factor of multiple sites without organ or systems involvement: Secondary | ICD-10-CM

## 2017-10-31 DIAGNOSIS — M19041 Primary osteoarthritis, right hand: Secondary | ICD-10-CM

## 2017-10-31 DIAGNOSIS — Z8679 Personal history of other diseases of the circulatory system: Secondary | ICD-10-CM

## 2017-10-31 DIAGNOSIS — M7551 Bursitis of right shoulder: Secondary | ICD-10-CM | POA: Diagnosis not present

## 2017-10-31 DIAGNOSIS — Z8709 Personal history of other diseases of the respiratory system: Secondary | ICD-10-CM | POA: Diagnosis not present

## 2017-10-31 DIAGNOSIS — M170 Bilateral primary osteoarthritis of knee: Secondary | ICD-10-CM

## 2017-10-31 DIAGNOSIS — M19072 Primary osteoarthritis, left ankle and foot: Secondary | ICD-10-CM

## 2017-10-31 DIAGNOSIS — M8589 Other specified disorders of bone density and structure, multiple sites: Secondary | ICD-10-CM | POA: Diagnosis not present

## 2017-10-31 DIAGNOSIS — M19042 Primary osteoarthritis, left hand: Secondary | ICD-10-CM

## 2017-10-31 DIAGNOSIS — Z8659 Personal history of other mental and behavioral disorders: Secondary | ICD-10-CM | POA: Diagnosis not present

## 2017-10-31 NOTE — Patient Instructions (Signed)
Association of heart disease with rheumatoid arthritis was discussed. Need to monitor blood pressure, cholesterol, and to exercise 30-60 minutes on daily basis was discussed. Poor dental hygiene can be a predisposing factor for rheumatoid arthritis. Good dental hygiene was discussed.   Shoulder Exercises Ask your health care provider which exercises are safe for you. Do exercises exactly as told by your health care provider and adjust them as directed. It is normal to feel mild stretching, pulling, tightness, or discomfort as you do these exercises, but you should stop right away if you feel sudden pain or your pain gets worse.Do not begin these exercises until told by your health care provider. RANGE OF MOTION EXERCISES These exercises warm up your muscles and joints and improve the movement and flexibility of your shoulder. These exercises also help to relieve pain, numbness, and tingling. These exercises involve stretching your injured shoulder directly. Exercise A: Pendulum  1. Stand near a wall or a surface that you can hold onto for balance. 2. Bend at the waist and let your left / right arm hang straight down. Use your other arm to support you. Keep your back straight and do not lock your knees. 3. Relax your left / right arm and shoulder muscles, and move your hips and your trunk so your left / right arm swings freely. Your arm should swing because of the motion of your body, not because you are using your arm or shoulder muscles. 4. Keep moving your body so your arm swings in the following directions, as told by your health care provider: ? Side to side. ? Forward and backward. ? In clockwise and counterclockwise circles. 5. Continue each motion for __________ seconds, or for as long as told by your health care provider. 6. Slowly return to the starting position. Repeat __________ times. Complete this exercise __________ times a day. Exercise B:Flexion, Standing  1. Stand and hold a  broomstick, a cane, or a similar object. Place your hands a little more than shoulder-width apart on the object. Your left / right hand should be palm-up, and your other hand should be palm-down. 2. Keep your elbow straight and keep your shoulder muscles relaxed. Push the stick down with your healthy arm to raise your left / right arm in front of your body, and then over your head until you feel a stretch in your shoulder. ? Avoid shrugging your shoulder while you raise your arm. Keep your shoulder blade tucked down toward the middle of your back. 3. Hold for __________ seconds. 4. Slowly return to the starting position. Repeat __________ times. Complete this exercise __________ times a day. Exercise C: Abduction, Standing 1. Stand and hold a broomstick, a cane, or a similar object. Place your hands a little more than shoulder-width apart on the object. Your left / right hand should be palm-up, and your other hand should be palm-down. 2. While keeping your elbow straight and your shoulder muscles relaxed, push the stick across your body toward your left / right side. Raise your left / right arm to the side of your body and then over your head until you feel a stretch in your shoulder. ? Do not raise your arm above shoulder height, unless your health care provider tells you to do that. ? Avoid shrugging your shoulder while you raise your arm. Keep your shoulder blade tucked down toward the middle of your back. 3. Hold for __________ seconds. 4. Slowly return to the starting position. Repeat __________ times. Complete this  exercise __________ times a day. Exercise D:Internal Rotation  1. Place your left / right hand behind your back, palm-up. 2. Use your other hand to dangle an exercise band, a towel, or a similar object over your shoulder. Grasp the band with your left / right hand so you are holding onto both ends. 3. Gently pull up on the band until you feel a stretch in the front of your left /  right shoulder. ? Avoid shrugging your shoulder while you raise your arm. Keep your shoulder blade tucked down toward the middle of your back. 4. Hold for __________ seconds. 5. Release the stretch by letting go of the band and lowering your hands. Repeat __________ times. Complete this exercise __________ times a day. STRETCHING EXERCISES These exercises warm up your muscles and joints and improve the movement and flexibility of your shoulder. These exercises also help to relieve pain, numbness, and tingling. These exercises are done using your healthy shoulder to help stretch the muscles of your injured shoulder. Exercise E: Research officer, political party (External Rotation and Abduction)  1. Stand in a doorway with one of your feet slightly in front of the other. This is called a staggered stance. If you cannot reach your forearms to the door frame, stand facing a corner of a room. 2. Choose one of the following positions as told by your health care provider: ? Place your hands and forearms on the door frame above your head. ? Place your hands and forearms on the door frame at the height of your head. ? Place your hands on the door frame at the height of your elbows. 3. Slowly move your weight onto your front foot until you feel a stretch across your chest and in the front of your shoulders. Keep your head and chest upright and keep your abdominal muscles tight. 4. Hold for __________ seconds. 5. To release the stretch, shift your weight to your back foot. Repeat __________ times. Complete this stretch __________ times a day. Exercise F:Extension, Standing 1. Stand and hold a broomstick, a cane, or a similar object behind your back. ? Your hands should be a little wider than shoulder-width apart. ? Your palms should face away from your back. 2. Keeping your elbows straight and keeping your shoulder muscles relaxed, move the stick away from your body until you feel a stretch in your shoulder. ? Avoid  shrugging your shoulders while you move the stick. Keep your shoulder blade tucked down toward the middle of your back. 3. Hold for __________ seconds. 4. Slowly return to the starting position. Repeat __________ times. Complete this exercise __________ times a day. STRENGTHENING EXERCISES These exercises build strength and endurance in your shoulder. Endurance is the ability to use your muscles for a long time, even after they get tired. Exercise G:External Rotation  1. Sit in a stable chair without armrests. 2. Secure an exercise band at elbow height on your left / right side. 3. Place a soft object, such as a folded towel or a small pillow, between your left / right upper arm and your body to move your elbow a few inches away (about 10 cm) from your side. 4. Hold the end of the band so it is tight and there is no slack. 5. Keeping your elbow pressed against the soft object, move your left / right forearm out, away from your abdomen. Keep your body steady so only your forearm moves. 6. Hold for __________ seconds. 7. Slowly return to the starting position.  Repeat __________ times. Complete this exercise __________ times a day. Exercise H:Shoulder Abduction  1. Sit in a stable chair without armrests, or stand. 2. Hold a __________ weight in your left / right hand, or hold an exercise band with both hands. 3. Start with your arms straight down and your left / right palm facing in, toward your body. 4. Slowly lift your left / right hand out to your side. Do not lift your hand above shoulder height unless your health care provider tells you that this is safe. ? Keep your arms straight. ? Avoid shrugging your shoulder while you do this movement. Keep your shoulder blade tucked down toward the middle of your back. 5. Hold for __________ seconds. 6. Slowly lower your arm, and return to the starting position. Repeat __________ times. Complete this exercise __________ times a day. Exercise  I:Shoulder Extension 1. Sit in a stable chair without armrests, or stand. 2. Secure an exercise band to a stable object in front of you where it is at shoulder height. 3. Hold one end of the exercise band in each hand. Your palms should face each other. 4. Straighten your elbows and lift your hands up to shoulder height. 5. Step back, away from the secured end of the exercise band, until the band is tight and there is no slack. 6. Squeeze your shoulder blades together as you pull your hands down to the sides of your thighs. Stop when your hands are straight down by your sides. Do not let your hands go behind your body. 7. Hold for __________ seconds. 8. Slowly return to the starting position. Repeat __________ times. Complete this exercise __________ times a day. Exercise J:Standing Shoulder Row 1. Sit in a stable chair without armrests, or stand. 2. Secure an exercise band to a stable object in front of you so it is at waist height. 3. Hold one end of the exercise band in each hand. Your palms should be in a thumbs-up position. 4. Bend each of your elbows to an "L" shape (about 90 degrees) and keep your upper arms at your sides. 5. Step back until the band is tight and there is no slack. 6. Slowly pull your elbows back behind you. 7. Hold for __________ seconds. 8. Slowly return to the starting position. Repeat __________ times. Complete this exercise __________ times a day. Exercise K:Shoulder Press-Ups  1. Sit in a stable chair that has armrests. Sit upright, with your feet flat on the floor. 2. Put your hands on the armrests so your elbows are bent and your fingers are pointing forward. Your hands should be about even with the sides of your body. 3. Push down on the armrests and use your arms to lift yourself off of the chair. Straighten your elbows and lift yourself up as much as you comfortably can. ? Move your shoulder blades down, and avoid letting your shoulders move up toward  your ears. ? Keep your feet on the ground. As you get stronger, your feet should support less of your body weight as you lift yourself up. 4. Hold for __________ seconds. 5. Slowly lower yourself back into the chair. Repeat __________ times. Complete this exercise __________ times a day. Exercise L: Wall Push-Ups  1. Stand so you are facing a stable wall. Your feet should be about one arm-length away from the wall. 2. Lean forward and place your palms on the wall at shoulder height. 3. Keep your feet flat on the floor as you bend  your elbows and lean forward toward the wall. 4. Hold for __________ seconds. 5. Straighten your elbows to push yourself back to the starting position. Repeat __________ times. Complete this exercise __________ times a day. This information is not intended to replace advice given to you by your health care provider. Make sure you discuss any questions you have with your health care provider. Document Released: 03/08/2005 Document Revised: 01/17/2016 Document Reviewed: 01/03/2015 Elsevier Interactive Patient Education  2018 [employer].

## 2017-11-02 ENCOUNTER — Other Ambulatory Visit: Payer: Self-pay | Admitting: *Deleted

## 2017-11-02 ENCOUNTER — Telehealth: Payer: Self-pay | Admitting: Rheumatology

## 2017-11-02 DIAGNOSIS — Z886 Allergy status to analgesic agent status: Secondary | ICD-10-CM | POA: Diagnosis not present

## 2017-11-02 DIAGNOSIS — H02831 Dermatochalasis of right upper eyelid: Secondary | ICD-10-CM | POA: Diagnosis not present

## 2017-11-02 DIAGNOSIS — H02834 Dermatochalasis of left upper eyelid: Secondary | ICD-10-CM | POA: Diagnosis not present

## 2017-11-02 DIAGNOSIS — Z48810 Encounter for surgical aftercare following surgery on the sense organs: Secondary | ICD-10-CM | POA: Diagnosis not present

## 2017-11-02 DIAGNOSIS — M0579 Rheumatoid arthritis with rheumatoid factor of multiple sites without organ or systems involvement: Secondary | ICD-10-CM

## 2017-11-02 DIAGNOSIS — Z961 Presence of intraocular lens: Secondary | ICD-10-CM | POA: Diagnosis not present

## 2017-11-02 DIAGNOSIS — H43811 Vitreous degeneration, right eye: Secondary | ICD-10-CM | POA: Diagnosis not present

## 2017-11-02 DIAGNOSIS — H21301 Idiopathic cysts of iris, ciliary body or anterior chamber, right eye: Secondary | ICD-10-CM | POA: Diagnosis not present

## 2017-11-02 DIAGNOSIS — H25812 Combined forms of age-related cataract, left eye: Secondary | ICD-10-CM | POA: Diagnosis not present

## 2017-11-02 DIAGNOSIS — Z79899 Other long term (current) drug therapy: Secondary | ICD-10-CM | POA: Diagnosis not present

## 2017-11-02 DIAGNOSIS — Z888 Allergy status to other drugs, medicaments and biological substances status: Secondary | ICD-10-CM | POA: Diagnosis not present

## 2017-11-02 NOTE — Telephone Encounter (Signed)
Infusion Orders placed.

## 2017-11-02 NOTE — Telephone Encounter (Signed)
A representative from [facility] called to get the Orencia orders for patient's appointment on 11/05/17 at 10:00 am.

## 2017-11-05 ENCOUNTER — Encounter (HOSPITAL_COMMUNITY)
Admission: RE | Admit: 2017-11-05 | Discharge: 2017-11-05 | Disposition: A | Discharge status: Home / Self Care | Payer: Medicare Other | Source: Ambulatory Visit | Attending: Rheumatology | Admitting: Rheumatology

## 2017-11-05 DIAGNOSIS — M0579 Rheumatoid arthritis with rheumatoid factor of multiple sites without organ or systems involvement: Secondary | ICD-10-CM | POA: Insufficient documentation

## 2017-11-05 MED ORDER — SODIUM CHLORIDE 0.9 % IV SOLN
1000.0000 mg | INTRAVENOUS | Status: DC
  Administered 2017-11-05: 1000 mg via INTRAVENOUS
  Filled 2017-11-05: qty 40

## 2017-11-05 MED ORDER — ACETAMINOPHEN 325 MG PO TABS: 650.0000 mg | ORAL_TABLET | Freq: Once | ORAL | Status: DC

## 2017-11-05 MED ORDER — DIPHENHYDRAMINE HCL 25 MG PO CAPS: 25.0000 mg | ORAL_CAPSULE | Freq: Once | ORAL | Status: DC

## 2017-11-05 MED ORDER — SODIUM CHLORIDE 0.9 % IV SOLN
Freq: Once | INTRAVENOUS | Status: AC
  Administered 2017-11-05: 250 mL via INTRAVENOUS

## 2017-11-05 NOTE — Progress Notes (Signed)
Orencia here. Will infuse.

## 2017-11-05 NOTE — Progress Notes (Signed)
Pt states she took her tylenol and benadryl at home before coming to hospital. 

## 2017-11-05 NOTE — Progress Notes (Signed)
Brett Canales from pharmacy called and stated that he was waiting on med from [facility]. Med dose not available here. Pt notified. Voiced understanding.

## 2017-11-30 ENCOUNTER — Other Ambulatory Visit: Payer: Self-pay | Admitting: *Deleted

## 2017-11-30 DIAGNOSIS — M0579 Rheumatoid arthritis with rheumatoid factor of multiple sites without organ or systems involvement: Secondary | ICD-10-CM

## 2017-12-03 ENCOUNTER — Encounter (HOSPITAL_COMMUNITY): Payer: Self-pay

## 2017-12-03 ENCOUNTER — Encounter (HOSPITAL_COMMUNITY)
Admission: RE | Admit: 2017-12-03 | Discharge: 2017-12-03 | Disposition: A | Discharge status: Home / Self Care | Payer: Medicare Other | Source: Ambulatory Visit | Attending: Rheumatology | Admitting: Rheumatology

## 2017-12-03 DIAGNOSIS — M0579 Rheumatoid arthritis with rheumatoid factor of multiple sites without organ or systems involvement: Secondary | ICD-10-CM | POA: Diagnosis not present

## 2017-12-03 LAB — CBC
HEMATOCRIT: 39.5 % (ref 36.0–46.0)
HEMOGLOBIN: 12.7 g/dL (ref 12.0–15.0)
MCH: 31.7 pg (ref 26.0–34.0)
MCHC: 32.2 g/dL (ref 30.0–36.0)
MCV: 98.5 fL (ref 78.0–100.0)
Platelets: 307 10*3/uL (ref 150–400)
RBC: 4.01 MIL/uL (ref 3.87–5.11)
RDW: 13.3 % (ref 11.5–15.5)
WBC: 6.8 10*3/uL (ref 4.0–10.5)

## 2017-12-03 LAB — COMPREHENSIVE METABOLIC PANEL
ALBUMIN: 3.8 g/dL (ref 3.5–5.0)
ALK PHOS: 68 U/L (ref 38–126)
ALT: 10 U/L (ref 0–44)
AST: 15 U/L (ref 15–41)
Anion gap: 6 (ref 5–15)
BILIRUBIN TOTAL: 0.7 mg/dL (ref 0.3–1.2)
BUN: 13 mg/dL (ref 8–23)
CALCIUM: 9.1 mg/dL (ref 8.9–10.3)
CO2: 29 mmol/L (ref 22–32)
Chloride: 106 mmol/L (ref 98–111)
Creatinine, Ser: 0.69 mg/dL (ref 0.44–1.00)
GFR calc Af Amer: >60 mL/min (ref >60)
GLUCOSE: 97 mg/dL (ref 70–99)
POTASSIUM: 3.8 mmol/L (ref 3.5–5.1)
Sodium: 141 mmol/L (ref 135–145)
TOTAL PROTEIN: 6.9 g/dL (ref 6.5–8.1)

## 2017-12-03 MED ORDER — DIPHENHYDRAMINE HCL 25 MG PO CAPS: 25.0000 mg | ORAL_CAPSULE | ORAL | Status: DC

## 2017-12-03 MED ORDER — SODIUM CHLORIDE 0.9 % IV SOLN
1000.0000 mg | INTRAVENOUS | Status: DC
  Administered 2017-12-03: 1000 mg via INTRAVENOUS
  Filled 2017-12-03: qty 40

## 2017-12-03 MED ORDER — ACETAMINOPHEN 325 MG PO TABS: 650.0000 mg | ORAL_TABLET | ORAL | Status: DC

## 2017-12-27 ENCOUNTER — Other Ambulatory Visit: Payer: Self-pay | Admitting: *Deleted

## 2017-12-27 DIAGNOSIS — M0579 Rheumatoid arthritis with rheumatoid factor of multiple sites without organ or systems involvement: Secondary | ICD-10-CM

## 2017-12-31 ENCOUNTER — Encounter (HOSPITAL_COMMUNITY)
Admission: RE | Admit: 2017-12-31 | Discharge: 2017-12-31 | Disposition: A | Discharge status: Home / Self Care | Payer: Medicare Other | Source: Ambulatory Visit | Attending: Rheumatology | Admitting: Rheumatology

## 2017-12-31 DIAGNOSIS — M0579 Rheumatoid arthritis with rheumatoid factor of multiple sites without organ or systems involvement: Secondary | ICD-10-CM | POA: Diagnosis not present

## 2017-12-31 MED ORDER — ACETAMINOPHEN 325 MG PO TABS: 650.0000 mg | ORAL_TABLET | ORAL | Status: DC

## 2017-12-31 MED ORDER — SODIUM CHLORIDE 0.9 % IV SOLN
Freq: Once | INTRAVENOUS | Status: AC
  Administered 2017-12-31: 11:00:00 via INTRAVENOUS

## 2017-12-31 MED ORDER — DIPHENHYDRAMINE HCL 25 MG PO CAPS: 25.0000 mg | ORAL_CAPSULE | ORAL | Status: DC

## 2017-12-31 MED ORDER — SODIUM CHLORIDE 0.9 % IV SOLN
1000.0000 mg | INTRAVENOUS | Status: DC
  Administered 2017-12-31: 1000 mg via INTRAVENOUS
  Filled 2017-12-31: qty 40

## 2018-01-01 ENCOUNTER — Other Ambulatory Visit: Payer: Self-pay | Admitting: *Deleted

## 2018-01-01 DIAGNOSIS — M0579 Rheumatoid arthritis with rheumatoid factor of multiple sites without organ or systems involvement: Secondary | ICD-10-CM

## 2018-01-05 ENCOUNTER — Other Ambulatory Visit: Payer: Self-pay | Admitting: Rheumatology

## 2018-01-08 NOTE — Telephone Encounter (Signed)
Last Visit: 10/31/17 Next Visit: 04/10/18 Labs: 12/03/17 WNL PLQ eye exam: 08/14/2017 normal  Okay to refill per Dr. Deveshwar 

## 2018-01-28 ENCOUNTER — Encounter (HOSPITAL_COMMUNITY)
Admission: RE | Admit: 2018-01-28 | Discharge: 2018-01-28 | Disposition: A | Discharge status: Home / Self Care | Payer: Medicare Other | Source: Ambulatory Visit | Attending: Rheumatology | Admitting: Rheumatology

## 2018-01-28 ENCOUNTER — Encounter (HOSPITAL_COMMUNITY): Payer: Self-pay

## 2018-01-28 DIAGNOSIS — M0579 Rheumatoid arthritis with rheumatoid factor of multiple sites without organ or systems involvement: Secondary | ICD-10-CM | POA: Diagnosis not present

## 2018-01-28 LAB — COMPREHENSIVE METABOLIC PANEL
ALK PHOS: 67 U/L (ref 38–126)
ALT: 13 U/L (ref 0–44)
AST: 18 U/L (ref 15–41)
Albumin: 3.8 g/dL (ref 3.5–5.0)
Anion gap: 9 (ref 5–15)
BILIRUBIN TOTAL: 0.7 mg/dL (ref 0.3–1.2)
BUN: 13 mg/dL (ref 8–23)
CALCIUM: 9 mg/dL (ref 8.9–10.3)
CHLORIDE: 102 mmol/L (ref 98–111)
CO2: 27 mmol/L (ref 22–32)
CREATININE: 0.75 mg/dL (ref 0.44–1.00)
GFR calc Af Amer: >60 mL/min (ref >60)
Glucose, Bld: 90 mg/dL (ref 70–99)
Potassium: 3.8 mmol/L (ref 3.5–5.1)
Sodium: 138 mmol/L (ref 135–145)
Total Protein: 6.8 g/dL (ref 6.5–8.1)

## 2018-01-28 LAB — CBC
HEMATOCRIT: 40.6 % (ref 36.0–46.0)
HEMOGLOBIN: 13.4 g/dL (ref 12.0–15.0)
MCH: 32.3 pg (ref 26.0–34.0)
MCHC: 33 g/dL (ref 30.0–36.0)
MCV: 97.8 fL (ref 78.0–100.0)
PLATELETS: 303 10*3/uL (ref 150–400)
RBC: 4.15 MIL/uL (ref 3.87–5.11)
RDW: 13.2 % (ref 11.5–15.5)
WBC: 6.5 10*3/uL (ref 4.0–10.5)

## 2018-01-28 MED ORDER — SODIUM CHLORIDE 0.9 % IV SOLN
1000.0000 mg | INTRAVENOUS | Status: DC
  Administered 2018-01-28: 1000 mg via INTRAVENOUS
  Filled 2018-01-28: qty 40

## 2018-01-28 MED ORDER — ACETAMINOPHEN 325 MG PO TABS: 650.0000 mg | ORAL_TABLET | ORAL | Status: DC

## 2018-01-28 MED ORDER — DIPHENHYDRAMINE HCL 25 MG PO CAPS: 25.0000 mg | ORAL_CAPSULE | ORAL | Status: DC

## 2018-01-28 MED ORDER — SODIUM CHLORIDE 0.9 % IV SOLN
Freq: Once | INTRAVENOUS | Status: AC
  Administered 2018-01-28: 10:00:00 via INTRAVENOUS

## 2018-02-12 ENCOUNTER — Encounter: Payer: Self-pay | Admitting: Internal Medicine

## 2018-02-12 ENCOUNTER — Ambulatory Visit (INDEPENDENT_AMBULATORY_CARE_PROVIDER_SITE_OTHER): Payer: Medicare Other | Admitting: Internal Medicine

## 2018-02-12 DIAGNOSIS — M0579 Rheumatoid arthritis with rheumatoid factor of multiple sites without organ or systems involvement: Secondary | ICD-10-CM | POA: Diagnosis not present

## 2018-02-12 DIAGNOSIS — Z23 Encounter for immunization: Secondary | ICD-10-CM

## 2018-02-12 DIAGNOSIS — J4530 Mild persistent asthma, uncomplicated: Secondary | ICD-10-CM | POA: Diagnosis not present

## 2018-02-12 MED ORDER — FLUTICASONE-UMECLIDIN-VILANT 100-62.5-25 MCG/INH IN AEPB: 1.0000 | INHALATION_SPRAY | Freq: Every day | RESPIRATORY_TRACT | 11 refills | Status: DC

## 2018-02-12 NOTE — Assessment & Plan Note (Signed)
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

## 2018-02-12 NOTE — Progress Notes (Signed)
HPI female never smoker followed for asthma, allergic rhinitis, complicated by rheumatoid arthritis PFT 02/02/09-mild obstruction small airways, minimal response bronchodilator, DLCO mildly reduced. FVC 3.36/116%, FEV1 2.48/119%, ratio 0.74, FEF 25-75% 1.80/75%, TLC 108%, DLCO 70% Office Spirometry 09/08/16-WNL-FVC 2.63/98%, FEV1 1.91/95%, ratio 0.73, FEF 25-75% 1.32/82% FENO 02/12/17- 32H -------------------------------------------------------------------------------------  06/15/17- 77 year old female never smoker followed for asthma, chronic cough, allergic rhinitis, complicated by rheumatoid arthritis Asthma: Pt states she is doing well with Trelegy inhaler.  She feels very much better, partly because Trelegy stop her cough or other inhalers did not, and partly because she is on an effective regimen now for her rheumatoid arthritis.  Denies active need for rescue inhaler, sleep disturbance or frequent wheezing.  02/12/2018- 77 year old female never smoker followed for asthma, chronic cough, allergic rhinitis, complicated by rheumatoid arthritis -----Asthma: Pt states she has been feeling great overall since using Trelegy. Has only used Rescue inhaler 2 times this summer.  Trelegy, Ventolin HFA, She remains very pleased with Trelegy and has only needed her rescue inhaler on rare occasion.  Discussed flu vaccine.  No acute issues.  ROS-see HPI    + = positive Constitutional:   No-   weight loss, night sweats, fevers, chills, fatigue, lassitude. HEENT:   No-  headaches, difficulty swallowing, tooth/dental problems, sore throat,       No-  sneezing, itching, ear ache, nasal congestion, post nasal drip,  CV:  No-   chest pain, orthopnea, PND, +swelling in lower extremities, anasarca, dizziness, palpitations Resp:   + shortness of breath with exertion or at rest.              No-   productive cough,   non-productive cough,  No- coughing up of blood.              No-   change in color of mucus.  +rare  wheezing.   Skin: No-   rash or lesions. GI:  No-   heartburn, indigestion, abdominal pain, nausea, vomiting,  GU:  MS: + joint pain or swelling.   Neuro-     nothing unusual Psych:  No- change in mood or affect. No depression or anxiety.  No memory loss.  OBJ        General- Alert, Oriented, Affect-appropriate, Distress- none acute. + Morbidly obese Skin- rash-none, lesions- none, excoriation- none Lymphadenopathy- none Head- atraumatic            Eyes- Gross vision intact, PERRLA, conjunctivae clear secretions            Ears- Hearing, canals-normal            Nose- Clear, no-Septal dev, mucus, polyps, erosion, perforation             Throat- Mallampati III , mucosa clear , drainage- none, tonsils- atrophic. +Easy gag. Neck- flexible , trachea midline, no stridor , thyroid nl, carotid no bruit Chest - symmetrical excursion , unlabored           Heart/CV- RRR , no murmur , no gallop  , no rub, nl s1 s2                           - JVD- none , edema- none, stasis changes- none, varices- none           Lung- clear, wheeze- none, cough- none , dullness-none, rub- none           Chest wall-  Abd-  Br/ Gen/  Rectal- Not done, not indicated Extrem- cyanosis- none, clubbing, none, atrophy- none, strength- nl Neuro- grossly intact to observation

## 2018-02-12 NOTE — Patient Instructions (Addendum)
Script printed refilling Trelegy  Ok to call us for refills as needed  Order- flu vax senior  You had the Prevnar-13 pneumonia vaccine here in 2014. You can ask your PCP if the [place] records show that you have ever had the Pneumovax-23 vaccine.

## 2018-02-12 NOTE — Assessment & Plan Note (Signed)
Better control now with Trelegy.  She is pleased with her status.  Exam is good. Flu vaccine-senior

## 2018-02-13 ENCOUNTER — Other Ambulatory Visit: Payer: Self-pay | Admitting: *Deleted

## 2018-02-13 DIAGNOSIS — M0579 Rheumatoid arthritis with rheumatoid factor of multiple sites without organ or systems involvement: Secondary | ICD-10-CM

## 2018-02-15 ENCOUNTER — Other Ambulatory Visit: Payer: Self-pay | Admitting: Internal Medicine

## 2018-02-18 ENCOUNTER — Encounter (HOSPITAL_COMMUNITY): Payer: Medicare Other

## 2018-02-25 ENCOUNTER — Encounter (HOSPITAL_COMMUNITY)
Admission: RE | Admit: 2018-02-25 | Discharge: 2018-02-25 | Disposition: A | Discharge status: Home / Self Care | Payer: Medicare Other | Source: Ambulatory Visit | Attending: Rheumatology | Admitting: Rheumatology

## 2018-02-25 ENCOUNTER — Encounter (HOSPITAL_COMMUNITY): Payer: Self-pay

## 2018-02-25 DIAGNOSIS — M0579 Rheumatoid arthritis with rheumatoid factor of multiple sites without organ or systems involvement: Secondary | ICD-10-CM | POA: Insufficient documentation

## 2018-02-25 MED ORDER — DIPHENHYDRAMINE HCL 25 MG PO CAPS: 25.0000 mg | ORAL_CAPSULE | ORAL | Status: DC

## 2018-02-25 MED ORDER — SODIUM CHLORIDE 0.9 % IV SOLN
Freq: Once | INTRAVENOUS | Status: AC
  Administered 2018-02-25: 10:00:00 via INTRAVENOUS

## 2018-02-25 MED ORDER — ACETAMINOPHEN 325 MG PO TABS: 650.0000 mg | ORAL_TABLET | ORAL | Status: DC

## 2018-02-25 MED ORDER — SODIUM CHLORIDE 0.9 % IV SOLN
1000.0000 mg | INTRAVENOUS | Status: DC
  Administered 2018-02-25: 1000 mg via INTRAVENOUS
  Filled 2018-02-25: qty 40

## 2018-03-22 ENCOUNTER — Other Ambulatory Visit: Payer: Self-pay | Admitting: *Deleted

## 2018-03-22 NOTE — Progress Notes (Signed)
Infusion orders are current for patient CBC CMP Tylenol Benadryl appointments are up to date and follow up appointment  is scheduled TB gold not due yet.  

## 2018-03-25 ENCOUNTER — Encounter (HOSPITAL_COMMUNITY)
Admission: RE | Admit: 2018-03-25 | Discharge: 2018-03-25 | Disposition: A | Discharge status: Home / Self Care | Payer: Medicare Other | Source: Ambulatory Visit | Attending: Rheumatology | Admitting: Rheumatology

## 2018-03-25 ENCOUNTER — Encounter (HOSPITAL_COMMUNITY): Payer: Self-pay

## 2018-03-25 DIAGNOSIS — M0579 Rheumatoid arthritis with rheumatoid factor of multiple sites without organ or systems involvement: Secondary | ICD-10-CM | POA: Diagnosis not present

## 2018-03-25 LAB — CBC
HCT: 39.2 % (ref 36.0–46.0)
Hemoglobin: 13 g/dL (ref 12.0–15.0)
MCH: 32.2 pg (ref 26.0–34.0)
MCHC: 33.2 g/dL (ref 30.0–36.0)
MCV: 97 fL (ref 80.0–100.0)
PLATELETS: 317 10*3/uL (ref 150–400)
RBC: 4.04 MIL/uL (ref 3.87–5.11)
RDW: 12.7 % (ref 11.5–15.5)
WBC: 7.1 10*3/uL (ref 4.0–10.5)
nRBC: 0 % (ref 0.0–0.2)

## 2018-03-25 LAB — COMPREHENSIVE METABOLIC PANEL
ALT: 10 U/L (ref 0–44)
AST: 16 U/L (ref 15–41)
Albumin: 3.7 g/dL (ref 3.5–5.0)
Alkaline Phosphatase: 65 U/L (ref 38–126)
Anion gap: 9 (ref 5–15)
BUN: 15 mg/dL (ref 8–23)
CHLORIDE: 105 mmol/L (ref 98–111)
CO2: 24 mmol/L (ref 22–32)
CREATININE: 0.67 mg/dL (ref 0.44–1.00)
Calcium: 8.8 mg/dL — ABNORMAL LOW (ref 8.9–10.3)
GFR calc non Af Amer: >60 mL/min (ref >60)
Glucose, Bld: 107 mg/dL — ABNORMAL HIGH (ref 70–99)
POTASSIUM: 3.3 mmol/L — AB (ref 3.5–5.1)
Sodium: 138 mmol/L (ref 135–145)
TOTAL PROTEIN: 7.1 g/dL (ref 6.5–8.1)
Total Bilirubin: 0.9 mg/dL (ref 0.3–1.2)

## 2018-03-25 MED ORDER — SODIUM CHLORIDE 0.9 % IV SOLN
1000.0000 mg | Freq: Once | INTRAVENOUS | Status: AC
  Administered 2018-03-25: 1000 mg via INTRAVENOUS
  Filled 2018-03-25: qty 40

## 2018-03-25 MED ORDER — ACETAMINOPHEN 325 MG PO TABS: 650.0000 mg | ORAL_TABLET | Freq: Once | ORAL | Status: DC

## 2018-03-25 MED ORDER — DIPHENHYDRAMINE HCL 25 MG PO CAPS: 25.0000 mg | ORAL_CAPSULE | Freq: Once | ORAL | Status: DC

## 2018-03-25 MED ORDER — SODIUM CHLORIDE 0.9 % IV SOLN
Freq: Once | INTRAVENOUS | Status: AC
  Administered 2018-03-25: 10:00:00 via INTRAVENOUS

## 2018-03-25 NOTE — Progress Notes (Signed)
K is low. Please notify patient and fax results to her PCP.

## 2018-03-26 ENCOUNTER — Encounter (HOSPITAL_COMMUNITY): Payer: Medicare Other

## 2018-03-27 NOTE — Progress Notes (Signed)
Office Visit Note  Patient: Monica [name]             Date of Birth: 1940/05/24           MRN: [id number]             PCP: Juluis Rainier, MD Referring: Juluis Rainier, MD Visit Date: 04/10/2018 Occupation: @GUAROCC @  Subjective:  Joint stiffness.   History of Present Illness: Monica [name] is a 77 y.o. female with history of seropositive rheumatoid arthritis and osteoarthritis.  Has been having increased pain and discomfort in her right knee joint.  She denies any joint swelling.  She has been tolerating IV Orencia and Plaquenil without any issues.  Her right shoulder joint bursitis has improved.  Activities of Daily Living:  Patient reports morning stiffness for 30 minutes.   Patient Denies nocturnal pain.  Difficulty dressing/grooming: Denies Difficulty climbing stairs: Reports Difficulty getting out of chair: Reports Difficulty using hands for taps, buttons, cutlery, and/or writing: Denies  Review of Systems  Constitutional: Positive for fatigue. Negative for night sweats, weight gain and weight loss.  HENT: Positive for mouth dryness. Negative for mouth sores, trouble swallowing, trouble swallowing and nose dryness.   Eyes: Positive for dryness. Negative for pain, redness and visual disturbance.  Respiratory: Negative for cough, shortness of breath and difficulty breathing.   Cardiovascular: Negative for chest pain, palpitations, hypertension, irregular heartbeat and swelling in legs/feet.  Gastrointestinal: Negative for blood in stool, constipation and diarrhea.  Endocrine: Negative for increased urination.  Genitourinary: Negative for vaginal dryness.  Musculoskeletal: Positive for arthralgias, joint pain and morning stiffness. Negative for joint swelling, myalgias, muscle weakness, muscle tenderness and myalgias.  Skin: Negative for color change, rash, hair loss, skin tightness, ulcers and sensitivity to sunlight.  Allergic/Immunologic: Negative for susceptible  to infections.  Neurological: Negative for dizziness, memory loss, night sweats and weakness.  Hematological: Negative for swollen glands.  Psychiatric/Behavioral: Positive for depressed mood and sleep disturbance. The patient is not nervous/anxious.     PMFS History:  Patient Active Problem List   Diagnosis Date Noted  . Osteopenia 12/20/2016  . History of depression 12/11/2016  . Asthma, mild persistent 09/08/2016  . High risk medication use 05/15/2016  . Primary osteoarthritis of both hands 05/15/2016  . Primary osteoarthritis of both feet 05/15/2016  . Primary osteoarthritis of both knees 05/15/2016  . Vitamin D deficiency 05/15/2016  . History of hypertension 05/15/2016  . History of hyperlipidemia 05/15/2016  . Rheumatoid arthritis with rheumatoid factor of multiple sites without organ or systems involvement (HCC) 01/10/2016  . Seasonal allergic rhinitis 10/03/2011  . HYPERLIPIDEMIA 12/31/2008  . HYPERTENSION 12/31/2008  . Mild persistent asthmatic bronchitis with exacerbation 12/31/2008    Past Medical History:  Diagnosis Date  . Arthritis   . 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 Hyperlipidemia   . Hypertension     Family History  Problem Relation Age of Onset  . Lung cancer Father   . Alzheimer's disease Mother    Past Surgical History:  Procedure Laterality Date  . CARPAL TUNNEL RELEASE    . FOOT SURGERY     Social History   Social History Narrative  . Not on file    Objective: Vital Signs: BP 134/76 (BP Location: Left Arm, Patient Position: Sitting, Cuff Size: Large)   Pulse 82   Resp 14   Ht 5' 2.75" (1.594 m)   Wt 236 lb 9.6 oz (107.3 kg)  BMI 42.25 kg/m    Physical Exam  Constitutional: She is oriented to person, place, and time. She appears well-developed and well-nourished.  HENT:  Head: Normocephalic and atraumatic.  Eyes: Conjunctivae and EOM are normal.  Neck: Normal range of motion.  Cardiovascular: Normal  rate, regular rhythm, normal heart sounds and intact distal pulses.  Pulmonary/Chest: Effort normal and breath sounds normal.  Abdominal: Soft. Bowel sounds are normal.  Lymphadenopathy:    She has no cervical adenopathy.  Neurological: She is alert and oriented to person, place, and time.  Skin: Skin is warm and dry. Capillary refill takes less than 2 seconds.  Psychiatric: She has a normal mood and affect. Her behavior is normal.  Nursing note and vitals reviewed.    Musculoskeletal Exam: C-spine thoracic spine good range of motion.  She has some discomfort range of motion of her lumbar spine.  Shoulder joints elbow joints wrist joints were in good range of motion.  She synovial thickening over MCP joints but no synovitis.  She has PIP and DIP thickening with some inflammation in her left fifth PIP joint.  Hip joints knee joints ankles MTPs PIPs were in good range of motion.  She is some crepitus in her knee joints.  No warmth swelling or effusion was noted.  CDAI Exam: CDAI Score: 0.5  Patient Global Assessment: 3 (mm); Provider Global Assessment: 2 (mm) Swollen: 0 ; Tender: 0  Joint Exam   Not documented   There is currently no information documented on the homunculus. Go to the Rheumatology activity and complete the homunculus joint exam.  Investigation: No additional findings.  Imaging: No results found.  Recent Labs: Lab Results  Component Value Date   WBC 7.1 03/25/2018   HGB 13.0 03/25/2018   PLT 317 03/25/2018   NA 138 03/25/2018   K 3.3 (L) 03/25/2018   CL 105 03/25/2018   CO2 24 03/25/2018   GLUCOSE 107 (H) 03/25/2018   BUN 15 03/25/2018   CREATININE 0.67 03/25/2018   BILITOT 0.9 03/25/2018   ALKPHOS 65 03/25/2018   AST 16 03/25/2018   ALT 10 03/25/2018   PROT 7.1 03/25/2018   ALBUMIN 3.7 03/25/2018   CALCIUM 8.8 (L) 03/25/2018   GFRAA >60 03/25/2018   QFTBGOLD Negative 07/24/2016   QFTBGOLDPLUS NEGATIVE 06/15/2017    Speciality Comments: PLQ eye  exam: 08/14/2017 normal. [facility]. Follow up in 1 year. Orencia 1000 mg / started on 09/2016 has  Prior therapy includes: Simponi Aria (inadequate response)    Procedures:  No procedures performed Allergies: Arava [leflunomide] and Piroxicam   Assessment / Plan:     Visit Diagnoses: Rheumatoid arthritis with rheumatoid factor of multiple sites without organ or systems involvement (HCC) - +CCP.  Patient has been doing well with IV Orencia and Plaquenil combination.  She has no active synovitis on examination.  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  I offered cortisone injection but she declined.  Her labs have been stable.  High risk medication use - Current regimen includes Orencia IV 1000 mg every 28 days (last infusion 03/25/18) and Plaquenil 200 mg in the morning and 100 mg at bedtime.  Last TB: Negative on 06/15/2017.  Last Plaquenil eye exam normal on 08/14/2017.  Most recent CBC/CMP within normal limits on 03/25/2018.  Next CBC/CMP due with next infusion on 04/22/2018.  Recommend Pneumovax 23 and Shingrix vaccine.  ( has had inadequate response to [employer],  Reaction to Arava, Methotrexate caused oral ulcers ).  She will get TB gold in February 2020.  Primary osteoarthritis of both hands-joint protection muscle strengthening was discussed.  Primary osteoarthritis of both knees-she continues to have pain and discomfort in her knee joints especially in her right knee joint.  Weight loss diet and exercise was discussed.  Primary osteoarthritis of both feet-proper fitting shoes were discussed.  Osteopenia of multiple sites-Currently on calcium and vitamin D supplementation.  Last DEXA on 11/16/2016.  She will need repeat DEXA in July 2020.  History of vitamin D deficiency-she is on vitamin D supplement.  History of asthma  History of hyperlipidemia  History of hypertension-blood pressure is mildly  elevated.  She has been followed by her PCP.  History of depression -she is currently on medications.  Orders: No orders of the defined types were placed in this encounter.  No orders of the defined types were placed in this encounter.   Face-to-face time spent with patient was 30 minutes. Greater than 50% of time was spent in counseling and coordination of care.  Follow-Up Instructions: Return in about 5 months (around 09/09/2018) for Rheumatoid arthritis, Osteoarthritis.   Pollyann Savoy, MD  Note - This record has been created using Animal nutritionist.  Chart creation errors have been sought, but may not always  have been located. Such creation errors do not reflect on  the standard of medical care.

## 2018-04-09 ENCOUNTER — Other Ambulatory Visit: Payer: Self-pay | Admitting: Rheumatology

## 2018-04-09 NOTE — Telephone Encounter (Signed)
Last Visit: 10/31/17 Next Visit: 04/10/18 Labs: 12/03/17 WNL PLQ eye exam: 08/14/2017 normal  Okay to refill per Dr. Corliss Skains

## 2018-04-10 ENCOUNTER — Ambulatory Visit (INDEPENDENT_AMBULATORY_CARE_PROVIDER_SITE_OTHER): Payer: Medicare Other | Admitting: Rheumatology

## 2018-04-10 ENCOUNTER — Encounter: Payer: Self-pay | Admitting: Rheumatology

## 2018-04-10 VITALS — BP 134/76 | HR 82 | Resp 14 | Ht 62.75 in | Wt 236.6 lb

## 2018-04-10 DIAGNOSIS — Z8709 Personal history of other diseases of the respiratory system: Secondary | ICD-10-CM

## 2018-04-10 DIAGNOSIS — M19042 Primary osteoarthritis, left hand: Secondary | ICD-10-CM | POA: Diagnosis not present

## 2018-04-10 DIAGNOSIS — Z8679 Personal history of other diseases of the circulatory system: Secondary | ICD-10-CM

## 2018-04-10 DIAGNOSIS — M19071 Primary osteoarthritis, right ankle and foot: Secondary | ICD-10-CM

## 2018-04-10 DIAGNOSIS — M0579 Rheumatoid arthritis with rheumatoid factor of multiple sites without organ or systems involvement: Secondary | ICD-10-CM

## 2018-04-10 DIAGNOSIS — M19041 Primary osteoarthritis, right hand: Secondary | ICD-10-CM | POA: Diagnosis not present

## 2018-04-10 DIAGNOSIS — M19072 Primary osteoarthritis, left ankle and foot: Secondary | ICD-10-CM | POA: Diagnosis not present

## 2018-04-10 DIAGNOSIS — Z79899 Other long term (current) drug therapy: Secondary | ICD-10-CM | POA: Diagnosis not present

## 2018-04-10 DIAGNOSIS — Z8639 Personal history of other endocrine, nutritional and metabolic disease: Secondary | ICD-10-CM

## 2018-04-10 DIAGNOSIS — M8589 Other specified disorders of bone density and structure, multiple sites: Secondary | ICD-10-CM | POA: Diagnosis not present

## 2018-04-10 DIAGNOSIS — Z8659 Personal history of other mental and behavioral disorders: Secondary | ICD-10-CM | POA: Diagnosis not present

## 2018-04-10 DIAGNOSIS — M170 Bilateral primary osteoarthritis of knee: Secondary | ICD-10-CM

## 2018-04-16 ENCOUNTER — Other Ambulatory Visit: Payer: Self-pay | Admitting: Rheumatology

## 2018-04-16 DIAGNOSIS — F419 Anxiety disorder, unspecified: Secondary | ICD-10-CM | POA: Diagnosis not present

## 2018-04-16 DIAGNOSIS — J45909 Unspecified asthma, uncomplicated: Secondary | ICD-10-CM | POA: Diagnosis not present

## 2018-04-16 DIAGNOSIS — E7800 Pure hypercholesterolemia, unspecified: Secondary | ICD-10-CM | POA: Diagnosis not present

## 2018-04-16 DIAGNOSIS — R7301 Impaired fasting glucose: Secondary | ICD-10-CM | POA: Diagnosis not present

## 2018-04-16 DIAGNOSIS — I10 Essential (primary) hypertension: Secondary | ICD-10-CM | POA: Diagnosis not present

## 2018-04-16 DIAGNOSIS — M069 Rheumatoid arthritis, unspecified: Secondary | ICD-10-CM | POA: Diagnosis not present

## 2018-04-16 DIAGNOSIS — F339 Major depressive disorder, recurrent, unspecified: Secondary | ICD-10-CM | POA: Diagnosis not present

## 2018-04-16 DIAGNOSIS — Z6841 Body Mass Index (BMI) 40.0 and over, adult: Secondary | ICD-10-CM | POA: Diagnosis not present

## 2018-04-16 NOTE — Telephone Encounter (Signed)
Last Visit: 04/10/18 Next visit: 09/25/18  Okay to refill per Dr. Corliss Skains

## 2018-04-19 ENCOUNTER — Other Ambulatory Visit: Payer: Self-pay | Admitting: *Deleted

## 2018-04-19 DIAGNOSIS — M0579 Rheumatoid arthritis with rheumatoid factor of multiple sites without organ or systems involvement: Secondary | ICD-10-CM

## 2018-04-22 ENCOUNTER — Encounter (HOSPITAL_COMMUNITY)
Admission: RE | Admit: 2018-04-22 | Discharge: 2018-04-22 | Disposition: A | Discharge status: Home / Self Care | Payer: Medicare Other | Source: Ambulatory Visit | Attending: Rheumatology | Admitting: Rheumatology

## 2018-04-22 DIAGNOSIS — M0579 Rheumatoid arthritis with rheumatoid factor of multiple sites without organ or systems involvement: Secondary | ICD-10-CM

## 2018-04-22 MED ORDER — SODIUM CHLORIDE 0.9 % IV SOLN
1000.0000 mg | Freq: Once | INTRAVENOUS | Status: AC
  Administered 2018-04-22: 1000 mg via INTRAVENOUS
  Filled 2018-04-22: qty 40

## 2018-04-22 MED ORDER — SODIUM CHLORIDE 0.9 % IV SOLN
Freq: Once | INTRAVENOUS | Status: AC
  Administered 2018-04-22: 250 mL via INTRAVENOUS

## 2018-04-23 ENCOUNTER — Encounter (HOSPITAL_COMMUNITY): Payer: Medicare Other

## 2018-04-24 ENCOUNTER — Telehealth: Payer: Self-pay | Admitting: Pharmacy Technician

## 2018-04-24 NOTE — Telephone Encounter (Signed)
Called patient to see if she is expecting any insurance changes for 2020. Patient is receiving infusions that may require a pre-certification. No answer or voicemail.  11:09 AM Eulises Kijowski Johnney Ou CPhT

## 2018-05-17 ENCOUNTER — Other Ambulatory Visit: Payer: Self-pay | Admitting: *Deleted

## 2018-05-17 NOTE — Progress Notes (Signed)
Infusion orders are current for patient CBC CMP Tylenol Benadryl appointments are up to date and follow up appointment  is scheduled TB gold not due yet.  

## 2018-05-20 ENCOUNTER — Encounter (HOSPITAL_COMMUNITY)
Admission: RE | Admit: 2018-05-20 | Discharge: 2018-05-20 | Disposition: A | Discharge status: Home / Self Care | Payer: Medicare Other | Source: Ambulatory Visit | Attending: Rheumatology | Admitting: Rheumatology

## 2018-05-20 ENCOUNTER — Encounter (HOSPITAL_COMMUNITY): Payer: Self-pay

## 2018-05-20 DIAGNOSIS — M0579 Rheumatoid arthritis with rheumatoid factor of multiple sites without organ or systems involvement: Secondary | ICD-10-CM | POA: Diagnosis not present

## 2018-05-20 LAB — COMPREHENSIVE METABOLIC PANEL
ALT: 12 U/L (ref 0–44)
AST: 18 U/L (ref 15–41)
Albumin: 3.8 g/dL (ref 3.5–5.0)
Alkaline Phosphatase: 60 U/L (ref 38–126)
Anion gap: 7 (ref 5–15)
BILIRUBIN TOTAL: 0.6 mg/dL (ref 0.3–1.2)
BUN: 16 mg/dL (ref 8–23)
CO2: 28 mmol/L (ref 22–32)
Calcium: 8.8 mg/dL — ABNORMAL LOW (ref 8.9–10.3)
Chloride: 103 mmol/L (ref 98–111)
Creatinine, Ser: 0.71 mg/dL (ref 0.44–1.00)
GFR calc Af Amer: >60 mL/min (ref >60)
Glucose, Bld: 93 mg/dL (ref 70–99)
Potassium: 3.7 mmol/L (ref 3.5–5.1)
Sodium: 138 mmol/L (ref 135–145)
Total Protein: 6.6 g/dL (ref 6.5–8.1)

## 2018-05-20 LAB — CBC
HCT: 39.8 % (ref 36.0–46.0)
Hemoglobin: 12.9 g/dL (ref 12.0–15.0)
MCH: 32 pg (ref 26.0–34.0)
MCHC: 32.4 g/dL (ref 30.0–36.0)
MCV: 98.8 fL (ref 80.0–100.0)
Platelets: 310 10*3/uL (ref 150–400)
RBC: 4.03 MIL/uL (ref 3.87–5.11)
RDW: 12.6 % (ref 11.5–15.5)
WBC: 5.8 10*3/uL (ref 4.0–10.5)
nRBC: 0 % (ref 0.0–0.2)

## 2018-05-20 MED ORDER — SODIUM CHLORIDE 0.9 % IV SOLN
INTRAVENOUS | Status: DC
  Administered 2018-05-20: 10:00:00 via INTRAVENOUS

## 2018-05-20 MED ORDER — SODIUM CHLORIDE 0.9 % IV SOLN
1000.0000 mg | INTRAVENOUS | Status: DC
  Administered 2018-05-20: 1000 mg via INTRAVENOUS
  Filled 2018-05-20: qty 40

## 2018-05-20 MED ORDER — ACETAMINOPHEN 325 MG PO TABS: 650.0000 mg | ORAL_TABLET | ORAL | Status: DC

## 2018-05-20 MED ORDER — DIPHENHYDRAMINE HCL 25 MG PO CAPS: 25.0000 mg | ORAL_CAPSULE | ORAL | Status: DC

## 2018-05-20 NOTE — Progress Notes (Signed)
Labs are stable.

## 2018-05-21 ENCOUNTER — Encounter (HOSPITAL_COMMUNITY): Payer: Medicare Other

## 2018-05-21 DIAGNOSIS — E7800 Pure hypercholesterolemia, unspecified: Secondary | ICD-10-CM | POA: Diagnosis not present

## 2018-05-21 DIAGNOSIS — H6123 Impacted cerumen, bilateral: Secondary | ICD-10-CM | POA: Diagnosis not present

## 2018-06-10 ENCOUNTER — Telehealth: Payer: Self-pay | Admitting: Rheumatology

## 2018-06-10 DIAGNOSIS — Z9225 Personal history of immunosupression therapy: Secondary | ICD-10-CM

## 2018-06-10 NOTE — Telephone Encounter (Signed)
Patient states she is going to [facility] to have her TB Gold dran tomorrow. Lab orders released.

## 2018-06-10 NOTE — Telephone Encounter (Signed)
Patient left a voicemail stating she is returning your call and "has information you requested."

## 2018-06-11 DIAGNOSIS — Z9225 Personal history of immunosupression therapy: Secondary | ICD-10-CM | POA: Diagnosis not present

## 2018-06-13 LAB — QUANTIFERON-TB GOLD PLUS
Mitogen-NIL: >10 IU/mL
NIL: 0.05 IU/mL
QUANTIFERON-TB GOLD PLUS: NEGATIVE
TB2-NIL: <0 IU/mL

## 2018-06-13 NOTE — Telephone Encounter (Signed)
TB

## 2018-06-14 ENCOUNTER — Other Ambulatory Visit: Payer: Self-pay | Admitting: *Deleted

## 2018-06-14 NOTE — Progress Notes (Signed)
Infusion orders are current for patient CBC CMP Tylenol Benadryl appointments are up to date and follow up appointment  is scheduled TB gold not due yet.  

## 2018-06-17 ENCOUNTER — Encounter (HOSPITAL_COMMUNITY)
Admission: RE | Admit: 2018-06-17 | Discharge: 2018-06-17 | Disposition: A | Discharge status: Home / Self Care | Payer: Medicare Other | Source: Ambulatory Visit | Attending: Rheumatology | Admitting: Rheumatology

## 2018-06-17 DIAGNOSIS — M0579 Rheumatoid arthritis with rheumatoid factor of multiple sites without organ or systems involvement: Secondary | ICD-10-CM | POA: Diagnosis not present

## 2018-06-17 MED ORDER — DIPHENHYDRAMINE HCL 25 MG PO CAPS: 25.0000 mg | ORAL_CAPSULE | Freq: Once | ORAL | Status: DC

## 2018-06-17 MED ORDER — SODIUM CHLORIDE 0.9 % IV SOLN
Freq: Once | INTRAVENOUS | Status: AC
  Administered 2018-06-17: 10:00:00 via INTRAVENOUS

## 2018-06-17 MED ORDER — SODIUM CHLORIDE 0.9 % IV SOLN
1000.0000 mg | Freq: Once | INTRAVENOUS | Status: AC
  Administered 2018-06-17: 1000 mg via INTRAVENOUS
  Filled 2018-06-17: qty 40

## 2018-06-17 MED ORDER — ACETAMINOPHEN 325 MG PO TABS: 650.0000 mg | ORAL_TABLET | Freq: Once | ORAL | Status: DC

## 2018-07-01 ENCOUNTER — Other Ambulatory Visit: Payer: Self-pay | Admitting: *Deleted

## 2018-07-01 DIAGNOSIS — M0579 Rheumatoid arthritis with rheumatoid factor of multiple sites without organ or systems involvement: Secondary | ICD-10-CM

## 2018-07-04 ENCOUNTER — Other Ambulatory Visit: Payer: Self-pay | Admitting: Rheumatology

## 2018-07-05 NOTE — Telephone Encounter (Signed)
Last Visit: 04/10/18 Next visit: 09/25/18 Labs: 05/20/18 stable PLQ eye exam: 08/14/2017 normal.  Okay to refill per Dr. Corliss Skains

## 2018-07-15 ENCOUNTER — Encounter (HOSPITAL_COMMUNITY): Payer: Self-pay

## 2018-07-15 ENCOUNTER — Encounter (HOSPITAL_COMMUNITY)
Admission: RE | Admit: 2018-07-15 | Discharge: 2018-07-15 | Disposition: A | Discharge status: Home / Self Care | Payer: Medicare Other | Source: Ambulatory Visit | Attending: Rheumatology | Admitting: Rheumatology

## 2018-07-15 ENCOUNTER — Other Ambulatory Visit: Payer: Self-pay

## 2018-07-15 DIAGNOSIS — M0579 Rheumatoid arthritis with rheumatoid factor of multiple sites without organ or systems involvement: Secondary | ICD-10-CM | POA: Diagnosis not present

## 2018-07-15 LAB — COMPREHENSIVE METABOLIC PANEL
ALT: 12 U/L (ref 0–44)
AST: 16 U/L (ref 15–41)
Albumin: 3.8 g/dL (ref 3.5–5.0)
Alkaline Phosphatase: 68 U/L (ref 38–126)
Anion gap: 8 (ref 5–15)
BUN: 14 mg/dL (ref 8–23)
CO2: 26 mmol/L (ref 22–32)
Calcium: 8.9 mg/dL (ref 8.9–10.3)
Chloride: 105 mmol/L (ref 98–111)
Creatinine, Ser: 0.66 mg/dL (ref 0.44–1.00)
GFR calc Af Amer: >60 mL/min (ref >60)
Glucose, Bld: 105 mg/dL — ABNORMAL HIGH (ref 70–99)
Potassium: 3.8 mmol/L (ref 3.5–5.1)
Sodium: 139 mmol/L (ref 135–145)
Total Bilirubin: 0.4 mg/dL (ref 0.3–1.2)
Total Protein: 7 g/dL (ref 6.5–8.1)

## 2018-07-15 LAB — CBC
HCT: 40.9 % (ref 36.0–46.0)
Hemoglobin: 12.9 g/dL (ref 12.0–15.0)
MCH: 31 pg (ref 26.0–34.0)
MCHC: 31.5 g/dL (ref 30.0–36.0)
MCV: 98.3 fL (ref 80.0–100.0)
Platelets: 309 10*3/uL (ref 150–400)
RBC: 4.16 MIL/uL (ref 3.87–5.11)
RDW: 12.6 % (ref 11.5–15.5)
WBC: 6.9 10*3/uL (ref 4.0–10.5)
nRBC: 0 % (ref 0.0–0.2)

## 2018-07-15 MED ORDER — SODIUM CHLORIDE 0.9 % IV SOLN
1000.0000 mg | INTRAVENOUS | Status: DC
  Administered 2018-07-15: 1000 mg via INTRAVENOUS
  Filled 2018-07-15: qty 40

## 2018-07-15 MED ORDER — ACETAMINOPHEN 325 MG PO TABS: 650.0000 mg | ORAL_TABLET | ORAL | Status: DC

## 2018-07-15 MED ORDER — DIPHENHYDRAMINE HCL 25 MG PO CAPS: 25.0000 mg | ORAL_CAPSULE | ORAL | Status: DC

## 2018-07-15 MED ORDER — SODIUM CHLORIDE 0.9 % IV SOLN
Freq: Once | INTRAVENOUS | Status: AC
  Administered 2018-07-15: 11:00:00 via INTRAVENOUS

## 2018-07-16 ENCOUNTER — Encounter (HOSPITAL_COMMUNITY): Payer: Medicare Other

## 2018-07-16 IMAGING — DX DG CHEST 2V
2 series · 2 of 2 positions shown · non-contrast
Comparison: 08/26/2015

CLINICAL DATA: Moderate persistent asthma without complication.
Hypertension.

EXAM:
CHEST  2 VIEW

[chest pa]
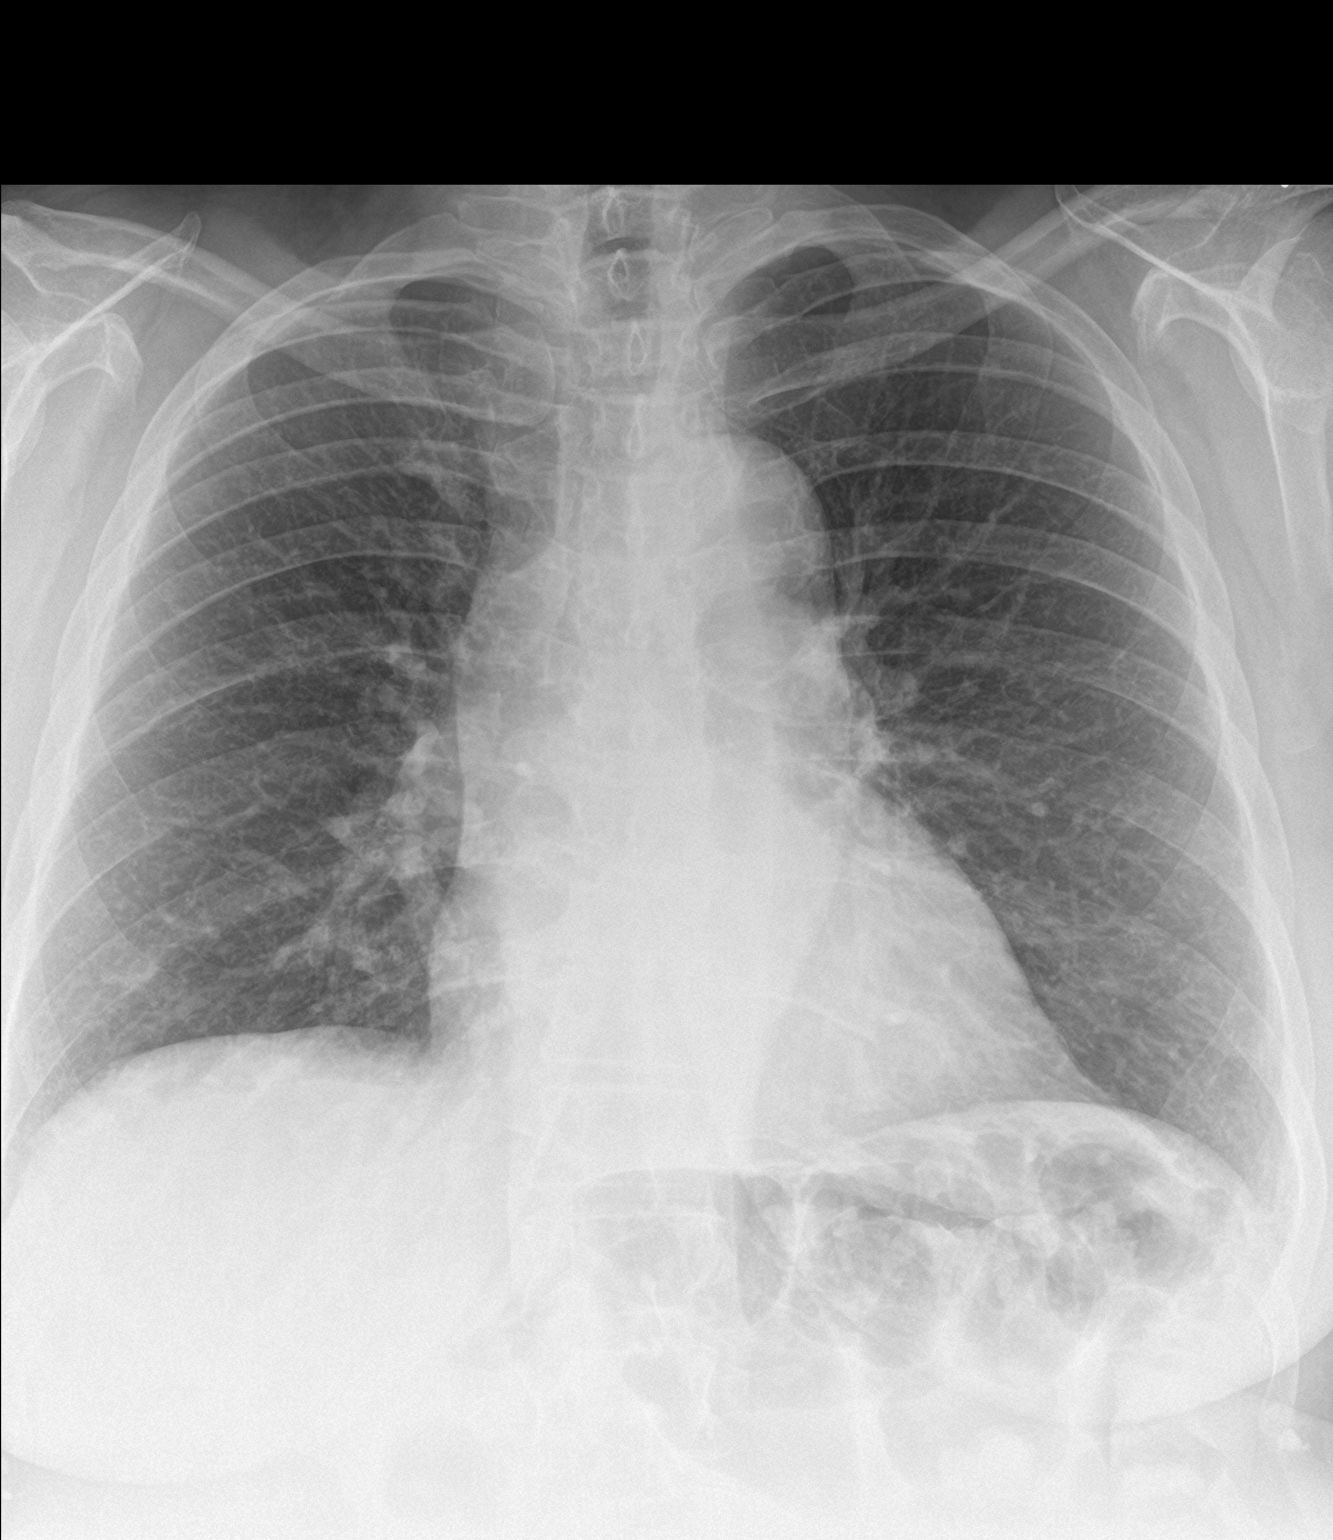

[chest lat]
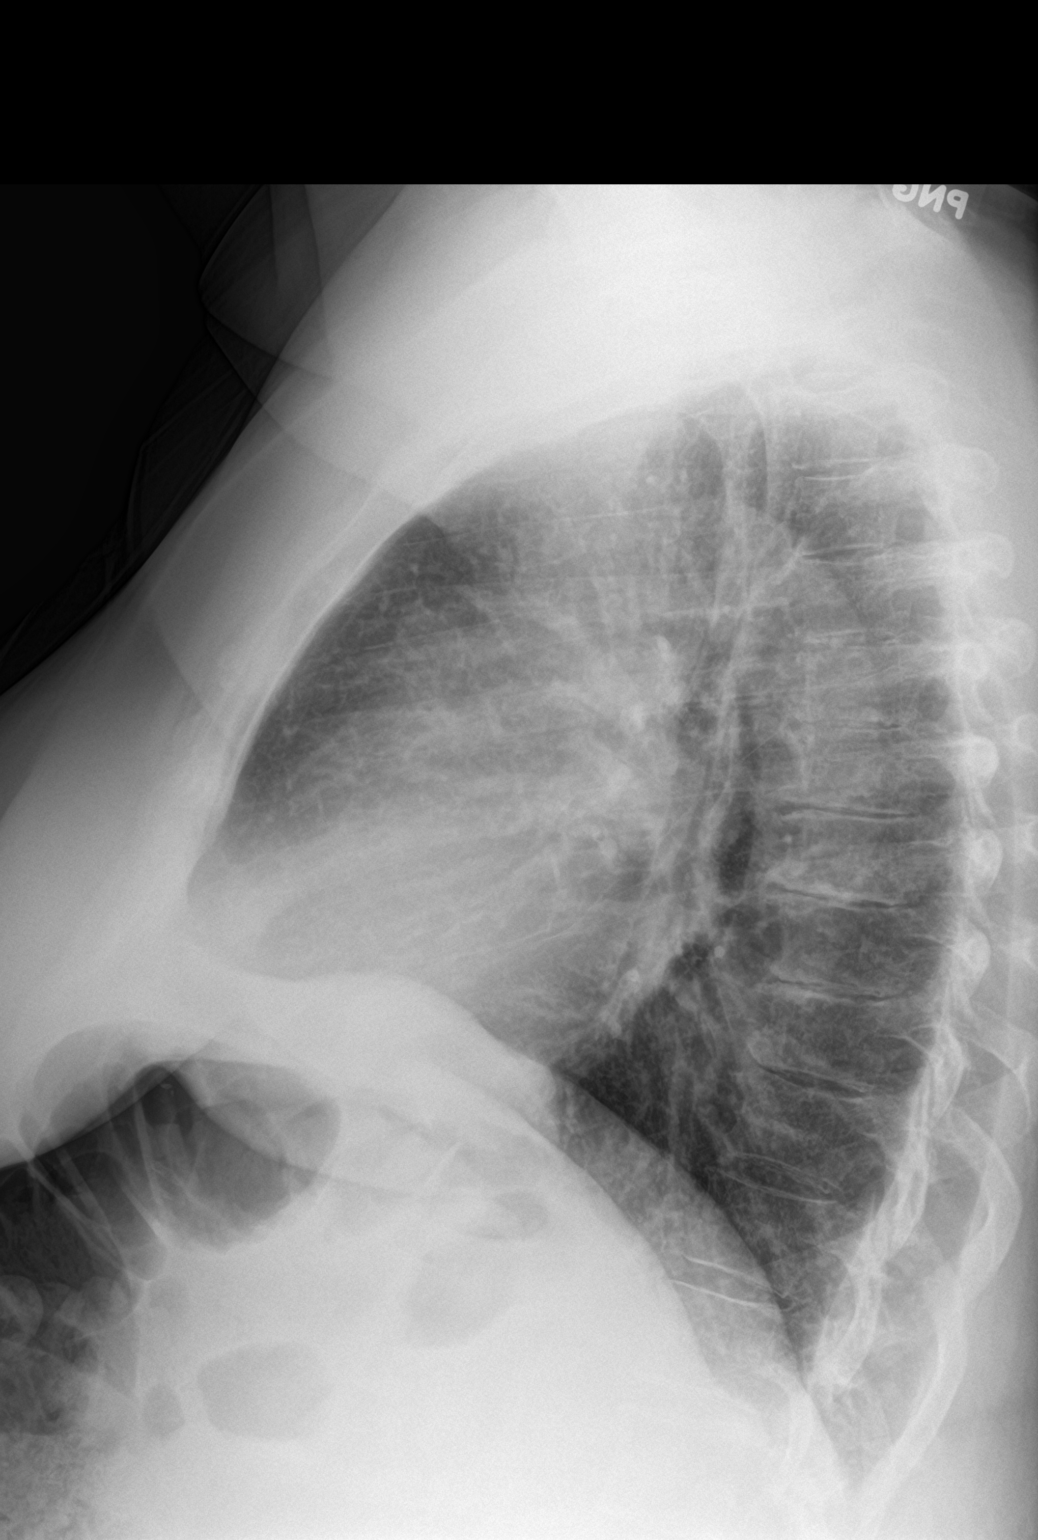

[2 of 2 positions shown; findings below may reference images not displayed]

FINDINGS: The heart size and mediastinal contours are within normal limits.
Stable mild ectasia of thoracic aorta. Both lungs are clear.
Thoracic spine degenerative changes again noted .
IMPRESSION: Stable exam.  No active cardiopulmonary disease.

## 2018-07-26 ENCOUNTER — Telehealth: Payer: Self-pay | Admitting: Pharmacy Technician

## 2018-07-26 MED ORDER — HYDROXYCHLOROQUINE SULFATE 200 MG PO TABS: ORAL_TABLET | ORAL | 0 refills | Status: DC

## 2018-07-26 NOTE — Telephone Encounter (Signed)
Patient is on Plaquenil. Please send in prescription  Called patient, no answer or voicemail 

## 2018-08-12 ENCOUNTER — Encounter (HOSPITAL_COMMUNITY)
Admission: RE | Admit: 2018-08-12 | Discharge: 2018-08-12 | Disposition: A | Discharge status: Home / Self Care | Payer: Medicare Other | Source: Ambulatory Visit | Attending: Rheumatology | Admitting: Rheumatology

## 2018-08-12 ENCOUNTER — Other Ambulatory Visit: Payer: Self-pay

## 2018-08-12 ENCOUNTER — Encounter (HOSPITAL_COMMUNITY): Payer: Self-pay

## 2018-08-12 DIAGNOSIS — M0579 Rheumatoid arthritis with rheumatoid factor of multiple sites without organ or systems involvement: Secondary | ICD-10-CM | POA: Insufficient documentation

## 2018-08-12 MED ORDER — SODIUM CHLORIDE 0.9 % IV SOLN
INTRAVENOUS | Status: DC
  Administered 2018-08-12: 10:00:00 via INTRAVENOUS

## 2018-08-12 MED ORDER — SODIUM CHLORIDE 0.9 % IV SOLN
1000.0000 mg | INTRAVENOUS | Status: DC
  Administered 2018-08-12: 11:00:00 1000 mg via INTRAVENOUS
  Filled 2018-08-12: qty 40

## 2018-08-14 ENCOUNTER — Ambulatory Visit: Payer: Medicare Other | Admitting: Internal Medicine

## 2018-09-03 ENCOUNTER — Telehealth: Payer: Self-pay | Admitting: Rheumatology

## 2018-09-03 NOTE — Telephone Encounter (Signed)
Patient left a voicemail stating she had emergency oral surgery and was given Amoxicillin to take until Sunday, 09/08/18.  Patient states she is scheduled for Orencia infusion on Monday 09/09/18.  Patient is requesting a return call to check if she needs to reschedule the appointment due to taking the antibiotic.

## 2018-09-04 NOTE — Telephone Encounter (Signed)
She can proceed with her Orencia infusion on 09/09/18 if she has completed the course of antibiotics and has no infection.

## 2018-09-04 NOTE — Telephone Encounter (Signed)
Advised patient of information below. Patient verbalized understanding.  °

## 2018-09-06 ENCOUNTER — Other Ambulatory Visit: Payer: Self-pay | Admitting: *Deleted

## 2018-09-06 NOTE — Progress Notes (Signed)
Infusion orders are current for patient CBC CMP Tylenol Benadryl appointments are up to date and follow up appointment  is scheduled TB gold not due yet.  

## 2018-09-09 ENCOUNTER — Other Ambulatory Visit: Payer: Self-pay

## 2018-09-09 ENCOUNTER — Encounter (HOSPITAL_COMMUNITY)
Admission: RE | Admit: 2018-09-09 | Discharge: 2018-09-09 | Disposition: A | Discharge status: Home / Self Care | Payer: Medicare Other | Source: Ambulatory Visit | Attending: Rheumatology | Admitting: Rheumatology

## 2018-09-09 ENCOUNTER — Encounter (HOSPITAL_COMMUNITY): Payer: Self-pay

## 2018-09-09 DIAGNOSIS — M0579 Rheumatoid arthritis with rheumatoid factor of multiple sites without organ or systems involvement: Secondary | ICD-10-CM | POA: Insufficient documentation

## 2018-09-09 LAB — COMPREHENSIVE METABOLIC PANEL
ALT: 14 U/L (ref 0–44)
AST: 19 U/L (ref 15–41)
Albumin: 3.9 g/dL (ref 3.5–5.0)
Alkaline Phosphatase: 71 U/L (ref 38–126)
Anion gap: 11 (ref 5–15)
BUN: 13 mg/dL (ref 8–23)
CO2: 27 mmol/L (ref 22–32)
Calcium: 9.3 mg/dL (ref 8.9–10.3)
Chloride: 101 mmol/L (ref 98–111)
Creatinine, Ser: 0.71 mg/dL (ref 0.44–1.00)
GFR calc Af Amer: >60 mL/min (ref >60)
GFR calc non Af Amer: >60 mL/min (ref >60)
Glucose, Bld: 83 mg/dL (ref 70–99)
Potassium: 3.4 mmol/L — ABNORMAL LOW (ref 3.5–5.1)
Sodium: 139 mmol/L (ref 135–145)
Total Bilirubin: 0.7 mg/dL (ref 0.3–1.2)
Total Protein: 6.9 g/dL (ref 6.5–8.1)

## 2018-09-09 LAB — CBC
HCT: 41.6 % (ref 36.0–46.0)
Hemoglobin: 13.3 g/dL (ref 12.0–15.0)
MCH: 31.4 pg (ref 26.0–34.0)
MCHC: 32 g/dL (ref 30.0–36.0)
MCV: 98.3 fL (ref 80.0–100.0)
Platelets: 350 10*3/uL (ref 150–400)
RBC: 4.23 MIL/uL (ref 3.87–5.11)
RDW: 12.5 % (ref 11.5–15.5)
WBC: 6.5 10*3/uL (ref 4.0–10.5)
nRBC: 0 % (ref 0.0–0.2)

## 2018-09-09 MED ORDER — ACETAMINOPHEN 325 MG PO TABS: 650.0000 mg | ORAL_TABLET | Freq: Once | ORAL | Status: DC

## 2018-09-09 MED ORDER — DIPHENHYDRAMINE HCL 25 MG PO CAPS: 25.0000 mg | ORAL_CAPSULE | Freq: Once | ORAL | Status: DC

## 2018-09-09 MED ORDER — SODIUM CHLORIDE 0.9 % IV SOLN
1000.0000 mg | INTRAVENOUS | Status: DC
  Administered 2018-09-09: 1000 mg via INTRAVENOUS
  Filled 2018-09-09: qty 40

## 2018-09-09 MED ORDER — SODIUM CHLORIDE 0.9 % IV SOLN
Freq: Once | INTRAVENOUS | Status: AC
  Administered 2018-09-09: 09:00:00 via INTRAVENOUS

## 2018-09-09 NOTE — Progress Notes (Signed)
K is low. Please notify pt and fax results to her PCP.

## 2018-09-18 NOTE — Progress Notes (Signed)
Office Visit Note  Patient: Monica [name]             Date of Birth: 09-04-1940           MRN: [id number]             PCP: Juluis RainierBarnes, Elizabeth, MD Referring: Juluis RainierBarnes, Elizabeth, MD Visit Date: 09/25/2018 Occupation: @GUAROCC @  Subjective: Medication monitoring.    History of Present Illness: Monica [name] is a 78 y.o. female with history of rheumatoid arthritis and osteoarthritis overlap.  Patient states she has been doing quite well with Orencia and Plaquenil combination.  She has been going for eye examinations on a regular basis.  [place].  It is coming up in July.  She denies any joint swelling currently.  She continues to have some discomfort from underlying osteoarthritis especially in her right [facility].  She also has some discomfort in her knee joints.  Activities of Daily Living:  Patient reports morning stiffness for  45 minutes.   Patient Reports nocturnal pain.  Difficulty dressing/grooming: Denies Difficulty climbing stairs: Reports Difficulty getting out of chair: Reports Difficulty using hands for taps, buttons, cutlery, and/or writing: Denies  Review of Systems  Constitutional: Positive for fatigue. Negative for night sweats, weight gain and weight loss.  HENT: Positive for mouth dryness. Negative for mouth sores, trouble swallowing, trouble swallowing and nose dryness.   Eyes: Positive for dryness. Negative for pain, redness and visual disturbance.  Respiratory: Positive for shortness of breath. Negative for cough and difficulty breathing.        Asthma  Cardiovascular: Negative for chest pain, palpitations, hypertension, irregular heartbeat and swelling in legs/feet.  Gastrointestinal: Negative for blood in stool, constipation and diarrhea.  Endocrine: Negative for increased urination.  Genitourinary: Negative for vaginal dryness.  Musculoskeletal: Positive for arthralgias, joint pain and morning stiffness.  Negative for joint swelling, myalgias, muscle weakness, muscle tenderness and myalgias.  Skin: Negative for color change, rash, hair loss, skin tightness, ulcers and sensitivity to sunlight.  Allergic/Immunologic: Negative for susceptible to infections.  Neurological: Negative for dizziness, memory loss, night sweats and weakness.  Hematological: Negative for swollen glands.  Psychiatric/Behavioral: Negative for depressed mood and sleep disturbance. The patient is not nervous/anxious.     PMFS History:  Patient Active Problem List   Diagnosis Date Noted  . Osteopenia 12/20/2016  . History of depression 12/11/2016  . Asthma, mild persistent 09/08/2016  . High risk medication use 05/15/2016  . Primary osteoarthritis of both hands 05/15/2016  . Primary osteoarthritis of both feet 05/15/2016  . Primary osteoarthritis of both knees 05/15/2016  . Vitamin D deficiency 05/15/2016  . History of hypertension 05/15/2016  . History of hyperlipidemia 05/15/2016  . Rheumatoid arthritis with rheumatoid factor of multiple sites without organ or systems involvement (HCC) 01/10/2016  . Seasonal allergic rhinitis 10/03/2011  . HYPERLIPIDEMIA 12/31/2008  . HYPERTENSION 12/31/2008  . Mild persistent asthmatic bronchitis with exacerbation 12/31/2008    Past Medical History:  Diagnosis Date  . Arthritis   . 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 Hyperlipidemia   . Hypertension     Family History  Problem Relation Age of Onset  . Lung cancer Father   . Alzheimer's disease Mother    Past Surgical History:  Procedure Laterality Date  . CARPAL TUNNEL RELEASE    . FOOT SURGERY     Social History   Social  History Narrative  . Not on file   Immunization History  Administered Date(s) Administered  . Influenza Nasal 02/29/2012  . Influenza Split 03/02/2011  . Influenza, High Dose Seasonal PF 02/12/2017, 02/12/2018  . Influenza,inj,Quad PF,6+ Mos 02/25/2013, 02/23/2014  .  Influenza-Unspecified 03/15/2015  . Pneumococcal Conjugate-13 02/25/2013     Objective: Vital Signs: BP 138/82 (BP Location: Left Arm, Patient Position: Sitting, Cuff Size: Large)   Pulse 75   Resp 13   Ht 5\' 3"  (1.6 m)   Wt 234 lb 3.2 oz (106.2 kg)   BMI 41.49 kg/m    Physical Exam Vitals signs and nursing note reviewed.  Constitutional:      Appearance: She is well-developed.  HENT:     Head: Normocephalic and atraumatic.  Eyes:     Conjunctiva/sclera: Conjunctivae normal.  Neck:     Musculoskeletal: Normal range of motion.  Cardiovascular:     Rate and Rhythm: Normal rate and regular rhythm.     Heart sounds: Normal heart sounds.  Pulmonary:     Effort: Pulmonary effort is normal.     Breath sounds: Normal breath sounds.  Abdominal:     General: Bowel sounds are normal.     Palpations: Abdomen is soft.  Lymphadenopathy:     Cervical: No cervical adenopathy.  Skin:    General: Skin is warm and dry.     Capillary Refill: Capillary refill takes less than 2 seconds.  Neurological:     Mental Status: She is alert and oriented to person, place, and time.  Psychiatric:        Behavior: Behavior normal.      Musculoskeletal Exam: C-spine thoracic and lumbar spine good range of motion.  She has discomfort range of motion of her lumbar spine.  Shoulder joints elbow joints wrist joint MCPs PIPs with good range of motion.  She has right CMC thickening and bilateral PIP and DIP thickening.  Hip joints, knee joints, ankles MTPs PIPs been good range of motion with no synovitis.  CDAI Exam: CDAI Score: 0.4  Patient Global Assessment: 2 (mm); Provider Global Assessment: 2 (mm) Swollen: 0 ; Tender: 0  Joint Exam   Not documented   There is currently no information documented on the homunculus. Go to the Rheumatology activity and complete the homunculus joint exam.  Investigation: No additional findings.  Imaging: No results found.  Recent Labs: Lab Results  Component  Value Date   WBC 6.5 09/09/2018   HGB 13.3 09/09/2018   PLT 350 09/09/2018   NA 139 09/09/2018   K 3.4 (L) 09/09/2018   CL 101 09/09/2018   CO2 27 09/09/2018   GLUCOSE 83 09/09/2018   BUN 13 09/09/2018   CREATININE 0.71 09/09/2018   BILITOT 0.7 09/09/2018   ALKPHOS 71 09/09/2018   AST 19 09/09/2018   ALT 14 09/09/2018   PROT 6.9 09/09/2018   ALBUMIN 3.9 09/09/2018   CALCIUM 9.3 09/09/2018   GFRAA >60 09/09/2018   QFTBGOLD Negative 07/24/2016   QFTBGOLDPLUS NEGATIVE 06/11/2018    Speciality Comments: PLQ eye exam: 08/14/2017 normal. [facility]. Follow up in 1 year. Orencia 1000 mg / started on 09/2016 has  Prior therapy includes: Simponi Aria (inadequate response)    Procedures:  No procedures performed Allergies: Arava [leflunomide] and Piroxicam   Assessment / Plan:     Visit Diagnoses: Rheumatoid arthritis with rheumatoid factor of multiple sites without organ or systems involvement (HCC)-patient is clinically doing well with combination of Orencia and Plaquenil.  No synovitis was noted on examination today.  High risk medication use - Current regimen includes Orencia IV 1000 mg every 28 days (last infusion 09/09/18) and Plaquenil 200 mg in the morning and 100 mg at bedtime.  Last TB gold negative on 06/11/2018 and will monitor yearly.  Most recent CBC/CMP within normal limits except for low potassium on 09/09/2018.  Will monitor with every other infusion.  She received her flu vaccine in October and previously Prevnar 13.  Recommend Pneumovax 23 and Shingrix as indicated.  Primary osteoarthritis of both hands-she has significant osteoarthritis with DIP and PIP thickening.  Hand muscle strengthening exercises were discussed.  Primary osteoarthritis of both knees-she continues to have some knee joint discomfort.  Weight loss diet and exercise was discussed.  Primary osteoarthritis of both feet-currently not having much discomfort.  Osteopenia of multiple sites-repeat  bone density is due in July.  History of vitamin D deficiency-she takes supplement.  History of asthma  History of hyperlipidemia  History of hypertension-blood pressure is well controlled.  History of depression   Orders: No orders of the defined types were placed in this encounter.  No orders of the defined types were placed in this encounter.   Face-to-face time spent with patient was 30 minutes. Greater than 50% of time was spent in counseling and coordination of care.  Follow-Up Instructions: Return in about 5 months (around 02/25/2019) for Rheumatoid arthritis.   Pollyann SavoyShaili Varvara Legault, MD  Note - This record has been created using Animal nutritionistDragon software.  Chart creation errors have been sought, but may not always  have been located. Such creation errors do not reflect on  the standard of medical care.

## 2018-09-25 ENCOUNTER — Encounter: Payer: Self-pay | Admitting: Rheumatology

## 2018-09-25 ENCOUNTER — Other Ambulatory Visit: Payer: Self-pay

## 2018-09-25 ENCOUNTER — Ambulatory Visit (INDEPENDENT_AMBULATORY_CARE_PROVIDER_SITE_OTHER): Payer: Medicare Other | Admitting: Rheumatology

## 2018-09-25 VITALS — BP 138/82 | HR 75 | Resp 13 | Ht 63.0 in | Wt 234.2 lb

## 2018-09-25 DIAGNOSIS — M19071 Primary osteoarthritis, right ankle and foot: Secondary | ICD-10-CM

## 2018-09-25 DIAGNOSIS — M170 Bilateral primary osteoarthritis of knee: Secondary | ICD-10-CM | POA: Diagnosis not present

## 2018-09-25 DIAGNOSIS — M0579 Rheumatoid arthritis with rheumatoid factor of multiple sites without organ or systems involvement: Secondary | ICD-10-CM | POA: Diagnosis not present

## 2018-09-25 DIAGNOSIS — M8589 Other specified disorders of bone density and structure, multiple sites: Secondary | ICD-10-CM

## 2018-09-25 DIAGNOSIS — Z8659 Personal history of other mental and behavioral disorders: Secondary | ICD-10-CM

## 2018-09-25 DIAGNOSIS — M19042 Primary osteoarthritis, left hand: Secondary | ICD-10-CM | POA: Diagnosis not present

## 2018-09-25 DIAGNOSIS — M19041 Primary osteoarthritis, right hand: Secondary | ICD-10-CM

## 2018-09-25 DIAGNOSIS — Z79899 Other long term (current) drug therapy: Secondary | ICD-10-CM

## 2018-09-25 DIAGNOSIS — Z8709 Personal history of other diseases of the respiratory system: Secondary | ICD-10-CM | POA: Diagnosis not present

## 2018-09-25 DIAGNOSIS — Z8639 Personal history of other endocrine, nutritional and metabolic disease: Secondary | ICD-10-CM | POA: Diagnosis not present

## 2018-09-25 DIAGNOSIS — M19072 Primary osteoarthritis, left ankle and foot: Secondary | ICD-10-CM

## 2018-09-25 DIAGNOSIS — Z8679 Personal history of other diseases of the circulatory system: Secondary | ICD-10-CM

## 2018-10-04 ENCOUNTER — Other Ambulatory Visit: Payer: Self-pay | Admitting: *Deleted

## 2018-10-04 NOTE — Progress Notes (Signed)
Infusion orders are current for patient CBC CMP Tylenol Benadryl appointments are up to date and follow up appointment  is scheduled TB gold not due yet.  

## 2018-10-07 ENCOUNTER — Encounter (HOSPITAL_COMMUNITY)
Admission: RE | Admit: 2018-10-07 | Discharge: 2018-10-07 | Disposition: A | Discharge status: Home / Self Care | Payer: Medicare Other | Source: Ambulatory Visit | Attending: Rheumatology | Admitting: Rheumatology

## 2018-10-07 ENCOUNTER — Other Ambulatory Visit: Payer: Self-pay

## 2018-10-07 ENCOUNTER — Encounter (HOSPITAL_COMMUNITY): Payer: Self-pay

## 2018-10-07 DIAGNOSIS — M0579 Rheumatoid arthritis with rheumatoid factor of multiple sites without organ or systems involvement: Secondary | ICD-10-CM | POA: Insufficient documentation

## 2018-10-07 MED ORDER — ACETAMINOPHEN 325 MG PO TABS: 650.0000 mg | ORAL_TABLET | Freq: Once | ORAL | Status: DC

## 2018-10-07 MED ORDER — SODIUM CHLORIDE 0.9 % IV SOLN
1000.0000 mg | INTRAVENOUS | Status: DC
  Administered 2018-10-07: 1000 mg via INTRAVENOUS
  Filled 2018-10-07: qty 40

## 2018-10-07 MED ORDER — DIPHENHYDRAMINE HCL 25 MG PO CAPS: 25.0000 mg | ORAL_CAPSULE | Freq: Once | ORAL | Status: DC

## 2018-10-07 MED ORDER — SODIUM CHLORIDE 0.9 % IV SOLN
INTRAVENOUS | Status: DC
  Administered 2018-10-07: 10:00:00 via INTRAVENOUS

## 2018-10-22 DIAGNOSIS — F419 Anxiety disorder, unspecified: Secondary | ICD-10-CM | POA: Diagnosis not present

## 2018-10-22 DIAGNOSIS — M069 Rheumatoid arthritis, unspecified: Secondary | ICD-10-CM | POA: Diagnosis not present

## 2018-10-22 DIAGNOSIS — E7800 Pure hypercholesterolemia, unspecified: Secondary | ICD-10-CM | POA: Diagnosis not present

## 2018-10-22 DIAGNOSIS — F339 Major depressive disorder, recurrent, unspecified: Secondary | ICD-10-CM | POA: Diagnosis not present

## 2018-10-22 DIAGNOSIS — J45909 Unspecified asthma, uncomplicated: Secondary | ICD-10-CM | POA: Diagnosis not present

## 2018-10-22 DIAGNOSIS — R7301 Impaired fasting glucose: Secondary | ICD-10-CM | POA: Diagnosis not present

## 2018-10-22 DIAGNOSIS — Z6841 Body Mass Index (BMI) 40.0 and over, adult: Secondary | ICD-10-CM | POA: Diagnosis not present

## 2018-10-22 DIAGNOSIS — Z7189 Other specified counseling: Secondary | ICD-10-CM | POA: Diagnosis not present

## 2018-10-22 DIAGNOSIS — I10 Essential (primary) hypertension: Secondary | ICD-10-CM | POA: Diagnosis not present

## 2018-11-04 ENCOUNTER — Other Ambulatory Visit: Payer: Self-pay

## 2018-11-04 ENCOUNTER — Encounter (HOSPITAL_COMMUNITY): Payer: Self-pay

## 2018-11-04 ENCOUNTER — Encounter (HOSPITAL_COMMUNITY)
Admission: RE | Admit: 2018-11-04 | Discharge: 2018-11-04 | Disposition: A | Discharge status: Home / Self Care | Payer: Medicare Other | Source: Ambulatory Visit | Attending: Rheumatology | Admitting: Rheumatology

## 2018-11-04 DIAGNOSIS — M0579 Rheumatoid arthritis with rheumatoid factor of multiple sites without organ or systems involvement: Secondary | ICD-10-CM | POA: Diagnosis not present

## 2018-11-04 LAB — BASIC METABOLIC PANEL
Anion gap: 12 (ref 5–15)
BUN: 16 mg/dL (ref 8–23)
CO2: 26 mmol/L (ref 22–32)
Calcium: 9.3 mg/dL (ref 8.9–10.3)
Chloride: 103 mmol/L (ref 98–111)
Creatinine, Ser: 0.71 mg/dL (ref 0.44–1.00)
GFR calc Af Amer: >60 mL/min (ref >60)
GFR calc non Af Amer: >60 mL/min (ref >60)
Glucose, Bld: 91 mg/dL (ref 70–99)
Potassium: 3.7 mmol/L (ref 3.5–5.1)
Sodium: 141 mmol/L (ref 135–145)

## 2018-11-04 LAB — CBC WITH DIFFERENTIAL/PLATELET
Abs Immature Granulocytes: 0.02 10*3/uL (ref 0.00–0.07)
Basophils Absolute: 0 10*3/uL (ref 0.0–0.1)
Basophils Relative: 1 %
Eosinophils Absolute: 0.1 10*3/uL (ref 0.0–0.5)
Eosinophils Relative: 2 %
HCT: 40.9 % (ref 36.0–46.0)
Hemoglobin: 13.6 g/dL (ref 12.0–15.0)
Immature Granulocytes: 0 %
Lymphocytes Relative: 25 %
Lymphs Abs: 1.5 10*3/uL (ref 0.7–4.0)
MCH: 32.7 pg (ref 26.0–34.0)
MCHC: 33.3 g/dL (ref 30.0–36.0)
MCV: 98.3 fL (ref 80.0–100.0)
Monocytes Absolute: 0.6 10*3/uL (ref 0.1–1.0)
Monocytes Relative: 10 %
Neutro Abs: 3.7 10*3/uL (ref 1.7–7.7)
Neutrophils Relative %: 62 %
Platelets: 329 10*3/uL (ref 150–400)
RBC: 4.16 MIL/uL (ref 3.87–5.11)
RDW: 12.7 % (ref 11.5–15.5)
WBC: 5.9 10*3/uL (ref 4.0–10.5)
nRBC: 0 % (ref 0.0–0.2)

## 2018-11-04 MED ORDER — SODIUM CHLORIDE 0.9 % IV SOLN
Freq: Once | INTRAVENOUS | Status: AC
  Administered 2018-11-04: 10:00:00 via INTRAVENOUS

## 2018-11-04 MED ORDER — ACETAMINOPHEN 325 MG PO TABS: 650.0000 mg | ORAL_TABLET | Freq: Once | ORAL | Status: DC

## 2018-11-04 MED ORDER — SODIUM CHLORIDE 0.9 % IV SOLN
1000.0000 mg | Freq: Once | INTRAVENOUS | Status: AC
  Administered 2018-11-04: 1000 mg via INTRAVENOUS
  Filled 2018-11-04: qty 40

## 2018-11-04 MED ORDER — DIPHENHYDRAMINE HCL 25 MG PO CAPS: 25.0000 mg | ORAL_CAPSULE | Freq: Once | ORAL | Status: DC

## 2018-11-06 DIAGNOSIS — J45909 Unspecified asthma, uncomplicated: Secondary | ICD-10-CM | POA: Diagnosis not present

## 2018-11-06 DIAGNOSIS — M069 Rheumatoid arthritis, unspecified: Secondary | ICD-10-CM | POA: Diagnosis not present

## 2018-11-06 DIAGNOSIS — Z1231 Encounter for screening mammogram for malignant neoplasm of breast: Secondary | ICD-10-CM | POA: Diagnosis not present

## 2018-11-06 DIAGNOSIS — M8589 Other specified disorders of bone density and structure, multiple sites: Secondary | ICD-10-CM | POA: Diagnosis not present

## 2018-11-06 DIAGNOSIS — Z8262 Family history of osteoporosis: Secondary | ICD-10-CM | POA: Diagnosis not present

## 2018-11-11 ENCOUNTER — Telehealth: Payer: Self-pay | Admitting: Pharmacist

## 2018-11-11 NOTE — Telephone Encounter (Signed)
Received BMD results from [facility] for patient DEXA on 11/06/2018.  T-score -2.0 at lumbar spine indicating osteopenia.  Right hip had -11% change in BMD.  Advised patient to take calcium 1200 mg daily, vitamin D 800 units daily, and perform weight bearing exercises.   She is already taking a vitamin D 2000 units for history of vitamin D deficiency. She prefers to obtain calcium through diet vs a supplement. She is eating 2 servings of calcium daily.  Instructed patient to increase to 3 servings of dairy daily to obtain recommended amount of 1200 mg.  Patient verbalized understanding.  All questions encouraged and answered.  Instructed patient to call with any further questions or concerns.  Will send document to the scan center.  Mariella Saa, PharmD, [facility] Clinical Pharmacist  11/11/2018 1:47 PM

## 2018-11-20 DIAGNOSIS — Z79899 Other long term (current) drug therapy: Secondary | ICD-10-CM | POA: Diagnosis not present

## 2018-11-20 DIAGNOSIS — H43813 Vitreous degeneration, bilateral: Secondary | ICD-10-CM | POA: Diagnosis not present

## 2018-11-20 DIAGNOSIS — H52203 Unspecified astigmatism, bilateral: Secondary | ICD-10-CM | POA: Diagnosis not present

## 2018-11-20 DIAGNOSIS — H04123 Dry eye syndrome of bilateral lacrimal glands: Secondary | ICD-10-CM | POA: Diagnosis not present

## 2018-11-26 DIAGNOSIS — H6123 Impacted cerumen, bilateral: Secondary | ICD-10-CM | POA: Diagnosis not present

## 2018-12-02 ENCOUNTER — Encounter (HOSPITAL_COMMUNITY)
Admission: RE | Admit: 2018-12-02 | Discharge: 2018-12-02 | Disposition: A | Discharge status: Home / Self Care | Payer: Medicare Other | Source: Ambulatory Visit | Attending: Rheumatology | Admitting: Rheumatology

## 2018-12-02 ENCOUNTER — Other Ambulatory Visit: Payer: Self-pay

## 2018-12-02 DIAGNOSIS — M0579 Rheumatoid arthritis with rheumatoid factor of multiple sites without organ or systems involvement: Secondary | ICD-10-CM | POA: Diagnosis not present

## 2018-12-02 MED ORDER — DIPHENHYDRAMINE HCL 25 MG PO CAPS: 25.0000 mg | ORAL_CAPSULE | Freq: Once | ORAL | Status: DC

## 2018-12-02 MED ORDER — SODIUM CHLORIDE 0.9 % IV SOLN
1000.0000 mg | INTRAVENOUS | Status: DC
  Administered 2018-12-02: 1000 mg via INTRAVENOUS
  Filled 2018-12-02: qty 40

## 2018-12-02 MED ORDER — ACETAMINOPHEN 325 MG PO TABS: 650.0000 mg | ORAL_TABLET | Freq: Once | ORAL | Status: DC

## 2018-12-02 MED ORDER — SODIUM CHLORIDE 0.9 % IV SOLN
INTRAVENOUS | Status: DC
  Administered 2018-12-02: 11:00:00 via INTRAVENOUS

## 2018-12-30 ENCOUNTER — Encounter (HOSPITAL_COMMUNITY)
Admission: RE | Admit: 2018-12-30 | Discharge: 2018-12-30 | Disposition: A | Discharge status: Home / Self Care | Payer: Medicare Other | Source: Ambulatory Visit | Attending: Rheumatology | Admitting: Rheumatology

## 2018-12-30 ENCOUNTER — Encounter (HOSPITAL_COMMUNITY): Payer: Self-pay

## 2018-12-30 ENCOUNTER — Other Ambulatory Visit: Payer: Self-pay

## 2018-12-30 DIAGNOSIS — M0579 Rheumatoid arthritis with rheumatoid factor of multiple sites without organ or systems involvement: Secondary | ICD-10-CM | POA: Diagnosis not present

## 2018-12-30 LAB — BASIC METABOLIC PANEL
Anion gap: 9 (ref 5–15)
BUN: 14 mg/dL (ref 8–23)
CO2: 27 mmol/L (ref 22–32)
Calcium: 9.2 mg/dL (ref 8.9–10.3)
Chloride: 105 mmol/L (ref 98–111)
Creatinine, Ser: 0.81 mg/dL (ref 0.44–1.00)
GFR calc Af Amer: >60 mL/min (ref >60)
GFR calc non Af Amer: >60 mL/min (ref >60)
Glucose, Bld: 85 mg/dL (ref 70–99)
Potassium: 4.2 mmol/L (ref 3.5–5.1)
Sodium: 141 mmol/L (ref 135–145)

## 2018-12-30 LAB — CBC
HCT: 41.4 % (ref 36.0–46.0)
Hemoglobin: 13.2 g/dL (ref 12.0–15.0)
MCH: 32 pg (ref 26.0–34.0)
MCHC: 31.9 g/dL (ref 30.0–36.0)
MCV: 100.2 fL — ABNORMAL HIGH (ref 80.0–100.0)
Platelets: 320 10*3/uL (ref 150–400)
RBC: 4.13 MIL/uL (ref 3.87–5.11)
RDW: 12.3 % (ref 11.5–15.5)
WBC: 6.7 10*3/uL (ref 4.0–10.5)
nRBC: 0 % (ref 0.0–0.2)

## 2018-12-30 MED ORDER — ACETAMINOPHEN 325 MG PO TABS: 650.0000 mg | ORAL_TABLET | Freq: Once | ORAL | Status: DC

## 2018-12-30 MED ORDER — DIPHENHYDRAMINE HCL 25 MG PO CAPS: 25.0000 mg | ORAL_CAPSULE | Freq: Once | ORAL | Status: DC

## 2018-12-30 MED ORDER — SODIUM CHLORIDE 0.9 % IV SOLN
Freq: Once | INTRAVENOUS | Status: AC
  Administered 2018-12-30: 10:00:00 via INTRAVENOUS

## 2018-12-30 MED ORDER — SODIUM CHLORIDE 0.9 % IV SOLN
1000.0000 mg | INTRAVENOUS | Status: DC
  Administered 2018-12-30: 1000 mg via INTRAVENOUS
  Filled 2018-12-30: qty 40

## 2018-12-30 NOTE — Progress Notes (Signed)
Labs are stable.

## 2019-01-01 DIAGNOSIS — I10 Essential (primary) hypertension: Secondary | ICD-10-CM | POA: Diagnosis not present

## 2019-01-01 DIAGNOSIS — F339 Major depressive disorder, recurrent, unspecified: Secondary | ICD-10-CM | POA: Diagnosis not present

## 2019-01-01 DIAGNOSIS — J45909 Unspecified asthma, uncomplicated: Secondary | ICD-10-CM | POA: Diagnosis not present

## 2019-01-01 DIAGNOSIS — M069 Rheumatoid arthritis, unspecified: Secondary | ICD-10-CM | POA: Diagnosis not present

## 2019-01-07 ENCOUNTER — Other Ambulatory Visit: Payer: Self-pay | Admitting: Rheumatology

## 2019-01-07 DIAGNOSIS — M0579 Rheumatoid arthritis with rheumatoid factor of multiple sites without organ or systems involvement: Secondary | ICD-10-CM

## 2019-01-07 NOTE — Telephone Encounter (Signed)
Last Visit: 09/25/18 Next Visit: 02/26/19 Labs: 11/04/18 WNL Eye exam: 11/20/18 WNL  Okay to refill per Dr. Estanislado Pandy

## 2019-01-27 ENCOUNTER — Encounter (HOSPITAL_COMMUNITY): Payer: Self-pay

## 2019-01-27 ENCOUNTER — Encounter (HOSPITAL_COMMUNITY)
Admission: RE | Admit: 2019-01-27 | Discharge: 2019-01-27 | Disposition: A | Discharge status: Home / Self Care | Payer: Medicare Other | Source: Ambulatory Visit | Attending: Rheumatology | Admitting: Rheumatology

## 2019-01-27 ENCOUNTER — Other Ambulatory Visit: Payer: Self-pay

## 2019-01-27 DIAGNOSIS — M0579 Rheumatoid arthritis with rheumatoid factor of multiple sites without organ or systems involvement: Secondary | ICD-10-CM | POA: Insufficient documentation

## 2019-01-27 MED ORDER — ACETAMINOPHEN 325 MG PO TABS: 650.0000 mg | ORAL_TABLET | Freq: Once | ORAL | Status: DC

## 2019-01-27 MED ORDER — DIPHENHYDRAMINE HCL 25 MG PO CAPS: 25.0000 mg | ORAL_CAPSULE | Freq: Once | ORAL | Status: DC

## 2019-01-27 MED ORDER — SODIUM CHLORIDE 0.9 % IV SOLN
Freq: Once | INTRAVENOUS | Status: AC
  Administered 2019-01-27: 11:00:00 via INTRAVENOUS

## 2019-01-27 MED ORDER — SODIUM CHLORIDE 0.9 % IV SOLN
1000.0000 mg | Freq: Once | INTRAVENOUS | Status: AC
  Administered 2019-01-27: 11:00:00 1000 mg via INTRAVENOUS
  Filled 2019-01-27: qty 40

## 2019-02-12 NOTE — Progress Notes (Signed)
Office Visit Note  Patient: Monica [name]             Date of Birth: Dec 09, 1940           MRN: [id number]             PCP: Leighton Ruff, MD Referring: Leighton Ruff, MD Visit Date: 02/26/2019 Occupation: @GUAROCC @  Subjective: Pain in multiple joints      History of Present Illness: Monica [name] is a 78 y.o. female with history of seropositive rheumatoid arthritis and osteoarthritis.  She is on Orencia 1,000 mg IV infusions every 28 days and plaquenil 200 mg 1 tablet in the morning and 1/2 tablet in the evening.  Her last infusion was on 02/14/19.  Patient states that she has been tolerating her medications well.  She states she continues to have discomfort in her joints but no joint swelling.  She complains of pain and discomfort in her bilateral hands, bilateral knee joints and her feet.  She states her right knee joint gives out on her at times.  She has to take Tylenol and ibuprofen for pain management.  Activities of Daily Living:  Patient reports morning stiffness for all day hours.   Patient Denies nocturnal pain.  Difficulty dressing/grooming: Denies Difficulty climbing stairs: Reports Difficulty getting out of chair: Reports Difficulty using hands for taps, buttons, cutlery, and/or writing: Denies  Review of Systems  Constitutional: Positive for fatigue. Negative for night sweats, weight gain and weight loss.  HENT: Positive for mouth dryness. Negative for mouth sores, trouble swallowing, trouble swallowing and nose dryness.   Eyes: Positive for dryness. Negative for pain, redness and visual disturbance.  Respiratory: Negative for cough, hemoptysis, shortness of breath and difficulty breathing.   Cardiovascular: Negative for chest pain, palpitations, hypertension, irregular heartbeat and swelling in legs/feet.  Gastrointestinal: Negative for blood in stool, constipation and diarrhea.  Endocrine: Negative for increased urination.  Genitourinary: Negative for  painful urination and vaginal dryness.  Musculoskeletal: Positive for arthralgias, joint pain and morning stiffness. Negative for joint swelling, myalgias, muscle weakness, muscle tenderness and myalgias.  Skin: Negative for color change, pallor, rash, hair loss, nodules/bumps, skin tightness, ulcers and sensitivity to sunlight.  Allergic/Immunologic: Negative for susceptible to infections.  Neurological: Negative for dizziness, numbness, headaches, memory loss, night sweats and weakness.  Hematological: Negative for swollen glands.  Psychiatric/Behavioral: Positive for depressed mood and sleep disturbance. The patient is nervous/anxious.     PMFS History:  Patient Active Problem List   Diagnosis Date Noted   Asthmatic bronchitis 02/16/2019   Osteopenia 12/20/2016   History of depression 12/11/2016   High risk medication use 05/15/2016   Primary osteoarthritis of both hands 05/15/2016   Primary osteoarthritis of both feet 05/15/2016   Primary osteoarthritis of both knees 05/15/2016   Vitamin D deficiency 05/15/2016   History of hypertension 05/15/2016   History of hyperlipidemia 05/15/2016   Rheumatoid arthritis with rheumatoid factor of multiple sites without organ or systems involvement ([place]) 01/10/2016   Seasonal allergic rhinitis 10/03/2011   HYPERLIPIDEMIA 12/31/2008   HYPERTENSION 12/31/2008   Mild persistent asthmatic bronchitis with exacerbation 12/31/2008    Past Medical History:  Diagnosis Date   Arthritis    Asthma    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, FEV1 119%   Hyperlipidemia    Hypertension     Family History  Problem Relation Age of Onset   Lung cancer Father    Alzheimer's disease Mother    Past Surgical History:  Procedure Laterality Date   CARPAL TUNNEL RELEASE     FOOT SURGERY     Social History   Social History Narrative   Not on file   Immunization History  Administered Date(s) Administered   Fluad Quad(high Dose  65+) 02/13/2019   Influenza Nasal 02/29/2012   Influenza Split 03/02/2011   Influenza, High Dose Seasonal PF 02/12/2017, 02/12/2018   Influenza,inj,Quad PF,6+ Mos 02/25/2013, 02/23/2014   Influenza-Unspecified 03/15/2015   Pneumococcal Conjugate-13 02/25/2013     Objective: Vital Signs: BP 121/72 (BP Location: Left Arm, Patient Position: Sitting, Cuff Size: Small)    Pulse 83    Resp 12    Ht 5' 2.5" (1.588 m)    Wt 230 lb 3.2 oz (104.4 kg)    BMI 41.43 kg/m    Physical Exam Vitals signs and nursing note reviewed.  Constitutional:      Appearance: She is well-developed.  HENT:     Head: Normocephalic and atraumatic.  Eyes:     Conjunctiva/sclera: Conjunctivae normal.  Neck:     Musculoskeletal: Normal range of motion.  Cardiovascular:     Rate and Rhythm: Normal rate and regular rhythm.     Heart sounds: Normal heart sounds.  Pulmonary:     Effort: Pulmonary effort is normal.     Breath sounds: Normal breath sounds.  Abdominal:     General: Bowel sounds are normal.     Palpations: Abdomen is soft.  Lymphadenopathy:     Cervical: No cervical adenopathy.  Skin:    General: Skin is warm and dry.     Capillary Refill: Capillary refill takes less than 2 seconds.  Neurological:     Mental Status: She is alert and oriented to person, place, and time.  Psychiatric:        Behavior: Behavior normal.      Musculoskeletal Exam: C-spine was in good range of motion.  Shoulder joints, elbow joints, wrist joints were in good range of motion with no synovitis.  She has no synovitis over MCP joints.  PIP and DIP thickening was noted.  Hip joints and knee joints with good range of motion.  She has discomfort range of motion of her knee joints without any warmth swelling or effusion.  Ankle joints MTPs PIPs with good range of motion with no synovitis.  CDAI Exam: CDAI Score: 5.6  Patient Global: 4 mm; Provider Global: 2 mm Swollen: 0 ; Tender: 5  Joint Exam      Right  Left    MCP 1   Tender   Tender  PIP 3   Tender     PIP 4   Tender     Knee   Tender        Investigation: No additional findings.  Imaging: No results found.  Recent Labs: Lab Results  Component Value Date   WBC 7.2 02/24/2019   HGB 12.8 02/24/2019   PLT 315 02/24/2019   NA 141 02/24/2019   K 3.8 02/24/2019   CL 103 02/24/2019   CO2 27 02/24/2019   GLUCOSE 87 02/24/2019   BUN 18 02/24/2019   CREATININE 0.69 02/24/2019   BILITOT 0.4 02/24/2019   ALKPHOS 62 02/24/2019   AST 16 02/24/2019   ALT 12 02/24/2019   PROT 6.3 (L) 02/24/2019   ALBUMIN 3.7 02/24/2019   CALCIUM 9.1 02/24/2019   GFRAA >60 02/24/2019   QFTBGOLD Negative 07/24/2016   QFTBGOLDPLUS NEGATIVE 06/11/2018    Speciality Comments: PLQ eye exam: 11/20/2018 normal. [facility]. Follow up in 1 year. Orencia 1000 mg / started on 09/2016  Prior therapy includes: Simponi Aria (inadequate response)  Procedures:  No procedures performed Allergies: Arava [leflunomide] and Piroxicam   Assessment / Plan:     Visit Diagnoses: Rheumatoid arthritis with rheumatoid factor of multiple sites without organ or systems involvement (HCC)-patient has no synovitis on examination.  Although she continues to have arthralgias.  I believe some of the discomfort she is experiencing is from underlying osteoarthritis.  High risk medication use-Orencia IV 1000 mg every 28 days and Plaquenil 200 mg in the morning and 100 mg at bedtime.her labs were normal in October.  She will need labs every 3 months.   Pain in both hands -she has been experiencing increased pain and discomfort in her hands.  No synovitis was noted.  She has some DIP and PIP thickening.  Plan: XR Hand 2 View Right, XR Hand 2 View Left x-rays were consistent with osteoarthritis and rheumatoid arthritis overlap.  Erosive changes were noted in the left wrist.  Pain in both feet -she has been experiencing increased pain and discomfort in her bilateral feet.  No  synovitis was noted.  Plan: XR Foot 2 Views Right, XR Foot 2 Views Left.  X-rays were consistent with rheumatoid arthritis and osteoarthritis overlap.  Primary osteoarthritis of both hands  Primary osteoarthritis of both knees -she complains of ongoing pain and discomfort in her knee joints.  She is been taking Tylenol and ibuprofen for pain management.  Plan: XR KNEE 3 VIEW RIGHT, XR KNEE 3 VIEW LEFT.  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  She will need right total knee replacement.  She is not currently ready.  She will contact us if she needs an appointment.  Primary osteoarthritis of both feet  Osteopenia of multiple sites - She is taking a vitamin D supplement daily and eating 3 servings of dairy daily for calcium supplementation. Last DEXA on 11/06/2018 showed T-score -2.0 at lumbar spine and right hip had -11% change in BMD.  History of hyperlipidemia  History of asthma  History of depression-currently manageable per patient.  History of vitamin D deficiency-she is on vitamin D supplements.  History of hypertension-blood pressure is well controlled.    Orders: Orders Placed This Encounter  Procedures   XR Hand 2 View Right   XR Hand 2 View Left   XR KNEE 3 VIEW RIGHT   XR KNEE 3 VIEW LEFT   XR Foot 2 Views Right   XR Foot 2 Views Left   No orders of the defined types were placed in this encounter.   Face-to-face time spent with patient was 30 minutes. Greater than 50% of time was spent in counseling and coordination of care.  Follow-Up Instructions: Return in about 5 months (around 07/27/2019) for Rheumatoid arthritis, Osteoarthritis.   Pollyann Savoy, MD  Note - This record has been created using Animal nutritionist.  Chart creation errors have been sought, but may not always  have been located. Such creation errors do not reflect on  the standard of medical care.

## 2019-02-13 ENCOUNTER — Ambulatory Visit (INDEPENDENT_AMBULATORY_CARE_PROVIDER_SITE_OTHER): Payer: Medicare Other | Admitting: Internal Medicine

## 2019-02-13 ENCOUNTER — Encounter: Payer: Self-pay | Admitting: Internal Medicine

## 2019-02-13 ENCOUNTER — Other Ambulatory Visit: Payer: Self-pay

## 2019-02-13 ENCOUNTER — Other Ambulatory Visit: Payer: Self-pay | Admitting: Internal Medicine

## 2019-02-13 DIAGNOSIS — J301 Allergic rhinitis due to pollen: Secondary | ICD-10-CM

## 2019-02-13 DIAGNOSIS — Z23 Encounter for immunization: Secondary | ICD-10-CM | POA: Diagnosis not present

## 2019-02-13 DIAGNOSIS — J4530 Mild persistent asthma, uncomplicated: Secondary | ICD-10-CM | POA: Diagnosis not present

## 2019-02-13 MED ORDER — ALBUTEROL SULFATE HFA 108 (90 BASE) MCG/ACT IN AERS: INHALATION_SPRAY | RESPIRATORY_TRACT | 99 refills | Status: DC

## 2019-02-13 MED ORDER — TRELEGY ELLIPTA 100-62.5-25 MCG/INH IN AEPB: 1.0000 | INHALATION_SPRAY | Freq: Every day | RESPIRATORY_TRACT | 12 refills | Status: DC

## 2019-02-13 NOTE — Patient Instructions (Signed)
Order- flu vax senior  Refill scripts sent for Trelegy and for albuterol rescue inhaler  Please call if we can help

## 2019-02-13 NOTE — Progress Notes (Signed)
HPI female never smoker followed for asthma, allergic rhinitis, complicated by rheumatoid arthritis PFT 02/02/09-mild obstruction small airways, minimal response bronchodilator, DLCO mildly reduced. FVC 3.36/116%, FEV1 2.48/119%, ratio 0.74, FEF 25-75% 1.80/75%, TLC 108%, DLCO 70% Office Spirometry 09/08/16-WNL-FVC 2.63/98%, FEV1 1.91/95%, ratio 0.73, FEF 25-75% 1.32/82% FENO 02/12/17- 32H -------------------------------------------------------------------------------------  02/12/2018- 78 year old female never smoker followed for asthma, chronic cough, allergic rhinitis, complicated by rheumatoid arthritis -----Asthma: Pt states she has been feeling great overall since using Trelegy. Has only used Rescue inhaler 2 times this summer.  Trelegy, Ventolin HFA, She remains very pleased with Trelegy and has only needed her rescue inhaler on rare occasion.  Discussed flu vaccine.  No acute issues.  02/13/2019- 78 year old female never smoker followed for asthma, chronic cough, allergic rhinitis, complicated by rheumatoid arthritis Trelegy, Ventolin hfa -----pt states breathing is at baseline; reports seasonal allergies, taking Trelegy once daily and albuterol as needed Uses rescue inhaler < 1x/ week. Trelegy works well. Minor seasonal rhinitis this Fall.  RA is better controlled on current infusion.   ROS-see HPI    + = positive Constitutional:   No-   weight loss, night sweats, fevers, chills, fatigue, lassitude. HEENT:   No-  headaches, difficulty swallowing, tooth/dental problems, sore throat,       No-  sneezing, itching, ear ache, nasal congestion, post nasal drip,  CV:  No-   chest pain, orthopnea, PND, +swelling in lower extremities, anasarca, dizziness, palpitations Resp:   + shortness of breath with exertion or at rest.              No-   productive cough,   non-productive cough,  No- coughing up of blood.              No-   change in color of mucus.  +rare wheezing.   Skin: No-   rash or  lesions. GI:  No-   heartburn, indigestion, abdominal pain, nausea, vomiting,  GU:  MS: + joint pain or swelling.   Neuro-     nothing unusual Psych:  No- change in mood or affect. No depression or anxiety.  No memory loss.  OBJ        General- Alert, Oriented, Affect-appropriate, Distress- none acute. + Morbidly obese Skin- rash-none, lesions- none, excoriation- none Lymphadenopathy- none Head- atraumatic            Eyes- Gross vision intact, PERRLA, conjunctivae clear secretions            Ears- Hearing, canals-normal            Nose- Clear, no-Septal dev, mucus, polyps, erosion, perforation             Throat- Mallampati III , mucosa clear , drainage- none, tonsils- atrophic. +Easy gag. Neck- flexible , trachea midline, no stridor , thyroid nl, carotid no bruit Chest - symmetrical excursion , unlabored           Heart/CV- RRR , no murmur , no gallop  , no rub, nl s1 s2                           - JVD- none , edema- none, stasis changes- none, varices- none           Lung- clear, wheeze- none, cough- none , dullness-none, rub- none           Chest wall-  Abd-  Br/ Gen/ Rectal- Not done, not indicated Extrem- cyanosis- none, clubbing, none,  atrophy- none, strength- nl Neuro- grossly intact to observation

## 2019-02-16 DIAGNOSIS — J45909 Unspecified asthma, uncomplicated: Secondary | ICD-10-CM | POA: Insufficient documentation

## 2019-02-16 NOTE — Assessment & Plan Note (Signed)
Feels well controlled on Trelegy  Plan - flu vax, continue present meds

## 2019-02-16 NOTE — Assessment & Plan Note (Signed)
This Fall season has been mild Plan- Flonase, Claritin as needed

## 2019-02-24 ENCOUNTER — Encounter (HOSPITAL_COMMUNITY): Payer: Self-pay

## 2019-02-24 ENCOUNTER — Encounter (HOSPITAL_COMMUNITY)
Admission: RE | Admit: 2019-02-24 | Discharge: 2019-02-24 | Disposition: A | Discharge status: Home / Self Care | Payer: Medicare Other | Source: Ambulatory Visit | Attending: Rheumatology | Admitting: Rheumatology

## 2019-02-24 ENCOUNTER — Other Ambulatory Visit: Payer: Self-pay

## 2019-02-24 DIAGNOSIS — M0579 Rheumatoid arthritis with rheumatoid factor of multiple sites without organ or systems involvement: Secondary | ICD-10-CM | POA: Insufficient documentation

## 2019-02-24 LAB — CBC
HCT: 40.2 % (ref 36.0–46.0)
Hemoglobin: 12.8 g/dL (ref 12.0–15.0)
MCH: 31.6 pg (ref 26.0–34.0)
MCHC: 31.8 g/dL (ref 30.0–36.0)
MCV: 99.3 fL (ref 80.0–100.0)
Platelets: 315 10*3/uL (ref 150–400)
RBC: 4.05 MIL/uL (ref 3.87–5.11)
RDW: 12.7 % (ref 11.5–15.5)
WBC: 7.2 10*3/uL (ref 4.0–10.5)
nRBC: 0 % (ref 0.0–0.2)

## 2019-02-24 LAB — COMPREHENSIVE METABOLIC PANEL
ALT: 12 U/L (ref 0–44)
AST: 16 U/L (ref 15–41)
Albumin: 3.7 g/dL (ref 3.5–5.0)
Alkaline Phosphatase: 62 U/L (ref 38–126)
Anion gap: 11 (ref 5–15)
BUN: 18 mg/dL (ref 8–23)
CO2: 27 mmol/L (ref 22–32)
Calcium: 9.1 mg/dL (ref 8.9–10.3)
Chloride: 103 mmol/L (ref 98–111)
Creatinine, Ser: 0.69 mg/dL (ref 0.44–1.00)
GFR calc Af Amer: >60 mL/min (ref >60)
GFR calc non Af Amer: >60 mL/min (ref >60)
Glucose, Bld: 87 mg/dL (ref 70–99)
Potassium: 3.8 mmol/L (ref 3.5–5.1)
Sodium: 141 mmol/L (ref 135–145)
Total Bilirubin: 0.4 mg/dL (ref 0.3–1.2)
Total Protein: 6.3 g/dL — ABNORMAL LOW (ref 6.5–8.1)

## 2019-02-24 MED ORDER — SODIUM CHLORIDE 0.9 % IV SOLN
Freq: Once | INTRAVENOUS | Status: AC
  Administered 2019-02-24: 10:00:00 via INTRAVENOUS

## 2019-02-24 MED ORDER — SODIUM CHLORIDE 0.9 % IV SOLN
1000.0000 mg | Freq: Once | INTRAVENOUS | Status: AC
  Administered 2019-02-24: 1000 mg via INTRAVENOUS
  Filled 2019-02-24: qty 40

## 2019-02-26 ENCOUNTER — Ambulatory Visit (INDEPENDENT_AMBULATORY_CARE_PROVIDER_SITE_OTHER): Payer: Medicare Other

## 2019-02-26 ENCOUNTER — Ambulatory Visit: Payer: Self-pay

## 2019-02-26 ENCOUNTER — Ambulatory Visit (INDEPENDENT_AMBULATORY_CARE_PROVIDER_SITE_OTHER): Payer: Medicare Other | Admitting: Rheumatology

## 2019-02-26 ENCOUNTER — Other Ambulatory Visit: Payer: Self-pay

## 2019-02-26 ENCOUNTER — Encounter: Payer: Self-pay | Admitting: Rheumatology

## 2019-02-26 VITALS — BP 121/72 | HR 83 | Resp 12 | Ht 62.5 in | Wt 230.2 lb

## 2019-02-26 DIAGNOSIS — M170 Bilateral primary osteoarthritis of knee: Secondary | ICD-10-CM

## 2019-02-26 DIAGNOSIS — M79671 Pain in right foot: Secondary | ICD-10-CM | POA: Diagnosis not present

## 2019-02-26 DIAGNOSIS — Z8659 Personal history of other mental and behavioral disorders: Secondary | ICD-10-CM | POA: Diagnosis not present

## 2019-02-26 DIAGNOSIS — M79641 Pain in right hand: Secondary | ICD-10-CM | POA: Diagnosis not present

## 2019-02-26 DIAGNOSIS — M19072 Primary osteoarthritis, left ankle and foot: Secondary | ICD-10-CM

## 2019-02-26 DIAGNOSIS — M79642 Pain in left hand: Secondary | ICD-10-CM

## 2019-02-26 DIAGNOSIS — Z8679 Personal history of other diseases of the circulatory system: Secondary | ICD-10-CM | POA: Diagnosis not present

## 2019-02-26 DIAGNOSIS — M79672 Pain in left foot: Secondary | ICD-10-CM | POA: Diagnosis not present

## 2019-02-26 DIAGNOSIS — M19041 Primary osteoarthritis, right hand: Secondary | ICD-10-CM | POA: Diagnosis not present

## 2019-02-26 DIAGNOSIS — M0579 Rheumatoid arthritis with rheumatoid factor of multiple sites without organ or systems involvement: Secondary | ICD-10-CM

## 2019-02-26 DIAGNOSIS — Z8709 Personal history of other diseases of the respiratory system: Secondary | ICD-10-CM | POA: Diagnosis not present

## 2019-02-26 DIAGNOSIS — Z79899 Other long term (current) drug therapy: Secondary | ICD-10-CM

## 2019-02-26 DIAGNOSIS — M8589 Other specified disorders of bone density and structure, multiple sites: Secondary | ICD-10-CM

## 2019-02-26 DIAGNOSIS — M19071 Primary osteoarthritis, right ankle and foot: Secondary | ICD-10-CM | POA: Diagnosis not present

## 2019-02-26 DIAGNOSIS — Z8639 Personal history of other endocrine, nutritional and metabolic disease: Secondary | ICD-10-CM

## 2019-02-26 DIAGNOSIS — M19042 Primary osteoarthritis, left hand: Secondary | ICD-10-CM

## 2019-03-13 ENCOUNTER — Other Ambulatory Visit: Payer: Self-pay | Admitting: *Deleted

## 2019-03-13 DIAGNOSIS — M0579 Rheumatoid arthritis with rheumatoid factor of multiple sites without organ or systems involvement: Secondary | ICD-10-CM

## 2019-03-24 ENCOUNTER — Encounter (HOSPITAL_COMMUNITY)
Admission: RE | Admit: 2019-03-24 | Discharge: 2019-03-24 | Disposition: A | Discharge status: Home / Self Care | Payer: Medicare Other | Source: Ambulatory Visit | Attending: Rheumatology | Admitting: Rheumatology

## 2019-03-24 ENCOUNTER — Other Ambulatory Visit: Payer: Self-pay

## 2019-03-24 DIAGNOSIS — M0579 Rheumatoid arthritis with rheumatoid factor of multiple sites without organ or systems involvement: Secondary | ICD-10-CM | POA: Diagnosis not present

## 2019-03-24 MED ORDER — SODIUM CHLORIDE 0.9 % IV SOLN
1000.0000 mg | INTRAVENOUS | Status: DC
  Administered 2019-03-24: 1000 mg via INTRAVENOUS
  Filled 2019-03-24: qty 40

## 2019-03-24 MED ORDER — DIPHENHYDRAMINE HCL 25 MG PO CAPS: 25.0000 mg | ORAL_CAPSULE | ORAL | Status: DC

## 2019-03-24 MED ORDER — ACETAMINOPHEN 325 MG PO TABS: 650.0000 mg | ORAL_TABLET | ORAL | Status: DC

## 2019-04-07 ENCOUNTER — Other Ambulatory Visit: Payer: Self-pay | Admitting: Rheumatology

## 2019-04-07 DIAGNOSIS — M0579 Rheumatoid arthritis with rheumatoid factor of multiple sites without organ or systems involvement: Secondary | ICD-10-CM

## 2019-04-08 NOTE — Telephone Encounter (Signed)
Last Visit: 02/26/2019 Next Visit: 07/30/2019 Labs: 02/24/2019 Total protein mildly low. Rest of CMP WNL. CBC WNL. Eye exam: 11/20/2018   Okay to refill per Dr. Estanislado Pandy.

## 2019-04-21 ENCOUNTER — Encounter (HOSPITAL_COMMUNITY): Payer: Self-pay

## 2019-04-21 ENCOUNTER — Other Ambulatory Visit: Payer: Self-pay

## 2019-04-21 ENCOUNTER — Encounter (HOSPITAL_COMMUNITY): Payer: Medicare Other

## 2019-04-21 ENCOUNTER — Encounter (HOSPITAL_COMMUNITY)
Admission: RE | Admit: 2019-04-21 | Discharge: 2019-04-21 | Disposition: A | Discharge status: Home / Self Care | Payer: Medicare Other | Source: Ambulatory Visit | Attending: Rheumatology | Admitting: Rheumatology

## 2019-04-21 DIAGNOSIS — M069 Rheumatoid arthritis, unspecified: Secondary | ICD-10-CM | POA: Diagnosis present

## 2019-04-21 LAB — COMPREHENSIVE METABOLIC PANEL
ALT: 12 U/L (ref 0–44)
AST: 16 U/L (ref 15–41)
Albumin: 4.1 g/dL (ref 3.5–5.0)
Alkaline Phosphatase: 60 U/L (ref 38–126)
Anion gap: 10 (ref 5–15)
BUN: 18 mg/dL (ref 8–23)
CO2: 28 mmol/L (ref 22–32)
Calcium: 9.4 mg/dL (ref 8.9–10.3)
Chloride: 102 mmol/L (ref 98–111)
Creatinine, Ser: 0.77 mg/dL (ref 0.44–1.00)
GFR calc Af Amer: >60 mL/min (ref >60)
GFR calc non Af Amer: >60 mL/min (ref >60)
Glucose, Bld: 100 mg/dL — ABNORMAL HIGH (ref 70–99)
Potassium: 3.4 mmol/L — ABNORMAL LOW (ref 3.5–5.1)
Sodium: 140 mmol/L (ref 135–145)
Total Bilirubin: 0.8 mg/dL (ref 0.3–1.2)
Total Protein: 7.1 g/dL (ref 6.5–8.1)

## 2019-04-21 LAB — CBC
HCT: 40.3 % (ref 36.0–46.0)
Hemoglobin: 13.1 g/dL (ref 12.0–15.0)
MCH: 32.3 pg (ref 26.0–34.0)
MCHC: 32.5 g/dL (ref 30.0–36.0)
MCV: 99.3 fL (ref 80.0–100.0)
Platelets: 293 10*3/uL (ref 150–400)
RBC: 4.06 MIL/uL (ref 3.87–5.11)
RDW: 12.8 % (ref 11.5–15.5)
WBC: 6.4 10*3/uL (ref 4.0–10.5)
nRBC: 0 % (ref 0.0–0.2)

## 2019-04-21 MED ORDER — SODIUM CHLORIDE 0.9 % IV SOLN
1000.0000 mg | Freq: Once | INTRAVENOUS | Status: AC
  Administered 2019-04-21: 1000 mg via INTRAVENOUS
  Filled 2019-04-21: qty 40

## 2019-04-21 MED ORDER — SODIUM CHLORIDE 0.9 % IV SOLN
Freq: Once | INTRAVENOUS | Status: AC
  Administered 2019-04-21: 10:00:00 via INTRAVENOUS

## 2019-04-21 NOTE — Progress Notes (Signed)
CBC and CMP are normal except potassium is low.  Please notify patient and fax the results to her PCP.

## 2019-05-19 ENCOUNTER — Other Ambulatory Visit: Payer: Self-pay

## 2019-05-19 ENCOUNTER — Encounter (HOSPITAL_COMMUNITY): Payer: Self-pay

## 2019-05-19 ENCOUNTER — Encounter (HOSPITAL_COMMUNITY)
Admission: RE | Admit: 2019-05-19 | Discharge: 2019-05-19 | Disposition: A | Discharge status: Home / Self Care | Payer: Medicare Other | Source: Ambulatory Visit | Attending: Rheumatology | Admitting: Rheumatology

## 2019-05-19 DIAGNOSIS — M0579 Rheumatoid arthritis with rheumatoid factor of multiple sites without organ or systems involvement: Secondary | ICD-10-CM | POA: Insufficient documentation

## 2019-05-19 MED ORDER — SODIUM CHLORIDE 0.9 % IV SOLN
1000.0000 mg | Freq: Once | INTRAVENOUS | Status: AC
  Administered 2019-05-19: 1000 mg via INTRAVENOUS
  Filled 2019-05-19: qty 40

## 2019-05-19 MED ORDER — SODIUM CHLORIDE 0.9 % IV SOLN: Freq: Once | INTRAVENOUS | Status: AC

## 2019-05-20 ENCOUNTER — Other Ambulatory Visit: Payer: Self-pay | Admitting: Rheumatology

## 2019-05-20 NOTE — Telephone Encounter (Signed)
Last Visit: 02/26/2019 Next Visit: 07/30/2019  Okay to refill per Dr. Corliss Skains.

## 2019-06-16 ENCOUNTER — Encounter (HOSPITAL_COMMUNITY): Payer: Self-pay

## 2019-06-16 ENCOUNTER — Other Ambulatory Visit: Payer: Self-pay

## 2019-06-16 ENCOUNTER — Encounter (HOSPITAL_COMMUNITY)
Admission: RE | Admit: 2019-06-16 | Discharge: 2019-06-16 | Disposition: A | Discharge status: Home / Self Care | Payer: Medicare Other | Source: Ambulatory Visit | Attending: Rheumatology | Admitting: Rheumatology

## 2019-06-16 DIAGNOSIS — M0579 Rheumatoid arthritis with rheumatoid factor of multiple sites without organ or systems involvement: Secondary | ICD-10-CM | POA: Diagnosis not present

## 2019-06-16 LAB — COMPREHENSIVE METABOLIC PANEL
ALT: 12 U/L (ref 0–44)
AST: 16 U/L (ref 15–41)
Albumin: 3.9 g/dL (ref 3.5–5.0)
Alkaline Phosphatase: 60 U/L (ref 38–126)
Anion gap: 10 (ref 5–15)
BUN: 13 mg/dL (ref 8–23)
CO2: 26 mmol/L (ref 22–32)
Calcium: 8.9 mg/dL (ref 8.9–10.3)
Chloride: 103 mmol/L (ref 98–111)
Creatinine, Ser: 0.75 mg/dL (ref 0.44–1.00)
GFR calc Af Amer: >60 mL/min (ref >60)
GFR calc non Af Amer: >60 mL/min (ref >60)
Glucose, Bld: 91 mg/dL (ref 70–99)
Potassium: 4 mmol/L (ref 3.5–5.1)
Sodium: 139 mmol/L (ref 135–145)
Total Bilirubin: 0.7 mg/dL (ref 0.3–1.2)
Total Protein: 6.9 g/dL (ref 6.5–8.1)

## 2019-06-16 LAB — CBC
HCT: 41.3 % (ref 36.0–46.0)
Hemoglobin: 13.3 g/dL (ref 12.0–15.0)
MCH: 32 pg (ref 26.0–34.0)
MCHC: 32.2 g/dL (ref 30.0–36.0)
MCV: 99.5 fL (ref 80.0–100.0)
Platelets: 313 10*3/uL (ref 150–400)
RBC: 4.15 MIL/uL (ref 3.87–5.11)
RDW: 12.5 % (ref 11.5–15.5)
WBC: 6.8 10*3/uL (ref 4.0–10.5)
nRBC: 0 % (ref 0.0–0.2)

## 2019-06-16 MED ORDER — SODIUM CHLORIDE 0.9 % IV SOLN
1000.0000 mg | Freq: Once | INTRAVENOUS | Status: AC
  Administered 2019-06-16: 11:00:00 1000 mg via INTRAVENOUS
  Filled 2019-06-16: qty 40

## 2019-06-16 MED ORDER — SODIUM CHLORIDE 0.9 % IV SOLN: Freq: Once | INTRAVENOUS | Status: AC

## 2019-06-17 ENCOUNTER — Encounter (HOSPITAL_COMMUNITY): Payer: Medicare Other

## 2019-06-30 DIAGNOSIS — M069 Rheumatoid arthritis, unspecified: Secondary | ICD-10-CM | POA: Diagnosis not present

## 2019-06-30 DIAGNOSIS — I10 Essential (primary) hypertension: Secondary | ICD-10-CM | POA: Diagnosis not present

## 2019-06-30 DIAGNOSIS — E7800 Pure hypercholesterolemia, unspecified: Secondary | ICD-10-CM | POA: Diagnosis not present

## 2019-06-30 DIAGNOSIS — J45909 Unspecified asthma, uncomplicated: Secondary | ICD-10-CM | POA: Diagnosis not present

## 2019-06-30 DIAGNOSIS — F339 Major depressive disorder, recurrent, unspecified: Secondary | ICD-10-CM | POA: Diagnosis not present

## 2019-07-07 ENCOUNTER — Other Ambulatory Visit: Payer: Self-pay | Admitting: Rheumatology

## 2019-07-07 ENCOUNTER — Other Ambulatory Visit: Payer: Self-pay | Admitting: Physician Assistant

## 2019-07-07 DIAGNOSIS — M0579 Rheumatoid arthritis with rheumatoid factor of multiple sites without organ or systems involvement: Secondary | ICD-10-CM

## 2019-07-07 NOTE — Telephone Encounter (Signed)
Last Visit:02/26/2019 Next Visit:07/30/2019 Labs: 06/16/19 WNL PLQ eye exam: 11/20/2018 normal.  Okay to refillper Dr. Corliss Skains.

## 2019-07-07 NOTE — Telephone Encounter (Signed)
Last Visit: 02/26/2019 Next Visit: 07/30/2019  Okay to refill per Dr. Deveshwar.  

## 2019-07-10 NOTE — Telephone Encounter (Signed)
Yes- get a covid vaccine- whichever and whenever available to you.

## 2019-07-10 NOTE — Telephone Encounter (Signed)
Patient last seen 10.8.2020 for yearly follow up: Asthmatic bronchitis - Waymon Budge, MD at 02/16/2019 7:28 PM  Status: Written  Related Problem: Asthmatic bronchitis    Feels well controlled on Trelegy  Plan - flu vax, continue present meds    Seasonal allergic rhinitis - Waymon Budge, MD at 02/16/2019 7:29 PM  Status: Written  Related Problem: Seasonal allergic rhinitis    This Fall season has been mild Plan- Flonase, Claritin as needed       Dr Maple Hudson, patient is asking if you believe she should make an appt to receive her COVID vaccine.  Please advise, thank you.

## 2019-07-14 ENCOUNTER — Encounter (HOSPITAL_COMMUNITY)
Admission: RE | Admit: 2019-07-14 | Discharge: 2019-07-14 | Disposition: A | Discharge status: Home / Self Care | Payer: Medicare Other | Source: Ambulatory Visit | Attending: Rheumatology | Admitting: Rheumatology

## 2019-07-14 ENCOUNTER — Other Ambulatory Visit: Payer: Self-pay

## 2019-07-14 ENCOUNTER — Encounter (HOSPITAL_COMMUNITY): Payer: Self-pay

## 2019-07-14 DIAGNOSIS — M069 Rheumatoid arthritis, unspecified: Secondary | ICD-10-CM | POA: Insufficient documentation

## 2019-07-14 MED ORDER — SODIUM CHLORIDE 0.9 % IV SOLN
1000.0000 mg | Freq: Once | INTRAVENOUS | Status: AC
  Administered 2019-07-14: 1000 mg via INTRAVENOUS
  Filled 2019-07-14: qty 40

## 2019-07-14 MED ORDER — ACETAMINOPHEN 325 MG PO TABS: 650.0000 mg | ORAL_TABLET | Freq: Once | ORAL | Status: DC

## 2019-07-14 MED ORDER — SODIUM CHLORIDE 0.9 % IV SOLN: Freq: Once | INTRAVENOUS | Status: AC

## 2019-07-14 MED ORDER — DIPHENHYDRAMINE HCL 25 MG PO CAPS: 25.0000 mg | ORAL_CAPSULE | Freq: Once | ORAL | Status: DC

## 2019-07-16 DIAGNOSIS — Z23 Encounter for immunization: Secondary | ICD-10-CM | POA: Diagnosis not present

## 2019-07-21 ENCOUNTER — Telehealth: Payer: Self-pay | Admitting: *Deleted

## 2019-07-21 NOTE — Progress Notes (Signed)
Office Visit Note  Patient: Monica [name]             Date of Birth: 11/16/1940           MRN: [id number]             PCP: Juluis Rainier, MD Referring: Juluis Rainier, MD Visit Date: 07/30/2019 Occupation: @GUAROCC @  Subjective:  Medication monitoring   History of Present Illness: Monica [name] is a 79 y.o. female with history of rheumatoid arthritis and osteoarthritis overlap.  She states she has been taking Orencia infusions on a monthly basis and taking Plaquenil every day.  She has not noticed much joint flares.  She states she has been having pain mostly in her bilateral thumb which she points at the [facility] joints.  She has occasional discomfort in her right knee joint.  Activities of Daily Living:  Patient reports morning stiffness for 2 hours.   Patient Denies nocturnal pain.  Difficulty dressing/grooming: Denies Difficulty climbing stairs: Reports Difficulty getting out of chair: Reports Difficulty using hands for taps, buttons, cutlery, and/or writing: Reports  Review of Systems  Constitutional: Positive for fatigue. Negative for night sweats, weight gain and weight loss.  HENT: Positive for mouth dryness. Negative for mouth sores, trouble swallowing, trouble swallowing and nose dryness.   Eyes: Positive for dryness. Negative for pain, redness and visual disturbance.  Respiratory: Negative for cough, shortness of breath and difficulty breathing.   Cardiovascular: Negative for chest pain, palpitations, hypertension, irregular heartbeat and swelling in legs/feet.  Gastrointestinal: Negative for blood in stool, constipation and diarrhea.  Endocrine: Positive for cold intolerance. Negative for increased urination.  Genitourinary: Negative for difficulty urinating and vaginal dryness.  Musculoskeletal: Positive for arthralgias, joint pain, morning stiffness and muscle tenderness. Negative for joint swelling, myalgias, muscle weakness and myalgias.  Skin: Negative for  color change, rash, hair loss, skin tightness, ulcers and sensitivity to sunlight.  Allergic/Immunologic: Negative for susceptible to infections.  Neurological: Positive for weakness. Negative for dizziness, memory loss and night sweats.  Hematological: Positive for bruising/bleeding tendency. Negative for swollen glands.  Psychiatric/Behavioral: Positive for sleep disturbance. Negative for depressed mood. The patient is not nervous/anxious.     PMFS History:  Patient Active Problem List   Diagnosis Date Noted  . Asthmatic bronchitis 02/16/2019  . Osteopenia 12/20/2016  . History of depression 12/11/2016  . High risk medication use 05/15/2016  . Primary osteoarthritis of both hands 05/15/2016  . Primary osteoarthritis of both feet 05/15/2016  . Primary osteoarthritis of both knees 05/15/2016  . Vitamin D deficiency 05/15/2016  . History of hypertension 05/15/2016  . History of hyperlipidemia 05/15/2016  . Rheumatoid arthritis with rheumatoid factor of multiple sites without organ or systems involvement (HCC) 01/10/2016  . Seasonal allergic rhinitis 10/03/2011  . HYPERLIPIDEMIA 12/31/2008  . HYPERTENSION 12/31/2008  . Mild persistent asthmatic bronchitis with exacerbation 12/31/2008    Past Medical History:  Diagnosis Date  . Arthritis   . 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 Hyperlipidemia   . Hypertension     Family History  Problem Relation Age of Onset  . Lung cancer Father   . Alzheimer's disease Mother    Past Surgical History:  Procedure Laterality Date  . CARPAL TUNNEL RELEASE    . FOOT SURGERY     Social History   Social History Narrative  . Not on file   Immunization History  Administered Date(s) Administered  . Fluad Quad(high  Dose 65+) 02/13/2019  . Influenza Nasal 02/29/2012  . Influenza Split 03/02/2011  . Influenza, High Dose Seasonal PF 02/12/2017, 02/12/2018  . Influenza,inj,Quad PF,6+ Mos 02/25/2013, 02/23/2014  .  Influenza-Unspecified 03/15/2015  . Pneumococcal Conjugate-13 02/25/2013     Objective: Vital Signs: BP 130/76 (BP Location: Left Arm, Patient Position: Sitting, Cuff Size: Normal)   Pulse 80   Resp 16   Ht 5' 2.75" (1.594 m)   Wt 231 lb (104.8 kg)   BMI 41.25 kg/m    Physical Exam Vitals and nursing note reviewed.  Constitutional:      Appearance: She is well-developed.  HENT:     Head: Normocephalic and atraumatic.  Eyes:     Conjunctiva/sclera: Conjunctivae normal.  Cardiovascular:     Rate and Rhythm: Normal rate and regular rhythm.     Heart sounds: Normal heart sounds.  Pulmonary:     Effort: Pulmonary effort is normal.     Breath sounds: Normal breath sounds.  Abdominal:     General: Bowel sounds are normal.     Palpations: Abdomen is soft.  Musculoskeletal:     Cervical back: Normal range of motion.  Lymphadenopathy:     Cervical: No cervical adenopathy.  Skin:    General: Skin is warm and dry.     Capillary Refill: Capillary refill takes less than 2 seconds.  Neurological:     Mental Status: She is alert and oriented to person, place, and time.  Psychiatric:        Behavior: Behavior normal.      Musculoskeletal Exam: C-spine was in good range of motion.  Shoulder joints, elbow joints with good range of motion.  She had no synovitis over her wrist joints with full range of motion.  She has thickening of bilateral CMC PIP and DIP joints.  No synovitis was noted over MCP joints.  Hip joints, knee joints, ankles with good range of motion.  She had no tenderness across her MTPs.  CDAI Exam: CDAI Score: 0.4  Patient Global: 2 mm; Provider Global: 2 mm Swollen: 0 ; Tender: 0  Joint Exam 07/30/2019   No joint exam has been documented for this visit   There is currently no information documented on the homunculus. Go to the Rheumatology activity and complete the homunculus joint exam.  Investigation: No additional findings.  Imaging: No results  found.  Recent Labs: Lab Results  Component Value Date   WBC 6.8 06/16/2019   HGB 13.3 06/16/2019   PLT 313 06/16/2019   NA 139 06/16/2019   K 4.0 06/16/2019   CL 103 06/16/2019   CO2 26 06/16/2019   GLUCOSE 91 06/16/2019   BUN 13 06/16/2019   CREATININE 0.75 06/16/2019   BILITOT 0.7 06/16/2019   ALKPHOS 60 06/16/2019   AST 16 06/16/2019   ALT 12 06/16/2019   PROT 6.9 06/16/2019   ALBUMIN 3.9 06/16/2019   CALCIUM 8.9 06/16/2019   GFRAA >60 06/16/2019   QFTBGOLD Negative 07/24/2016   QFTBGOLDPLUS NEGATIVE 06/11/2018    Speciality Comments: PLQ eye exam: 11/20/2018 normal. [facility]. Follow up in 1 year. Orencia 1000 mg / started on 09/2016  Prior therapy includes: Simponi Aria (inadequate response)  Procedures:  No procedures performed Allergies: Arava [leflunomide] and Piroxicam   Assessment / Plan:     Visit Diagnoses: Rheumatoid arthritis with rheumatoid factor of multiple sites without organ or systems involvement (HCC)-rheumatoid arthritis very well controlled with Orencia and Plaquenil combination.  She had no synovitis on examination.  High risk medication use - Orencia IV 1000 mg every 28 days and Plaquenil 200 mg in the morning and 100 mg at bedtime. eye exam: 11/20/2018  -her labs have been stable which has been drawn with infusions.  Plan: QuantiFERON-TB Gold Plus today.  Primary osteoarthritis of both hands - x-rays were consistent with osteoarthritis and rheumatoid arthritis overlap.  Erosive changes were noted in the left wrist in October 2020.  Joint protection was discussed.  Primary osteoarthritis of both knees - X-ray showed severe osteoarthritis and severe chondromalacia patella at the last visit.  She continues to have discomfort in her knee joint.  Primary osteoarthritis of both feet - X-rays were consistent with rheumatoid arthritis and osteoarthritis overlap.  She has dorsal spurs.  Osteopenia of multiple sites - Last DEXA on 11/06/2018  showed T-score -2.0 at lumbar spine and right hip had -11% change in BMD.  Calcium, vitamin D and resistive exercise were discussed.  Other medical problems are listed as follows:  History of hyperlipidemia  History of depression  History of vitamin D deficiency  History of hypertension  History of asthma  Orders: Orders Placed This Encounter  Procedures  . QuantiFERON-TB Gold Plus   No orders of the defined types were placed in this encounter.     Follow-Up Instructions: Return in about 5 months (around 12/30/2019) for Rheumatoid arthritis.   Pollyann Savoy, MD  Note - This record has been created using Animal nutritionist.  Chart creation errors have been sought, but may not always  have been located. Such creation errors do not reflect on  the standard of medical care.

## 2019-07-21 NOTE — Telephone Encounter (Signed)
Submitted a Prior Authorization request to [employer] for Diclofenac Gel. via Cover My Meds. Will update once we receive a response. ° ° ° ° ° °

## 2019-07-22 ENCOUNTER — Telehealth: Payer: Self-pay | Admitting: Rheumatology

## 2019-07-22 NOTE — Telephone Encounter (Signed)
Noted  

## 2019-07-22 NOTE — Telephone Encounter (Signed)
[employer] called stating patient's Diclofenac Gel has been approved for 1 year (07/21/2019 to 07/20/2020).

## 2019-07-30 ENCOUNTER — Ambulatory Visit (INDEPENDENT_AMBULATORY_CARE_PROVIDER_SITE_OTHER): Payer: Medicare Other | Admitting: Rheumatology

## 2019-07-30 ENCOUNTER — Other Ambulatory Visit: Payer: Self-pay

## 2019-07-30 ENCOUNTER — Encounter: Payer: Self-pay | Admitting: Rheumatology

## 2019-07-30 VITALS — BP 130/76 | HR 80 | Resp 16 | Ht 62.75 in | Wt 231.0 lb

## 2019-07-30 DIAGNOSIS — Z8659 Personal history of other mental and behavioral disorders: Secondary | ICD-10-CM

## 2019-07-30 DIAGNOSIS — M19041 Primary osteoarthritis, right hand: Secondary | ICD-10-CM | POA: Diagnosis not present

## 2019-07-30 DIAGNOSIS — Z8709 Personal history of other diseases of the respiratory system: Secondary | ICD-10-CM | POA: Diagnosis not present

## 2019-07-30 DIAGNOSIS — M19071 Primary osteoarthritis, right ankle and foot: Secondary | ICD-10-CM | POA: Diagnosis not present

## 2019-07-30 DIAGNOSIS — Z8639 Personal history of other endocrine, nutritional and metabolic disease: Secondary | ICD-10-CM

## 2019-07-30 DIAGNOSIS — M8589 Other specified disorders of bone density and structure, multiple sites: Secondary | ICD-10-CM | POA: Diagnosis not present

## 2019-07-30 DIAGNOSIS — Z79899 Other long term (current) drug therapy: Secondary | ICD-10-CM | POA: Diagnosis not present

## 2019-07-30 DIAGNOSIS — M170 Bilateral primary osteoarthritis of knee: Secondary | ICD-10-CM

## 2019-07-30 DIAGNOSIS — M19072 Primary osteoarthritis, left ankle and foot: Secondary | ICD-10-CM

## 2019-07-30 DIAGNOSIS — M0579 Rheumatoid arthritis with rheumatoid factor of multiple sites without organ or systems involvement: Secondary | ICD-10-CM | POA: Diagnosis not present

## 2019-07-30 DIAGNOSIS — M19042 Primary osteoarthritis, left hand: Secondary | ICD-10-CM

## 2019-07-30 DIAGNOSIS — Z8679 Personal history of other diseases of the circulatory system: Secondary | ICD-10-CM

## 2019-08-01 LAB — QUANTIFERON-TB GOLD PLUS
Mitogen-NIL: >10 IU/mL
NIL: 0.04 IU/mL
QuantiFERON-TB Gold Plus: NEGATIVE
TB1-NIL: 0 IU/mL
TB2-NIL: 0 IU/mL

## 2019-08-03 NOTE — Progress Notes (Signed)
TB gold negative

## 2019-08-07 ENCOUNTER — Other Ambulatory Visit: Payer: Self-pay | Admitting: *Deleted

## 2019-08-07 DIAGNOSIS — M0579 Rheumatoid arthritis with rheumatoid factor of multiple sites without organ or systems involvement: Secondary | ICD-10-CM

## 2019-08-07 NOTE — Progress Notes (Signed)
Infusion orders are current for patient CBC CMP Tylenol Benadryl appointments are up to date and follow up appointment  is scheduled TB gold not due yet.  

## 2019-08-11 ENCOUNTER — Encounter (HOSPITAL_COMMUNITY): Payer: Self-pay

## 2019-08-11 ENCOUNTER — Encounter (HOSPITAL_COMMUNITY)
Admission: RE | Admit: 2019-08-11 | Discharge: 2019-08-11 | Disposition: A | Discharge status: Home / Self Care | Payer: Medicare Other | Source: Ambulatory Visit | Attending: Rheumatology | Admitting: Rheumatology

## 2019-08-11 ENCOUNTER — Other Ambulatory Visit: Payer: Self-pay

## 2019-08-11 DIAGNOSIS — M0579 Rheumatoid arthritis with rheumatoid factor of multiple sites without organ or systems involvement: Secondary | ICD-10-CM

## 2019-08-11 LAB — COMPREHENSIVE METABOLIC PANEL
ALT: 12 U/L (ref 0–44)
AST: 17 U/L (ref 15–41)
Albumin: 3.8 g/dL (ref 3.5–5.0)
Alkaline Phosphatase: 58 U/L (ref 38–126)
Anion gap: 10 (ref 5–15)
BUN: 13 mg/dL (ref 8–23)
CO2: 27 mmol/L (ref 22–32)
Calcium: 9.1 mg/dL (ref 8.9–10.3)
Chloride: 104 mmol/L (ref 98–111)
Creatinine, Ser: 0.7 mg/dL (ref 0.44–1.00)
GFR calc Af Amer: >60 mL/min (ref >60)
GFR calc non Af Amer: >60 mL/min (ref >60)
Glucose, Bld: 77 mg/dL (ref 70–99)
Potassium: 3.4 mmol/L — ABNORMAL LOW (ref 3.5–5.1)
Sodium: 141 mmol/L (ref 135–145)
Total Bilirubin: 0.5 mg/dL (ref 0.3–1.2)
Total Protein: 6.7 g/dL (ref 6.5–8.1)

## 2019-08-11 LAB — CBC
HCT: 39.9 % (ref 36.0–46.0)
Hemoglobin: 13.2 g/dL (ref 12.0–15.0)
MCH: 32.4 pg (ref 26.0–34.0)
MCHC: 33.1 g/dL (ref 30.0–36.0)
MCV: 97.8 fL (ref 80.0–100.0)
Platelets: 307 10*3/uL (ref 150–400)
RBC: 4.08 MIL/uL (ref 3.87–5.11)
RDW: 12.8 % (ref 11.5–15.5)
WBC: 6.2 10*3/uL (ref 4.0–10.5)
nRBC: 0 % (ref 0.0–0.2)

## 2019-08-11 MED ORDER — SODIUM CHLORIDE 0.9 % IV SOLN
1000.0000 mg | Freq: Once | INTRAVENOUS | Status: AC
  Administered 2019-08-11: 1000 mg via INTRAVENOUS
  Filled 2019-08-11: qty 40

## 2019-08-11 MED ORDER — ACETAMINOPHEN 325 MG PO TABS: 650.0000 mg | ORAL_TABLET | ORAL | Status: DC

## 2019-08-11 MED ORDER — SODIUM CHLORIDE 0.9 % IV SOLN: Freq: Once | INTRAVENOUS | Status: AC

## 2019-08-11 MED ORDER — DIPHENHYDRAMINE HCL 25 MG PO CAPS: 25.0000 mg | ORAL_CAPSULE | ORAL | Status: DC

## 2019-08-11 NOTE — Progress Notes (Signed)
Potassium is low.  Please notify patient and forward results to her PCP.

## 2019-08-13 DIAGNOSIS — Z23 Encounter for immunization: Secondary | ICD-10-CM | POA: Diagnosis not present

## 2019-08-18 ENCOUNTER — Other Ambulatory Visit: Payer: Self-pay | Admitting: Rheumatology

## 2019-08-19 ENCOUNTER — Telehealth: Payer: Self-pay | Admitting: Rheumatology

## 2019-08-19 NOTE — Telephone Encounter (Signed)
Patient advised as she is no longer on MTX she does not need to take the Folic acid. Patient verbalized understanding.

## 2019-08-19 NOTE — Telephone Encounter (Signed)
Patient called stating [facility] told her Dr. Corliss Skains denied her prescription refill of Folic Acid and that she needed to contact the office.  Patient states she just had an appointment on 07/30/19 and had labwork on 08/11/19.  Patient is requesting a return call.

## 2019-08-22 DIAGNOSIS — J45909 Unspecified asthma, uncomplicated: Secondary | ICD-10-CM | POA: Diagnosis not present

## 2019-08-22 DIAGNOSIS — F339 Major depressive disorder, recurrent, unspecified: Secondary | ICD-10-CM | POA: Diagnosis not present

## 2019-08-22 DIAGNOSIS — E7800 Pure hypercholesterolemia, unspecified: Secondary | ICD-10-CM | POA: Diagnosis not present

## 2019-08-22 DIAGNOSIS — I10 Essential (primary) hypertension: Secondary | ICD-10-CM | POA: Diagnosis not present

## 2019-08-22 DIAGNOSIS — M069 Rheumatoid arthritis, unspecified: Secondary | ICD-10-CM | POA: Diagnosis not present

## 2019-09-08 ENCOUNTER — Encounter (HOSPITAL_COMMUNITY): Payer: Self-pay

## 2019-09-08 ENCOUNTER — Encounter (HOSPITAL_COMMUNITY)
Admission: RE | Admit: 2019-09-08 | Discharge: 2019-09-08 | Disposition: A | Discharge status: Home / Self Care | Payer: Medicare Other | Source: Ambulatory Visit | Attending: Rheumatology | Admitting: Rheumatology

## 2019-09-08 ENCOUNTER — Other Ambulatory Visit: Payer: Self-pay

## 2019-09-08 DIAGNOSIS — M0579 Rheumatoid arthritis with rheumatoid factor of multiple sites without organ or systems involvement: Secondary | ICD-10-CM | POA: Insufficient documentation

## 2019-09-08 MED ORDER — SODIUM CHLORIDE 0.9 % IV SOLN: Freq: Once | INTRAVENOUS | Status: AC

## 2019-09-08 MED ORDER — SODIUM CHLORIDE 0.9 % IV SOLN
1000.0000 mg | Freq: Once | INTRAVENOUS | Status: AC
  Administered 2019-09-08: 1000 mg via INTRAVENOUS
  Filled 2019-09-08: qty 40

## 2019-09-29 DIAGNOSIS — F339 Major depressive disorder, recurrent, unspecified: Secondary | ICD-10-CM | POA: Diagnosis not present

## 2019-09-29 DIAGNOSIS — M069 Rheumatoid arthritis, unspecified: Secondary | ICD-10-CM | POA: Diagnosis not present

## 2019-09-29 DIAGNOSIS — E7800 Pure hypercholesterolemia, unspecified: Secondary | ICD-10-CM | POA: Diagnosis not present

## 2019-09-29 DIAGNOSIS — J45909 Unspecified asthma, uncomplicated: Secondary | ICD-10-CM | POA: Diagnosis not present

## 2019-09-29 DIAGNOSIS — I10 Essential (primary) hypertension: Secondary | ICD-10-CM | POA: Diagnosis not present

## 2019-10-07 ENCOUNTER — Other Ambulatory Visit: Payer: Self-pay | Admitting: Rheumatology

## 2019-10-07 ENCOUNTER — Encounter (HOSPITAL_COMMUNITY)
Admission: RE | Admit: 2019-10-07 | Discharge: 2019-10-07 | Disposition: A | Discharge status: Home / Self Care | Payer: Medicare Other | Source: Ambulatory Visit | Attending: Rheumatology | Admitting: Rheumatology

## 2019-10-07 ENCOUNTER — Other Ambulatory Visit: Payer: Self-pay

## 2019-10-07 ENCOUNTER — Encounter (HOSPITAL_COMMUNITY): Payer: Self-pay

## 2019-10-07 DIAGNOSIS — M0579 Rheumatoid arthritis with rheumatoid factor of multiple sites without organ or systems involvement: Secondary | ICD-10-CM

## 2019-10-07 LAB — COMPREHENSIVE METABOLIC PANEL
ALT: 12 U/L (ref 0–44)
AST: 18 U/L (ref 15–41)
Albumin: 3.9 g/dL (ref 3.5–5.0)
Alkaline Phosphatase: 67 U/L (ref 38–126)
Anion gap: 10 (ref 5–15)
BUN: 15 mg/dL (ref 8–23)
CO2: 28 mmol/L (ref 22–32)
Calcium: 9.2 mg/dL (ref 8.9–10.3)
Chloride: 102 mmol/L (ref 98–111)
Creatinine, Ser: 0.71 mg/dL (ref 0.44–1.00)
GFR calc Af Amer: >60 mL/min (ref >60)
GFR calc non Af Amer: >60 mL/min (ref >60)
Glucose, Bld: 90 mg/dL (ref 70–99)
Potassium: 3.9 mmol/L (ref 3.5–5.1)
Sodium: 140 mmol/L (ref 135–145)
Total Bilirubin: 0.5 mg/dL (ref 0.3–1.2)
Total Protein: 7.1 g/dL (ref 6.5–8.1)

## 2019-10-07 LAB — CBC
HCT: 40.9 % (ref 36.0–46.0)
Hemoglobin: 13.5 g/dL (ref 12.0–15.0)
MCH: 32.8 pg (ref 26.0–34.0)
MCHC: 33 g/dL (ref 30.0–36.0)
MCV: 99.3 fL (ref 80.0–100.0)
Platelets: 314 10*3/uL (ref 150–400)
RBC: 4.12 MIL/uL (ref 3.87–5.11)
RDW: 12.9 % (ref 11.5–15.5)
WBC: 6.8 10*3/uL (ref 4.0–10.5)
nRBC: 0 % (ref 0.0–0.2)

## 2019-10-07 MED ORDER — SODIUM CHLORIDE 0.9 % IV SOLN
1000.0000 mg | INTRAVENOUS | Status: DC
  Administered 2019-10-07: 1000 mg via INTRAVENOUS
  Filled 2019-10-07: qty 40

## 2019-10-07 MED ORDER — SODIUM CHLORIDE 0.9 % IV SOLN: Freq: Once | INTRAVENOUS | Status: AC

## 2019-10-07 NOTE — Progress Notes (Signed)
CBC and CMP are normal.

## 2019-10-07 NOTE — Telephone Encounter (Signed)
Last Visit: 07/30/2019 Next Visit: 01/07/2020 Labs: 10/07/2019 WNL Eye exam:  11/20/2018 normal.   Current Dose per office note 07/30/2019: Plaquenil 200 mg in the morning and 100 mg at bedtime  Okay to refill per Dr. Corliss Skains

## 2019-10-14 DIAGNOSIS — M069 Rheumatoid arthritis, unspecified: Secondary | ICD-10-CM | POA: Diagnosis not present

## 2019-10-14 DIAGNOSIS — J45909 Unspecified asthma, uncomplicated: Secondary | ICD-10-CM | POA: Diagnosis not present

## 2019-10-14 DIAGNOSIS — I10 Essential (primary) hypertension: Secondary | ICD-10-CM | POA: Diagnosis not present

## 2019-10-14 DIAGNOSIS — R7301 Impaired fasting glucose: Secondary | ICD-10-CM | POA: Diagnosis not present

## 2019-10-14 DIAGNOSIS — E7800 Pure hypercholesterolemia, unspecified: Secondary | ICD-10-CM | POA: Diagnosis not present

## 2019-10-14 DIAGNOSIS — F339 Major depressive disorder, recurrent, unspecified: Secondary | ICD-10-CM | POA: Diagnosis not present

## 2019-10-14 DIAGNOSIS — F419 Anxiety disorder, unspecified: Secondary | ICD-10-CM | POA: Diagnosis not present

## 2019-10-30 ENCOUNTER — Other Ambulatory Visit: Payer: Self-pay | Admitting: Pharmacist

## 2019-10-30 DIAGNOSIS — M0579 Rheumatoid arthritis with rheumatoid factor of multiple sites without organ or systems involvement: Secondary | ICD-10-CM

## 2019-10-30 NOTE — Progress Notes (Signed)
Last Visit: 07/30/2019 Next Visit: 01/07/2020 Labs: Normal 10/07/2019 TB Gold: Negative 07/30/2019  Medication/dose/frequency: Dub Amis IV 1000 mg (based on weight of 104.8 kg) every 28 days  DX: Rheumatoid arthritis  No active orders.  Next infusion scheduled on 11/04/2019.  Orencia orders placed for 2 doses.  Will need lab orders with next Orencia orders.   Verlin Fester, PharmD, [place], CPP Clinical Specialty Pharmacist (Rheumatology and Pulmonology)  10/30/2019 1:29 PM

## 2019-11-04 ENCOUNTER — Other Ambulatory Visit: Payer: Self-pay

## 2019-11-04 ENCOUNTER — Encounter (HOSPITAL_COMMUNITY): Payer: Self-pay

## 2019-11-04 ENCOUNTER — Encounter (HOSPITAL_COMMUNITY)
Admission: RE | Admit: 2019-11-04 | Discharge: 2019-11-04 | Disposition: A | Discharge status: Home / Self Care | Payer: Medicare Other | Source: Ambulatory Visit | Attending: Rheumatology | Admitting: Rheumatology

## 2019-11-04 DIAGNOSIS — M0579 Rheumatoid arthritis with rheumatoid factor of multiple sites without organ or systems involvement: Secondary | ICD-10-CM | POA: Diagnosis not present

## 2019-11-04 MED ORDER — SODIUM CHLORIDE 0.9 % IV SOLN: Freq: Once | INTRAVENOUS | Status: AC

## 2019-11-04 MED ORDER — DIPHENHYDRAMINE HCL 25 MG PO CAPS: 25.0000 mg | ORAL_CAPSULE | Freq: Once | ORAL | Status: DC

## 2019-11-04 MED ORDER — ACETAMINOPHEN 325 MG PO TABS: 650.0000 mg | ORAL_TABLET | Freq: Once | ORAL | Status: DC

## 2019-11-04 MED ORDER — SODIUM CHLORIDE 0.9 % IV SOLN
1000.0000 mg | INTRAVENOUS | Status: DC
  Filled 2019-11-04 (×2): qty 40

## 2019-11-21 DIAGNOSIS — Z79899 Other long term (current) drug therapy: Secondary | ICD-10-CM | POA: Diagnosis not present

## 2019-11-21 DIAGNOSIS — H524 Presbyopia: Secondary | ICD-10-CM | POA: Diagnosis not present

## 2019-11-21 DIAGNOSIS — H43813 Vitreous degeneration, bilateral: Secondary | ICD-10-CM | POA: Diagnosis not present

## 2019-11-21 DIAGNOSIS — H2512 Age-related nuclear cataract, left eye: Secondary | ICD-10-CM | POA: Diagnosis not present

## 2019-12-01 NOTE — Addendum Note (Signed)
Addended by: Verlin Fester C on: 12/01/2019 10:24 AM   Modules accepted: Orders

## 2019-12-01 NOTE — Progress Notes (Signed)
Added standing orders for CBC w/diff and CMP every 2 months x 5.

## 2019-12-02 ENCOUNTER — Encounter (HOSPITAL_COMMUNITY): Payer: Self-pay

## 2019-12-02 ENCOUNTER — Encounter (HOSPITAL_COMMUNITY)
Admission: RE | Admit: 2019-12-02 | Discharge: 2019-12-02 | Disposition: A | Discharge status: Home / Self Care | Payer: Medicare Other | Source: Ambulatory Visit | Attending: Rheumatology | Admitting: Rheumatology

## 2019-12-02 ENCOUNTER — Other Ambulatory Visit: Payer: Self-pay

## 2019-12-02 DIAGNOSIS — M069 Rheumatoid arthritis, unspecified: Secondary | ICD-10-CM | POA: Diagnosis not present

## 2019-12-02 LAB — COMPREHENSIVE METABOLIC PANEL
ALT: 12 U/L (ref 0–44)
AST: 17 U/L (ref 15–41)
Albumin: 3.9 g/dL (ref 3.5–5.0)
Alkaline Phosphatase: 66 U/L (ref 38–126)
Anion gap: 8 (ref 5–15)
BUN: 15 mg/dL (ref 8–23)
CO2: 26 mmol/L (ref 22–32)
Calcium: 9.1 mg/dL (ref 8.9–10.3)
Chloride: 105 mmol/L (ref 98–111)
Creatinine, Ser: 0.73 mg/dL (ref 0.44–1.00)
GFR calc Af Amer: >60 mL/min (ref >60)
GFR calc non Af Amer: >60 mL/min (ref >60)
Glucose, Bld: 82 mg/dL (ref 70–99)
Potassium: 3.5 mmol/L (ref 3.5–5.1)
Sodium: 139 mmol/L (ref 135–145)
Total Bilirubin: 0.6 mg/dL (ref 0.3–1.2)
Total Protein: 6.9 g/dL (ref 6.5–8.1)

## 2019-12-02 LAB — CBC WITH DIFFERENTIAL/PLATELET
Abs Immature Granulocytes: 0.02 10*3/uL (ref 0.00–0.07)
Basophils Absolute: 0 10*3/uL (ref 0.0–0.1)
Basophils Relative: 1 %
Eosinophils Absolute: 0.2 10*3/uL (ref 0.0–0.5)
Eosinophils Relative: 3 %
HCT: 41.4 % (ref 36.0–46.0)
Hemoglobin: 13.4 g/dL (ref 12.0–15.0)
Immature Granulocytes: 0 %
Lymphocytes Relative: 24 %
Lymphs Abs: 1.5 10*3/uL (ref 0.7–4.0)
MCH: 32.1 pg (ref 26.0–34.0)
MCHC: 32.4 g/dL (ref 30.0–36.0)
MCV: 99.3 fL (ref 80.0–100.0)
Monocytes Absolute: 0.6 10*3/uL (ref 0.1–1.0)
Monocytes Relative: 9 %
Neutro Abs: 4 10*3/uL (ref 1.7–7.7)
Neutrophils Relative %: 63 %
Platelets: 323 10*3/uL (ref 150–400)
RBC: 4.17 MIL/uL (ref 3.87–5.11)
RDW: 12.7 % (ref 11.5–15.5)
WBC: 6.4 10*3/uL (ref 4.0–10.5)
nRBC: 0 % (ref 0.0–0.2)

## 2019-12-02 MED ORDER — SODIUM CHLORIDE 0.9 % IV SOLN: Freq: Once | INTRAVENOUS | Status: AC

## 2019-12-02 MED ORDER — SODIUM CHLORIDE 0.9 % IV SOLN
1000.0000 mg | Freq: Once | INTRAVENOUS | Status: AC
  Administered 2019-12-02: 1000 mg via INTRAVENOUS
  Filled 2019-12-02: qty 40

## 2019-12-02 NOTE — Progress Notes (Signed)
CBC/CMP within normal limits.  She can continue current medication regimen of IV Orencia and Plaquenil.

## 2019-12-03 ENCOUNTER — Other Ambulatory Visit: Payer: Self-pay | Admitting: Pharmacist

## 2019-12-03 DIAGNOSIS — M0579 Rheumatoid arthritis with rheumatoid factor of multiple sites without organ or systems involvement: Secondary | ICD-10-CM

## 2019-12-03 NOTE — Progress Notes (Signed)
Patient is due for updated orders for Orencia infusion.  CBC/CMP on 12/02/2019 within normal limits.  Last TB gold 07/30/2019.  Orders placed for Orencia IV 1000 mg every 28 days x 3 doses along with premedication of Tylenol and Benadryl.  Standing orders placed for CBC/CMP every 2 months x 5.  Next TB gold TB in March 2022.  Next infusion scheduled for August 24.  Verlin Fester, PharmD, [place], CPP Clinical Specialty Pharmacist (Rheumatology and Pulmonology)  12/03/2019 8:59 AM

## 2019-12-25 NOTE — Progress Notes (Addendum)
Office Visit Note  Patient: Monica [name]             Date of Birth: 11-18-1940           MRN: [id number]             PCP: Juluis Rainier, MD Referring: Juluis Rainier, MD Visit Date: 01/07/2020 Occupation: @GUAROCC @  Subjective:  Other (upper back pain)   History of Present Illness: Monica [name] is a 79 y.o. female with history of rheumatoid arthritis and osteoarthritis.  She has been taking Orencia infusions and taking Plaquenil on a regular basis.  She denies any joint swelling.  She continues to have pain and discomfort in her right knee joint due to severe osteoarthritis.  She states recently she has been having some discomfort in the upper back especially when she is doing chores.  The pain resolves after stretching.  She denies any radiculopathy.  Activities of Daily Living:  Patient reports morning stiffness for 2-3  hours.   Patient Reports nocturnal pain.  Difficulty dressing/grooming: Denies Difficulty climbing stairs: Reports Difficulty getting out of chair: Reports Difficulty using hands for taps, buttons, cutlery, and/or writing: Reports  Review of Systems  Constitutional: Positive for fatigue.  HENT: Negative for mouth sores, mouth dryness and nose dryness.   Eyes: Positive for dryness.  Respiratory: Negative for shortness of breath and difficulty breathing.   Cardiovascular: Negative for chest pain and palpitations.  Gastrointestinal: Negative for blood in stool, constipation and diarrhea.  Endocrine: Negative for increased urination.  Genitourinary: Negative for difficulty urinating.  Musculoskeletal: Positive for arthralgias, joint pain, joint swelling, myalgias, morning stiffness, muscle tenderness and myalgias.  Skin: Negative for color change, rash and redness.  Allergic/Immunologic: Negative for susceptible to infections.  Neurological: Positive for numbness and parasthesias. Negative for dizziness, headaches, memory loss and weakness.    Hematological: Negative for bruising/bleeding tendency.  Psychiatric/Behavioral: Negative for confusion.    PMFS History:  Patient Active Problem List   Diagnosis Date Noted  . Asthmatic bronchitis 02/16/2019  . Osteopenia 12/20/2016  . History of depression 12/11/2016  . High risk medication use 05/15/2016  . Primary osteoarthritis of both hands 05/15/2016  . Primary osteoarthritis of both feet 05/15/2016  . Primary osteoarthritis of both knees 05/15/2016  . Vitamin D deficiency 05/15/2016  . History of hypertension 05/15/2016  . History of hyperlipidemia 05/15/2016  . Rheumatoid arthritis with rheumatoid factor of multiple sites without organ or systems involvement (HCC) 01/10/2016  . Seasonal allergic rhinitis 10/03/2011  . HYPERLIPIDEMIA 12/31/2008  . HYPERTENSION 12/31/2008  . Mild persistent asthmatic bronchitis with exacerbation 12/31/2008    Past Medical History:  Diagnosis Date  . Arthritis   . 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 Hyperlipidemia   . Hypertension     Family History  Problem Relation Age of Onset  . Lung cancer Father   . Alzheimer's disease Mother    Past Surgical History:  Procedure Laterality Date  . CARPAL TUNNEL RELEASE    . FOOT SURGERY     Social History   Social History Narrative  . Not on file   Immunization History  Administered Date(s) Administered  . Fluad Quad(high Dose 65+) 02/13/2019  . Influenza Nasal 02/29/2012  . Influenza Split 03/02/2011  . Influenza, High Dose Seasonal PF 02/12/2017, 02/12/2018  . Influenza,inj,Quad PF,6+ Mos 02/25/2013, 02/23/2014  . Influenza-Unspecified 03/15/2015  . Moderna SARS-COVID-2 Vaccination 07/16/2019, 08/13/2019  . Pneumococcal Conjugate-13 02/25/2013  Objective: Vital Signs: BP (!) 147/83 (BP Location: Left Arm, Patient Position: Sitting, Cuff Size: Normal)   Pulse 67   Resp 16   Ht 5' 2.75" (1.594 m)   Wt 235 lb (106.6 kg)   BMI 41.96 kg/m     Physical Exam Vitals and nursing note reviewed.  Constitutional:      Appearance: She is well-developed.  HENT:     Head: Normocephalic and atraumatic.  Eyes:     Conjunctiva/sclera: Conjunctivae normal.  Cardiovascular:     Rate and Rhythm: Normal rate and regular rhythm.     Heart sounds: Normal heart sounds.  Pulmonary:     Effort: Pulmonary effort is normal.     Breath sounds: Normal breath sounds.  Abdominal:     General: Bowel sounds are normal.     Palpations: Abdomen is soft.  Musculoskeletal:     Cervical back: Normal range of motion.  Lymphadenopathy:     Cervical: No cervical adenopathy.  Skin:    General: Skin is warm and dry.     Capillary Refill: Capillary refill takes less than 2 seconds.  Neurological:     Mental Status: She is alert and oriented to person, place, and time.  Psychiatric:        Behavior: Behavior normal.      Musculoskeletal Exam: C-spine was in good range of motion.  She had no point tenderness in the thoracic or lumbar region.  Shoulder joints, elbow joints, wrist joints with good range of motion.  She has bilateral CMC thickening.  PIP and DIP thickening with no synovitis was noted.  Hip joints with good range of motion.  She had no swelling over her knee joints.  She has bilateral valgus deformity in her knee joints.  She had no MTP tenderness.  CDAI Exam: CDAI Score: 4.8  Patient Global: 4 mm; Provider Global: 4 mm Swollen: 0 ; Tender: 4  Joint Exam 01/07/2020      Right  Left  Glenohumeral   Tender   Tender  Knee   Tender   Tender     Investigation: No additional findings.  Imaging: No results found.  Recent Labs: Lab Results  Component Value Date   WBC 6.4 12/02/2019   HGB 13.4 12/02/2019   PLT 323 12/02/2019   NA 139 12/02/2019   K 3.5 12/02/2019   CL 105 12/02/2019   CO2 26 12/02/2019   GLUCOSE 82 12/02/2019   BUN 15 12/02/2019   CREATININE 0.73 12/02/2019   BILITOT 0.6 12/02/2019   ALKPHOS 66 12/02/2019    AST 17 12/02/2019   ALT 12 12/02/2019   PROT 6.9 12/02/2019   ALBUMIN 3.9 12/02/2019   CALCIUM 9.1 12/02/2019   GFRAA >60 12/02/2019   QFTBGOLD Negative 07/24/2016   QFTBGOLDPLUS NEGATIVE 07/30/2019    Speciality Comments: PLQ eye exam: 11/21/2019 @ [facility]. Follow up in 1 year. Orencia 1000 mg / started on 09/2016  Prior therapy includes: Simponi Aria (inadequate response)  Procedures:  No procedures performed Allergies: Arava [leflunomide] and Piroxicam   Assessment / Plan:     Visit Diagnoses: Rheumatoid arthritis with rheumatoid factor of multiple sites without organ or systems involvement (HCC)-patient denies having any flare of rheumatoid arthritis.  Although she continues to have some pain and discomfort off and on.  High risk medication use - Orencia IV 1000 mg every 28 days and Plaquenil 200 mg in the morning and 100 mg at bedtime. PLQ eye exam: 11/21/2019.  Her labs  are stable.  We will continue to get labs with her infusions.  Primary osteoarthritis of both hands - x-rays were consistent with osteoarthritis and rheumatoid arthritis overlap.  Erosive changes were noted in the left wrist in October 2020.  She had no synovitis on examination today.  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  She is a candidate for total knee replacement but she is not ready yet.  She has bilateral valgus deformity.  She states that she has some instability with walking.  She uses a cane at times.  More frequent use of cane was discussed.  Primary osteoarthritis of both feet - X-rays were consistent with rheumatoid arthritis and osteoarthritis overlap.  She has dorsal spurs.  Thoracic pain-she states she has some thoracic pain which she describes in the paravertebral muscular region.  It only happens when she has been standing and doing chores for a long time.  The symptoms appear to be muscular.  She had no point  tenderness or radiculopathy.  Have given her a handout on back exercises.  Osteopenia of multiple sites - Last DEXA on 11/06/2018 showed T-score -2.0 at lumbar spine and right hip had -11% change in BMD.  We had detailed discussion regarding her bone density today.  The findings were reviewed.  She has significant drop in her BMD.  I discussed use of bisphosphonates again but she declined.  She will continue to use calcium, vitamin D and resistive exercises.  History of hypertension-blood pressure was elevated today.  Have advised her to monitor blood pressure closely and follow-up with her PCP.  History of hyperlipidemia  History of vitamin D deficiency-she states that she is taking vitamin D supplement.  History of depression  History of asthma  She is fully vaccinated against COVID-19.  Use of mask, social distancing and hand hygiene was discussed.  She also needs the third vaccine for COVID-19 as she is immunosuppressed.  Instructions were given and were placed in the AVS.  Use of monoclonal antibodies were also discussed in case she develops COVID-19 infection.  Orders: No orders of the defined types were placed in this encounter.  No orders of the defined types were placed in this encounter.   Follow-Up Instructions: Return in about 5 months (around 06/08/2020) for Rheumatoid arthritis.   Pollyann Savoy, MD  Note - This record has been created using Animal nutritionist.  Chart creation errors have been sought, but may not always  have been located. Such creation errors do not reflect on  the standard of medical care.

## 2019-12-26 DIAGNOSIS — F339 Major depressive disorder, recurrent, unspecified: Secondary | ICD-10-CM | POA: Diagnosis not present

## 2019-12-26 DIAGNOSIS — J45909 Unspecified asthma, uncomplicated: Secondary | ICD-10-CM | POA: Diagnosis not present

## 2019-12-26 DIAGNOSIS — E7800 Pure hypercholesterolemia, unspecified: Secondary | ICD-10-CM | POA: Diagnosis not present

## 2019-12-26 DIAGNOSIS — M069 Rheumatoid arthritis, unspecified: Secondary | ICD-10-CM | POA: Diagnosis not present

## 2019-12-26 DIAGNOSIS — I10 Essential (primary) hypertension: Secondary | ICD-10-CM | POA: Diagnosis not present

## 2019-12-30 ENCOUNTER — Encounter (HOSPITAL_COMMUNITY): Payer: Self-pay

## 2019-12-30 ENCOUNTER — Other Ambulatory Visit: Payer: Self-pay

## 2019-12-30 ENCOUNTER — Encounter (HOSPITAL_COMMUNITY)
Admission: RE | Admit: 2019-12-30 | Discharge: 2019-12-30 | Disposition: A | Discharge status: Home / Self Care | Payer: Medicare Other | Source: Ambulatory Visit | Attending: Rheumatology | Admitting: Rheumatology

## 2019-12-30 DIAGNOSIS — M0579 Rheumatoid arthritis with rheumatoid factor of multiple sites without organ or systems involvement: Secondary | ICD-10-CM | POA: Insufficient documentation

## 2019-12-30 MED ORDER — SODIUM CHLORIDE 0.9 % IV SOLN: Freq: Once | INTRAVENOUS | Status: AC

## 2019-12-30 MED ORDER — DIPHENHYDRAMINE HCL 25 MG PO CAPS: 25.0000 mg | ORAL_CAPSULE | Freq: Once | ORAL | Status: DC

## 2019-12-30 MED ORDER — ACETAMINOPHEN 325 MG PO TABS: 650.0000 mg | ORAL_TABLET | Freq: Once | ORAL | Status: DC

## 2019-12-30 MED ORDER — SODIUM CHLORIDE 0.9 % IV SOLN
1000.0000 mg | INTRAVENOUS | Status: DC
  Administered 2019-12-30: 1000 mg via INTRAVENOUS
  Filled 2019-12-30: qty 40

## 2019-12-31 ENCOUNTER — Other Ambulatory Visit: Payer: Self-pay | Admitting: Rheumatology

## 2019-12-31 DIAGNOSIS — M0579 Rheumatoid arthritis with rheumatoid factor of multiple sites without organ or systems involvement: Secondary | ICD-10-CM

## 2019-12-31 NOTE — Telephone Encounter (Signed)
Last Visit: 07/30/2019 Next Visit: 01/07/2020 Labs: 10/07/2019 WNL Eye exam:  11/21/2019   Current Dose per office note 07/30/2019: Plaquenil 200 mg in the morning and 100 mg at bedtime  Okay to refill per Dr. Corliss Skains

## 2020-01-07 ENCOUNTER — Ambulatory Visit (INDEPENDENT_AMBULATORY_CARE_PROVIDER_SITE_OTHER): Payer: Medicare Other | Admitting: Rheumatology

## 2020-01-07 ENCOUNTER — Other Ambulatory Visit: Payer: Self-pay

## 2020-01-07 ENCOUNTER — Encounter: Payer: Self-pay | Admitting: Rheumatology

## 2020-01-07 VITALS — BP 147/83 | HR 67 | Resp 16 | Ht 62.75 in | Wt 235.0 lb

## 2020-01-07 DIAGNOSIS — M19072 Primary osteoarthritis, left ankle and foot: Secondary | ICD-10-CM

## 2020-01-07 DIAGNOSIS — M546 Pain in thoracic spine: Secondary | ICD-10-CM | POA: Diagnosis not present

## 2020-01-07 DIAGNOSIS — M19071 Primary osteoarthritis, right ankle and foot: Secondary | ICD-10-CM

## 2020-01-07 DIAGNOSIS — Z8659 Personal history of other mental and behavioral disorders: Secondary | ICD-10-CM

## 2020-01-07 DIAGNOSIS — Z8679 Personal history of other diseases of the circulatory system: Secondary | ICD-10-CM | POA: Diagnosis not present

## 2020-01-07 DIAGNOSIS — M19042 Primary osteoarthritis, left hand: Secondary | ICD-10-CM | POA: Diagnosis not present

## 2020-01-07 DIAGNOSIS — M8589 Other specified disorders of bone density and structure, multiple sites: Secondary | ICD-10-CM

## 2020-01-07 DIAGNOSIS — Z8709 Personal history of other diseases of the respiratory system: Secondary | ICD-10-CM | POA: Diagnosis not present

## 2020-01-07 DIAGNOSIS — Z8639 Personal history of other endocrine, nutritional and metabolic disease: Secondary | ICD-10-CM | POA: Diagnosis not present

## 2020-01-07 DIAGNOSIS — M19041 Primary osteoarthritis, right hand: Secondary | ICD-10-CM

## 2020-01-07 DIAGNOSIS — Z79899 Other long term (current) drug therapy: Secondary | ICD-10-CM | POA: Diagnosis not present

## 2020-01-07 DIAGNOSIS — M170 Bilateral primary osteoarthritis of knee: Secondary | ICD-10-CM | POA: Diagnosis not present

## 2020-01-07 DIAGNOSIS — M0579 Rheumatoid arthritis with rheumatoid factor of multiple sites without organ or systems involvement: Secondary | ICD-10-CM | POA: Diagnosis not present

## 2020-01-07 NOTE — Patient Instructions (Addendum)
COVID-19 vaccine recommendations:   COVID-19 vaccine is recommended for everyone (unless you are allergic to a vaccine component), even if you are on a medication that suppresses your immune system.   If you are on Methotrexate, Cellcept (mycophenolate), Rinvoq, Xeljanz, and Olumiant- hold the medication for 1 week after each vaccine. Hold Methotrexate for 2 weeks after the single dose COVID-19 vaccine.   If you are on Orencia subcutaneous injection - hold medication one week prior to and one week after the first COVID-19 vaccine dose (only).   If you are on Orencia IV infusions- time vaccination administration so that the first COVID-19 vaccination will occur four weeks after the infusion and postpone the subsequent infusion by one week.   If you are on Cyclophosphamide or Rituxan infusions please contact your doctor prior to receiving the COVID-19 vaccine.   Do not take Tylenol or any anti-inflammatory medications (NSAIDs) 24 hours prior to the COVID-19 vaccination.   There is no direct evidence about the efficacy of the COVID-19 vaccine in individuals who are on medications that suppress the immune system.   Even if you are fully vaccinated, and you are on any medications that suppress your immune system, please continue to wear a mask, maintain at least six feet social distance and practice hand hygiene.   If you develop a COVID-19 infection, please contact your PCP or our office to determine if you need antibody infusion.  The booster vaccine is now available for immunocompromised patients. It is advised that if you had Pfizer vaccine you should get Pfizer booster.  If you had a Moderna vaccine then you should get a Moderna booster. Johnson and Johnson does not have a booster vaccine at this time.  Please see the following web sites for updated information.    https://www.rheumatology.org/Portals/0/Files/COVID-19-Vaccination-Patient-Resources.pdf  https://www.rheumatology.org/About-Us/Newsroom/Press-Releases/ID/1159  Back Exercises The following exercises strengthen the muscles that help to support the trunk and back. They also help to keep the lower back flexible. Doing these exercises can help to prevent back pain or lessen existing pain.  If you have back pain or discomfort, try doing these exercises 2-3 times each day or as told by your health care provider.  As your pain improves, do them once each day, but increase the number of times that you repeat the steps for each exercise (do more repetitions).  To prevent the recurrence of back pain, continue to do these exercises once each day or as told by your health care provider. Do exercises exactly as told by your health care provider and adjust them as directed. It is normal to feel mild stretching, pulling, tightness, or discomfort as you do these exercises, but you should stop right away if you feel sudden pain or your pain gets worse. Exercises Single knee to chest Repeat these steps 3-5 times for each leg: 1. Lie on your back on a firm bed or the floor with your legs extended. 2. Bring one knee to your chest. Your other leg should stay extended and in contact with the floor. 3. Hold your knee in place by grabbing your knee or thigh with both hands and hold. 4. Pull on your knee until you feel a gentle stretch in your lower back or buttocks. 5. Hold the stretch for 10-30 seconds. 6. Slowly release and straighten your leg. Pelvic tilt Repeat these steps 5-10 times: 1. Lie on your back on a firm bed or the floor with your legs extended. 2. Bend your knees so they are pointing toward the   ceiling and your feet are flat on the floor. 3. Tighten your lower abdominal muscles to press your lower back against the floor. This motion will tilt your pelvis so your tailbone points up toward the  ceiling instead of pointing to your feet or the floor. 4. With gentle tension and even breathing, hold this position for 5-10 seconds. Cat-cow Repeat these steps until your lower back becomes more flexible: 1. Get into a hands-and-knees position on a firm surface. Keep your hands under your shoulders, and keep your knees under your hips. You may place padding under your knees for comfort. 2. Let your head hang down toward your chest. Contract your abdominal muscles and point your tailbone toward the floor so your lower back becomes rounded like the back of a cat. 3. Hold this position for 5 seconds. 4. Slowly lift your head, let your abdominal muscles relax and point your tailbone up toward the ceiling so your back forms a sagging arch like the back of a cow. 5. Hold this position for 5 seconds.  Press-ups Repeat these steps 5-10 times: 1. Lie on your abdomen (face-down) on the floor. 2. Place your palms near your head, about shoulder-width apart. 3. Keeping your back as relaxed as possible and keeping your hips on the floor, slowly straighten your arms to raise the top half of your body and lift your shoulders. Do not use your back muscles to raise your upper torso. You may adjust the placement of your hands to make yourself more comfortable. 4. Hold this position for 5 seconds while you keep your back relaxed. 5. Slowly return to lying flat on the floor.  Bridges Repeat these steps 10 times: 1. Lie on your back on a firm surface. 2. Bend your knees so they are pointing toward the ceiling and your feet are flat on the floor. Your arms should be flat at your sides, next to your body. 3. Tighten your buttocks muscles and lift your buttocks off the floor until your waist is at almost the same height as your knees. You should feel the muscles working in your buttocks and the back of your thighs. If you do not feel these muscles, slide your feet 1-2 inches farther away from your buttocks. 4. Hold  this position for 3-5 seconds. 5. Slowly lower your hips to the starting position, and allow your buttocks muscles to relax completely. If this exercise is too easy, try doing it with your arms crossed over your chest. Abdominal crunches Repeat these steps 5-10 times: 1. Lie on your back on a firm bed or the floor with your legs extended. 2. Bend your knees so they are pointing toward the ceiling and your feet are flat on the floor. 3. Cross your arms over your chest. 4. Tip your chin slightly toward your chest without bending your neck. 5. Tighten your abdominal muscles and slowly raise your trunk (torso) high enough to lift your shoulder blades a tiny bit off the floor. Avoid raising your torso higher than that because it can put too much stress on your low back and does not help to strengthen your abdominal muscles. 6. Slowly return to your starting position. Back lifts Repeat these steps 5-10 times: 1. Lie on your abdomen (face-down) with your arms at your sides, and rest your forehead on the floor. 2. Tighten the muscles in your legs and your buttocks. 3. Slowly lift your chest off the floor while you keep your hips pressed to the floor. Keep   the back of your head in line with the curve in your back. Your eyes should be looking at the floor. 4. Hold this position for 3-5 seconds. 5. Slowly return to your starting position. Contact a health care provider if:  Your back pain or discomfort gets much worse when you do an exercise.  Your worsening back pain or discomfort does not lessen within 2 hours after you exercise. If you have any of these problems, stop doing these exercises right away. Do not do them again unless your health care provider says that you can. Get help right away if:  You develop sudden, severe back pain. If this happens, stop doing the exercises right away. Do not do them again unless your health care provider says that you can. This information is not intended to  replace advice given to you by your health care provider. Make sure you discuss any questions you have with your health care provider. Document Revised: 08/29/2018 Document Reviewed: 01/24/2018 Elsevier Patient Education  2020 [employer]  

## 2020-01-27 ENCOUNTER — Other Ambulatory Visit: Payer: Self-pay

## 2020-01-27 ENCOUNTER — Encounter (HOSPITAL_COMMUNITY)
Admission: RE | Admit: 2020-01-27 | Discharge: 2020-01-27 | Disposition: A | Discharge status: Home / Self Care | Payer: Medicare Other | Source: Ambulatory Visit | Attending: Rheumatology | Admitting: Rheumatology

## 2020-01-27 ENCOUNTER — Encounter (HOSPITAL_COMMUNITY): Payer: Self-pay

## 2020-01-27 DIAGNOSIS — M0579 Rheumatoid arthritis with rheumatoid factor of multiple sites without organ or systems involvement: Secondary | ICD-10-CM | POA: Insufficient documentation

## 2020-01-27 LAB — CBC WITH DIFFERENTIAL/PLATELET
Abs Immature Granulocytes: 0.01 10*3/uL (ref 0.00–0.07)
Basophils Absolute: 0.1 10*3/uL (ref 0.0–0.1)
Basophils Relative: 1 %
Eosinophils Absolute: 0.2 10*3/uL (ref 0.0–0.5)
Eosinophils Relative: 3 %
HCT: 41 % (ref 36.0–46.0)
Hemoglobin: 13.5 g/dL (ref 12.0–15.0)
Immature Granulocytes: 0 %
Lymphocytes Relative: 25 %
Lymphs Abs: 1.7 10*3/uL (ref 0.7–4.0)
MCH: 32.7 pg (ref 26.0–34.0)
MCHC: 32.9 g/dL (ref 30.0–36.0)
MCV: 99.3 fL (ref 80.0–100.0)
Monocytes Absolute: 0.7 10*3/uL (ref 0.1–1.0)
Monocytes Relative: 10 %
Neutro Abs: 4.1 10*3/uL (ref 1.7–7.7)
Neutrophils Relative %: 61 %
Platelets: 309 10*3/uL (ref 150–400)
RBC: 4.13 MIL/uL (ref 3.87–5.11)
RDW: 12.5 % (ref 11.5–15.5)
WBC: 6.7 10*3/uL (ref 4.0–10.5)
nRBC: 0 % (ref 0.0–0.2)

## 2020-01-27 LAB — COMPREHENSIVE METABOLIC PANEL
ALT: 13 U/L (ref 0–44)
AST: 15 U/L (ref 15–41)
Albumin: 3.8 g/dL (ref 3.5–5.0)
Alkaline Phosphatase: 61 U/L (ref 38–126)
Anion gap: 8 (ref 5–15)
BUN: 14 mg/dL (ref 8–23)
CO2: 29 mmol/L (ref 22–32)
Calcium: 9.2 mg/dL (ref 8.9–10.3)
Chloride: 103 mmol/L (ref 98–111)
Creatinine, Ser: 0.76 mg/dL (ref 0.44–1.00)
GFR calc Af Amer: >60 mL/min (ref >60)
GFR calc non Af Amer: >60 mL/min (ref >60)
Glucose, Bld: 84 mg/dL (ref 70–99)
Potassium: 3.7 mmol/L (ref 3.5–5.1)
Sodium: 140 mmol/L (ref 135–145)
Total Bilirubin: 0.8 mg/dL (ref 0.3–1.2)
Total Protein: 6.5 g/dL (ref 6.5–8.1)

## 2020-01-27 MED ORDER — SODIUM CHLORIDE 0.9 % IV SOLN: Freq: Once | INTRAVENOUS | Status: AC

## 2020-01-27 MED ORDER — SODIUM CHLORIDE 0.9 % IV SOLN
1000.0000 mg | Freq: Once | INTRAVENOUS | Status: AC
  Administered 2020-01-27: 1000 mg via INTRAVENOUS
  Filled 2020-01-27: qty 40

## 2020-01-27 NOTE — Progress Notes (Signed)
CBC/CMP within normal limits.  Continue Orencia IV and Plaquenil.

## 2020-02-18 ENCOUNTER — Encounter: Payer: Self-pay | Admitting: Internal Medicine

## 2020-02-18 ENCOUNTER — Other Ambulatory Visit: Payer: Self-pay

## 2020-02-18 ENCOUNTER — Ambulatory Visit (INDEPENDENT_AMBULATORY_CARE_PROVIDER_SITE_OTHER): Payer: Medicare Other

## 2020-02-18 ENCOUNTER — Ambulatory Visit (INDEPENDENT_AMBULATORY_CARE_PROVIDER_SITE_OTHER): Payer: Medicare Other | Admitting: Internal Medicine

## 2020-02-18 VITALS — BP 136/76 | HR 82 | Temp 94.0°F | Ht 62.5 in | Wt 232.2 lb

## 2020-02-18 DIAGNOSIS — J45909 Unspecified asthma, uncomplicated: Secondary | ICD-10-CM | POA: Diagnosis not present

## 2020-02-18 DIAGNOSIS — J4530 Mild persistent asthma, uncomplicated: Secondary | ICD-10-CM

## 2020-02-18 DIAGNOSIS — M0579 Rheumatoid arthritis with rheumatoid factor of multiple sites without organ or systems involvement: Secondary | ICD-10-CM

## 2020-02-18 DIAGNOSIS — Z23 Encounter for immunization: Secondary | ICD-10-CM | POA: Diagnosis not present

## 2020-02-18 DIAGNOSIS — J301 Allergic rhinitis due to pollen: Secondary | ICD-10-CM

## 2020-02-18 NOTE — Progress Notes (Signed)
HPI female never smoker followed for asthma, allergic rhinitis, complicated by rheumatoid arthritis PFT 02/02/09-mild obstruction small airways, minimal response bronchodilator, DLCO mildly reduced. FVC 3.36/116%, FEV1 2.48/119%, ratio 0.74, FEF 25-75% 1.80/75%, TLC 108%, DLCO 70% Office Spirometry 09/08/16-WNL-FVC 2.63/98%, FEV1 1.91/95%, ratio 0.73, FEF 25-75% 1.32/82% FENO 02/12/17- 32H -------------------------------------------------------------------------------------  02/13/2019- 79 year old female never smoker followed for asthma, chronic cough, allergic rhinitis, complicated by rheumatoid arthritis Trelegy, Ventolin hfa -----pt states breathing is at baseline; reports seasonal allergies, taking Trelegy once daily and albuterol as needed Uses rescue inhaler < 1x/ week. Trelegy works well. Minor seasonal rhinitis this Fall.  RA is better controlled on current infusion.   02/18/20-  79 year old female never smoker followed for Asthmatic Bronchitis, chronic cough, Allergic Rhinitis, complicated by Rheumatoid Arthritis, Osteoarthritis, Depression, HTN,  Trelegy, Ventolin hfa       Orencia and Plaquenil for RA Covid vax- 2 Moderna Flu vax- today senior Doing well with no acute events. Using trescue inhaler </= 1x/ month. Trelegy daily working well.    ROS-see HPI    + = positive Constitutional:   No-   weight loss, night sweats, fevers, chills, fatigue, lassitude. HEENT:   No-  headaches, difficulty swallowing, tooth/dental problems, sore throat,       No-  sneezing, itching, ear ache, nasal congestion, post nasal drip,  CV:  No-   chest pain, orthopnea, PND, +swelling in lower extremities, anasarca, dizziness, palpitations Resp:   + shortness of breath with exertion or at rest.              No-   productive cough,   non-productive cough,  No- coughing up of blood.              No-   change in color of mucus.  +rare wheezing.   Skin: No-   rash or lesions. GI:  No-   heartburn,  indigestion, abdominal pain, nausea, vomiting,  GU:  MS: + joint pain or swelling.   Neuro-     nothing unusual Psych:  No- change in mood or affect. No depression or anxiety.  No memory loss.  OBJ        General- Alert, Oriented, Affect-appropriate, Distress- none acute. + Morbidly obese Skin- rash-none, lesions- none, excoriation- none Lymphadenopathy- none Head- atraumatic            Eyes- Gross vision intact, PERRLA, conjunctivae clear secretions            Ears- Hearing, canals-normal            Nose- Clear, no-Septal dev, mucus, polyps, erosion, perforation             Throat- Mallampati III , mucosa clear , drainage- none, tonsils- atrophic. +Easy gag. Neck- flexible , trachea midline, no stridor , thyroid nl, carotid no bruit Chest - symmetrical excursion , unlabored           Heart/CV- RRR , no murmur , no gallop  , no rub, nl s1 s2                           - JVD- none , edema- none, stasis changes- none, varices- none           Lung- clear, wheeze- none, cough- none , dullness-none, rub- none           Chest wall-  Abd-  Br/ Gen/ Rectal- Not done, not indicated Extrem- cyanosis- none, clubbing, none, atrophy- none, strength-  nl Neuro- grossly intact to observation

## 2020-02-18 NOTE — Patient Instructions (Signed)
Order- Flu vax  Senior  Order- CXR   Dx Asthma, Rheumatoid Arthritis  Ok to continue current meds.  Please call us if we can help

## 2020-02-23 NOTE — Progress Notes (Signed)
Called and spoke with patient about xray results per Dr Young. All questions answered and patient expressed full understanding. Nothing further needed at this time.

## 2020-02-24 ENCOUNTER — Encounter (HOSPITAL_COMMUNITY): Payer: Self-pay

## 2020-02-24 ENCOUNTER — Other Ambulatory Visit: Payer: Self-pay

## 2020-02-24 ENCOUNTER — Encounter (HOSPITAL_COMMUNITY)
Admission: RE | Admit: 2020-02-24 | Discharge: 2020-02-24 | Disposition: A | Discharge status: Home / Self Care | Payer: Medicare Other | Source: Ambulatory Visit | Attending: Rheumatology | Admitting: Rheumatology

## 2020-02-24 DIAGNOSIS — M059 Rheumatoid arthritis with rheumatoid factor, unspecified: Secondary | ICD-10-CM | POA: Diagnosis not present

## 2020-02-24 MED ORDER — SODIUM CHLORIDE 0.9 % IV SOLN: Freq: Once | INTRAVENOUS | Status: AC

## 2020-02-24 MED ORDER — SODIUM CHLORIDE 0.9 % IV SOLN
1000.0000 mg | Freq: Once | INTRAVENOUS | Status: AC
  Administered 2020-02-24: 1000 mg via INTRAVENOUS
  Filled 2020-02-24: qty 40

## 2020-02-24 NOTE — Assessment & Plan Note (Signed)
Occasional use of otc meds this Fall, but well controlled

## 2020-02-24 NOTE — Assessment & Plan Note (Signed)
Mild persistent uncomplicated. Trelegy has really helpd Plan- Flu vax, update CXR

## 2020-02-29 DIAGNOSIS — I10 Essential (primary) hypertension: Secondary | ICD-10-CM | POA: Diagnosis not present

## 2020-02-29 DIAGNOSIS — F339 Major depressive disorder, recurrent, unspecified: Secondary | ICD-10-CM | POA: Diagnosis not present

## 2020-02-29 DIAGNOSIS — J45909 Unspecified asthma, uncomplicated: Secondary | ICD-10-CM | POA: Diagnosis not present

## 2020-02-29 DIAGNOSIS — M069 Rheumatoid arthritis, unspecified: Secondary | ICD-10-CM | POA: Diagnosis not present

## 2020-02-29 DIAGNOSIS — E7800 Pure hypercholesterolemia, unspecified: Secondary | ICD-10-CM | POA: Diagnosis not present

## 2020-03-08 ENCOUNTER — Other Ambulatory Visit: Payer: Self-pay | Admitting: Internal Medicine

## 2020-03-23 ENCOUNTER — Other Ambulatory Visit: Payer: Self-pay

## 2020-03-23 ENCOUNTER — Encounter (HOSPITAL_COMMUNITY)
Admission: RE | Admit: 2020-03-23 | Discharge: 2020-03-23 | Disposition: A | Discharge status: Home / Self Care | Payer: Medicare Other | Source: Ambulatory Visit | Attending: Rheumatology | Admitting: Rheumatology

## 2020-03-23 ENCOUNTER — Encounter (HOSPITAL_COMMUNITY): Payer: Self-pay

## 2020-03-23 DIAGNOSIS — M0589 Other rheumatoid arthritis with rheumatoid factor of multiple sites: Secondary | ICD-10-CM | POA: Diagnosis not present

## 2020-03-23 LAB — COMPREHENSIVE METABOLIC PANEL
ALT: 12 U/L (ref 0–44)
AST: 18 U/L (ref 15–41)
Albumin: 4 g/dL (ref 3.5–5.0)
Alkaline Phosphatase: 65 U/L (ref 38–126)
Anion gap: 8 (ref 5–15)
BUN: 13 mg/dL (ref 8–23)
CO2: 30 mmol/L (ref 22–32)
Calcium: 9.3 mg/dL (ref 8.9–10.3)
Chloride: 101 mmol/L (ref 98–111)
Creatinine, Ser: 0.85 mg/dL (ref 0.44–1.00)
GFR, Estimated: >60 mL/min (ref >60)
Glucose, Bld: 80 mg/dL (ref 70–99)
Potassium: 3.7 mmol/L (ref 3.5–5.1)
Sodium: 139 mmol/L (ref 135–145)
Total Bilirubin: 0.8 mg/dL (ref 0.3–1.2)
Total Protein: 6.8 g/dL (ref 6.5–8.1)

## 2020-03-23 LAB — CBC
HCT: 42.4 % (ref 36.0–46.0)
Hemoglobin: 13.7 g/dL (ref 12.0–15.0)
MCH: 32.2 pg (ref 26.0–34.0)
MCHC: 32.3 g/dL (ref 30.0–36.0)
MCV: 99.5 fL (ref 80.0–100.0)
Platelets: 329 10*3/uL (ref 150–400)
RBC: 4.26 MIL/uL (ref 3.87–5.11)
RDW: 12.7 % (ref 11.5–15.5)
WBC: 6.7 10*3/uL (ref 4.0–10.5)
nRBC: 0 % (ref 0.0–0.2)

## 2020-03-23 MED ORDER — SODIUM CHLORIDE 0.9 % IV SOLN: Freq: Once | INTRAVENOUS | Status: AC

## 2020-03-23 MED ORDER — SODIUM CHLORIDE 0.9 % IV SOLN
1000.0000 mg | Freq: Once | INTRAVENOUS | Status: AC
  Administered 2020-03-23: 1000 mg via INTRAVENOUS
  Filled 2020-03-23: qty 40

## 2020-03-23 NOTE — Progress Notes (Signed)
CBC and CMP are normal.

## 2020-04-03 ENCOUNTER — Other Ambulatory Visit: Payer: Self-pay | Admitting: Rheumatology

## 2020-04-03 DIAGNOSIS — M0579 Rheumatoid arthritis with rheumatoid factor of multiple sites without organ or systems involvement: Secondary | ICD-10-CM

## 2020-04-05 NOTE — Telephone Encounter (Signed)
Last Visit: 01/07/2020 Next Visit: 06/16/2020 Labs: 03/23/2020 CBC and CMP are normal. Eye exam: 11/21/2019  Current Dose per office note 01/07/2020: Plaquenil 200 mg in the morning and 100 mg at bedtime. NZ:[id number] arthritis with rheumatoid factor of multiple sites without organ or systems involvement   Okay to refill per Dr. Corliss Skains

## 2020-04-15 ENCOUNTER — Telehealth: Payer: Self-pay | Admitting: Pharmacy Technician

## 2020-04-15 DIAGNOSIS — R7303 Prediabetes: Secondary | ICD-10-CM | POA: Diagnosis not present

## 2020-04-15 DIAGNOSIS — M069 Rheumatoid arthritis, unspecified: Secondary | ICD-10-CM | POA: Diagnosis not present

## 2020-04-15 DIAGNOSIS — F419 Anxiety disorder, unspecified: Secondary | ICD-10-CM | POA: Diagnosis not present

## 2020-04-15 DIAGNOSIS — I10 Essential (primary) hypertension: Secondary | ICD-10-CM | POA: Diagnosis not present

## 2020-04-15 DIAGNOSIS — E7800 Pure hypercholesterolemia, unspecified: Secondary | ICD-10-CM | POA: Diagnosis not present

## 2020-04-15 DIAGNOSIS — F339 Major depressive disorder, recurrent, unspecified: Secondary | ICD-10-CM | POA: Diagnosis not present

## 2020-04-15 NOTE — Telephone Encounter (Signed)
Medicare covers 80% of the infusion and no authorization is required, and the patient’s BCBS[employer] supplement would cover the 20% of the cost that was not paid for by Medicare as long as Medicare covered the medication. The Supplement also covers the patient’s Medicare deductible °

## 2020-04-20 ENCOUNTER — Encounter (HOSPITAL_COMMUNITY): Payer: Medicare Other

## 2020-04-20 ENCOUNTER — Encounter (HOSPITAL_COMMUNITY): Payer: Self-pay

## 2020-04-20 ENCOUNTER — Other Ambulatory Visit: Payer: Self-pay

## 2020-04-20 ENCOUNTER — Encounter (HOSPITAL_COMMUNITY)
Admission: RE | Admit: 2020-04-20 | Discharge: 2020-04-20 | Disposition: A | Discharge status: Home / Self Care | Payer: Medicare Other | Source: Ambulatory Visit | Attending: Rheumatology | Admitting: Rheumatology

## 2020-04-20 DIAGNOSIS — M0579 Rheumatoid arthritis with rheumatoid factor of multiple sites without organ or systems involvement: Secondary | ICD-10-CM | POA: Diagnosis not present

## 2020-04-20 MED ORDER — SODIUM CHLORIDE 0.9 % IV SOLN: Freq: Once | INTRAVENOUS | Status: AC

## 2020-04-20 MED ORDER — SODIUM CHLORIDE 0.9 % IV SOLN
1000.0000 mg | Freq: Once | INTRAVENOUS | Status: AC
  Administered 2020-04-20: 10:00:00 1000 mg via INTRAVENOUS
  Filled 2020-04-20: qty 40

## 2020-04-22 DIAGNOSIS — J45909 Unspecified asthma, uncomplicated: Secondary | ICD-10-CM | POA: Diagnosis not present

## 2020-04-22 DIAGNOSIS — M069 Rheumatoid arthritis, unspecified: Secondary | ICD-10-CM | POA: Diagnosis not present

## 2020-04-22 DIAGNOSIS — F339 Major depressive disorder, recurrent, unspecified: Secondary | ICD-10-CM | POA: Diagnosis not present

## 2020-04-22 DIAGNOSIS — E7800 Pure hypercholesterolemia, unspecified: Secondary | ICD-10-CM | POA: Diagnosis not present

## 2020-04-22 DIAGNOSIS — I10 Essential (primary) hypertension: Secondary | ICD-10-CM | POA: Diagnosis not present

## 2020-05-11 ENCOUNTER — Telehealth: Payer: Self-pay

## 2020-05-11 NOTE — Telephone Encounter (Signed)
I recommend postponing surgery by 1 week after the surgery.  If there is no infection after 1 week then she can proceed with Orencia infusion.

## 2020-05-11 NOTE — Telephone Encounter (Signed)
Patient called stating her dentist prescribed Amoxicillin 500 mg for a dental surgery on Thursday, 05/13/20.  Patient states she is scheduled for Orencia infusion on 05/18/20 and is not sure if she needs to reschedule.  Patient requested a return call.

## 2020-05-11 NOTE — Telephone Encounter (Signed)
Patient advised Dr. Corliss Skains recommends postponing infusion by 1 week after the surgery.  If there is no infection after 1 week then she can proceed with Orencia infusion. Patient expressed understanding.

## 2020-05-13 HISTORY — PX: MOUTH SURGERY: SHX715

## 2020-05-18 ENCOUNTER — Encounter (HOSPITAL_COMMUNITY): Admission: RE | Admit: 2020-05-18 | Payer: Medicare Other | Source: Ambulatory Visit

## 2020-05-25 ENCOUNTER — Encounter (HOSPITAL_COMMUNITY)
Admission: RE | Admit: 2020-05-25 | Discharge: 2020-05-25 | Disposition: A | Discharge status: Home / Self Care | Payer: Medicare Other | Source: Ambulatory Visit | Attending: Rheumatology | Admitting: Rheumatology

## 2020-05-25 ENCOUNTER — Encounter (HOSPITAL_COMMUNITY): Payer: Self-pay

## 2020-05-25 ENCOUNTER — Other Ambulatory Visit: Payer: Self-pay

## 2020-05-25 DIAGNOSIS — M059 Rheumatoid arthritis with rheumatoid factor, unspecified: Secondary | ICD-10-CM | POA: Insufficient documentation

## 2020-05-25 LAB — COMPREHENSIVE METABOLIC PANEL
ALT: 11 U/L (ref 0–44)
AST: 17 U/L (ref 15–41)
Albumin: 3.8 g/dL (ref 3.5–5.0)
Alkaline Phosphatase: 57 U/L (ref 38–126)
Anion gap: 13 (ref 5–15)
BUN: 16 mg/dL (ref 8–23)
CO2: 26 mmol/L (ref 22–32)
Calcium: 9.2 mg/dL (ref 8.9–10.3)
Chloride: 101 mmol/L (ref 98–111)
Creatinine, Ser: 0.79 mg/dL (ref 0.44–1.00)
GFR, Estimated: >60 mL/min (ref >60)
Glucose, Bld: 93 mg/dL (ref 70–99)
Potassium: 4 mmol/L (ref 3.5–5.1)
Sodium: 140 mmol/L (ref 135–145)
Total Bilirubin: 0.4 mg/dL (ref 0.3–1.2)
Total Protein: 6.3 g/dL — ABNORMAL LOW (ref 6.5–8.1)

## 2020-05-25 LAB — CBC
HCT: 39.2 % (ref 36.0–46.0)
Hemoglobin: 13.1 g/dL (ref 12.0–15.0)
MCH: 32.8 pg (ref 26.0–34.0)
MCHC: 33.4 g/dL (ref 30.0–36.0)
MCV: 98.2 fL (ref 80.0–100.0)
Platelets: 302 10*3/uL (ref 150–400)
RBC: 3.99 MIL/uL (ref 3.87–5.11)
RDW: 12.5 % (ref 11.5–15.5)
WBC: 5.9 10*3/uL (ref 4.0–10.5)
nRBC: 0 % (ref 0.0–0.2)

## 2020-05-25 MED ORDER — SODIUM CHLORIDE 0.9 % IV SOLN: Freq: Once | INTRAVENOUS | Status: AC

## 2020-05-25 MED ORDER — SODIUM CHLORIDE 0.9 % IV SOLN
1000.0000 mg | Freq: Once | INTRAVENOUS | Status: AC
  Administered 2020-05-25: 1000 mg via INTRAVENOUS
  Filled 2020-05-25: qty 40

## 2020-05-25 NOTE — Progress Notes (Signed)
Protein is low due to chronic disease.  CBC is normal.

## 2020-06-07 NOTE — Progress Notes (Signed)
Office Visit Note  Patient: Monica [name]             Date of Birth: 02/20/1941           MRN: [id number]             PCP: Juluis Rainier, MD Referring: Juluis Rainier, MD Visit Date: 06/16/2020 Occupation: @GUAROCC @  Subjective:  Joint pain and stiffness.   History of Present Illness: Monica [name] is a 80 y.o. female with history of rheumatoid arthritis, and osteoarthritis.  She states she continues to have pain and discomfort in multiple joints.  She states she has stiffness in her bilateral hands which she describes mostly over CMC joints.  She states her knee joints and hip joints continue to hurt and her gait is not very stable.  She has discomfort in her thoracic and lumbar spine off and on when she is doing groceries.  She denies any history of joint swelling.  She has been getting Orencia infusions on a regular basis.  Activities of Daily Living:  Patient reports morning stiffness for 2-3 hours.   Patient Denies nocturnal pain.  Difficulty dressing/grooming: Denies Difficulty climbing stairs: Denies Difficulty getting out of chair: Reports Difficulty using hands for taps, buttons, cutlery, and/or writing: Reports  Review of Systems  Constitutional: Positive for fatigue. Negative for night sweats, weight gain and weight loss.  HENT: Negative for mouth sores, trouble swallowing, trouble swallowing, mouth dryness and nose dryness.   Eyes: Negative for pain, redness, visual disturbance and dryness.  Respiratory: Positive for shortness of breath. Negative for cough and difficulty breathing.        H/o asthma  Cardiovascular: Negative for chest pain, palpitations, hypertension, irregular heartbeat and swelling in legs/feet.  Gastrointestinal: Negative for blood in stool, constipation and diarrhea.  Endocrine: Negative for increased urination.  Genitourinary: Negative for vaginal dryness.  Musculoskeletal: Positive for arthralgias, joint pain and morning stiffness.  Negative for joint swelling, myalgias, muscle weakness, muscle tenderness and myalgias.  Skin: Negative for color change, rash, hair loss, skin tightness, ulcers and sensitivity to sunlight.  Allergic/Immunologic: Negative for susceptible to infections.  Neurological: Negative for dizziness, memory loss, night sweats and weakness.  Hematological: Negative for swollen glands.  Psychiatric/Behavioral: Positive for depressed mood and sleep disturbance. The patient is nervous/anxious.     PMFS History:  Patient Active Problem List   Diagnosis Date Noted  . Asthmatic bronchitis 02/16/2019  . Osteopenia 12/20/2016  . History of depression 12/11/2016  . High risk medication use 05/15/2016  . Primary osteoarthritis of both hands 05/15/2016  . Primary osteoarthritis of both feet 05/15/2016  . Primary osteoarthritis of both knees 05/15/2016  . Vitamin D deficiency 05/15/2016  . History of hypertension 05/15/2016  . History of hyperlipidemia 05/15/2016  . Rheumatoid arthritis with rheumatoid factor of multiple sites without organ or systems involvement (HCC) 01/10/2016  . Seasonal allergic rhinitis 10/03/2011  . HYPERLIPIDEMIA 12/31/2008  . HYPERTENSION 12/31/2008  . Mild persistent asthmatic bronchitis with exacerbation 12/31/2008    Past Medical History:  Diagnosis Date  . Arthritis   . 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 Hyperlipidemia   . Hypertension     Family History  Problem Relation Age of Onset  . Lung cancer Father   . Alzheimer's disease Mother    Past Surgical History:  Procedure Laterality Date  . CARPAL TUNNEL RELEASE    . FOOT SURGERY    . MOUTH SURGERY  Right 05/13/2020   Social History   Social History Narrative  . Not on file   Immunization History  Administered Date(s) Administered  . Fluad Quad(high Dose 65+) 02/13/2019, 02/18/2020  . Influenza Nasal 02/29/2012  . Influenza Split 03/02/2011  . Influenza, High Dose Seasonal PF  02/12/2017, 02/12/2018  . Influenza,inj,Quad PF,6+ Mos 02/25/2013, 02/23/2014  . Influenza-Unspecified 03/15/2015  . Moderna Sars-Covid-2 Vaccination 07/16/2019, 08/13/2019  . Pneumococcal Conjugate-13 02/25/2013     Objective: Vital Signs: BP (!) 141/87 (BP Location: Left Arm, Patient Position: Sitting, Cuff Size: Small)   Pulse 85   Resp 12   Ht 5' 2.5" (1.588 m)   Wt 228 lb 6.4 oz (103.6 kg)   BMI 41.11 kg/m    Physical Exam Vitals and nursing note reviewed.  Constitutional:      Appearance: She is well-developed and well-nourished.  HENT:     Head: Normocephalic and atraumatic.  Eyes:     Extraocular Movements: EOM normal.     Conjunctiva/sclera: Conjunctivae normal.  Cardiovascular:     Rate and Rhythm: Normal rate and regular rhythm.     Pulses: Intact distal pulses.     Heart sounds: Normal heart sounds.  Pulmonary:     Effort: Pulmonary effort is normal.     Breath sounds: Normal breath sounds.  Abdominal:     General: Bowel sounds are normal.     Palpations: Abdomen is soft.  Musculoskeletal:     Cervical back: Normal range of motion.  Lymphadenopathy:     Cervical: No cervical adenopathy.  Skin:    General: Skin is warm and dry.     Capillary Refill: Capillary refill takes less than 2 seconds.  Neurological:     Mental Status: She is alert and oriented to person, place, and time.  Psychiatric:        Mood and Affect: Mood and affect normal.        Behavior: Behavior normal.      Musculoskeletal Exam: C-spine was in good range of motion.  She had not point tenderness over the thoracic or lumbar spine.  Shoulder joints, elbow joints, wrist joints with good range of motion.  She had no MCP swelling or synovitis.  She had thickening of bilateral PIP and DIP joints and also CMC prominence.  Hip joints and knee joints were in good range of motion.  She has limited extension of her knee joints.  There was no tenderness over ankles or MTPs.  CDAI Exam: CDAI  Score: 0.6  Patient Global: 4 mm; Provider Global: 2 mm Swollen: 0 ; Tender: 0  Joint Exam 06/16/2020   No joint exam has been documented for this visit   There is currently no information documented on the homunculus. Go to the Rheumatology activity and complete the homunculus joint exam.  Investigation: No additional findings.  Imaging: No results found.  Recent Labs: Lab Results  Component Value Date   WBC 5.9 05/25/2020   HGB 13.1 05/25/2020   PLT 302 05/25/2020   NA 140 05/25/2020   K 4.0 05/25/2020   CL 101 05/25/2020   CO2 26 05/25/2020   GLUCOSE 93 05/25/2020   BUN 16 05/25/2020   CREATININE 0.79 05/25/2020   BILITOT 0.4 05/25/2020   ALKPHOS 57 05/25/2020   AST 17 05/25/2020   ALT 11 05/25/2020   PROT 6.3 (L) 05/25/2020   ALBUMIN 3.8 05/25/2020   CALCIUM 9.2 05/25/2020   GFRAA >60 01/27/2020   QFTBGOLD Negative 07/24/2016   QFTBGOLDPLUS  NEGATIVE 07/30/2019    Speciality Comments: PLQ eye exam: 11/21/2019 @ [facility]. Follow up in 1 year. Orencia 1000 mg IV q 28days started on 09/2016 , last infusion 05/25/20 Prior therapy includes: Simponi Aria (inadequate response)  Procedures:  No procedures performed Allergies: Arava [leflunomide] and Piroxicam   Assessment / Plan:     Visit Diagnoses: Rheumatoid arthritis with rheumatoid factor of multiple sites without organ or systems involvement (HCC)-she complains of pain and discomfort in multiple joints.  No synovitis was noted.  I believe most of her discomfort is coming from underlying osteoarthritis.  She has been tolerating Orencia and Plaquenil without any side effects.  High risk medication use - Orencia IV 1000 mg every 28 days and Plaquenil 200 mg in the morning and 100 mg at bedtime. PLQ eye exam: 11/21/2019.  Labs done on May 25, 2020 were reviewed which were within normal limits.- Plan: QuantiFERON-TB Gold Plus today.  Primary osteoarthritis of both hands - x-rays were consistent with  osteoarthritis and rheumatoid arthritis overlap.  Erosive changes were noted in the left wrist in October 2020.Marland Kitchen  Her discomfort is mostly over the [facility] joints.  Joint protection muscle strengthening was discussed.  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  She has severe osteoarthritis and incomplete extension of her knee joints.  She has difficulty walking.  A prescription for walking cane was given.  Primary osteoarthritis of both feet - X-rays were consistent with rheumatoid arthritis and osteoarthritis overlap.  She has dorsal spurs.  She continues to have some stiffness and discomfort in her knee joints.  Osteopenia of multiple sites - Last DEXA on 11/06/2018 showed T-score -2.0 at lumbar spine and right hip had -11% change in BMD.  Patient will get repeat DEXA in July this year.  We can determine future treatment based on it.  History of vitamin D deficiency  History of hyperlipidemia  History of hypertension-blood pressure is elevated today.  She is been advised to blood pressure closely and follow-up with her PCP.  History of depression  History of asthma  Educated about COVID-19 virus infection-patient had initial 2 vaccines against COVID-19.  Have advised her to get a third vaccine dose and also booster 6 months later.  Instructions were placed in the AVS.  She is not convinced if she will get the future vaccine doses.  Risk of not getting vaccination was discussed.  Use of mask, social distancing and hand hygiene was discussed.  Orders: Orders Placed This Encounter  Procedures  . QuantiFERON-TB Gold Plus   No orders of the defined types were placed in this encounter.    Follow-Up Instructions: Return in about 5 months (around 11/13/2020) for Rheumatoid arthritis, Osteoarthritis.   Pollyann Savoy, MD  Note - This record has been created using Animal nutritionist.  Chart creation errors have been sought, but may  not always  have been located. Such creation errors do not reflect on  the standard of medical care.

## 2020-06-16 ENCOUNTER — Other Ambulatory Visit: Payer: Self-pay

## 2020-06-16 ENCOUNTER — Encounter: Payer: Self-pay | Admitting: Rheumatology

## 2020-06-16 ENCOUNTER — Ambulatory Visit (INDEPENDENT_AMBULATORY_CARE_PROVIDER_SITE_OTHER): Payer: Medicare Other | Admitting: Rheumatology

## 2020-06-16 VITALS — BP 141/87 | HR 85 | Resp 12 | Ht 62.5 in | Wt 228.4 lb

## 2020-06-16 DIAGNOSIS — Z8709 Personal history of other diseases of the respiratory system: Secondary | ICD-10-CM

## 2020-06-16 DIAGNOSIS — Z8659 Personal history of other mental and behavioral disorders: Secondary | ICD-10-CM

## 2020-06-16 DIAGNOSIS — M19071 Primary osteoarthritis, right ankle and foot: Secondary | ICD-10-CM | POA: Diagnosis not present

## 2020-06-16 DIAGNOSIS — Z7189 Other specified counseling: Secondary | ICD-10-CM

## 2020-06-16 DIAGNOSIS — Z8639 Personal history of other endocrine, nutritional and metabolic disease: Secondary | ICD-10-CM | POA: Diagnosis not present

## 2020-06-16 DIAGNOSIS — Z8679 Personal history of other diseases of the circulatory system: Secondary | ICD-10-CM

## 2020-06-16 DIAGNOSIS — M19041 Primary osteoarthritis, right hand: Secondary | ICD-10-CM

## 2020-06-16 DIAGNOSIS — M19042 Primary osteoarthritis, left hand: Secondary | ICD-10-CM

## 2020-06-16 DIAGNOSIS — M8589 Other specified disorders of bone density and structure, multiple sites: Secondary | ICD-10-CM

## 2020-06-16 DIAGNOSIS — M170 Bilateral primary osteoarthritis of knee: Secondary | ICD-10-CM | POA: Diagnosis not present

## 2020-06-16 DIAGNOSIS — M0579 Rheumatoid arthritis with rheumatoid factor of multiple sites without organ or systems involvement: Secondary | ICD-10-CM

## 2020-06-16 DIAGNOSIS — M19072 Primary osteoarthritis, left ankle and foot: Secondary | ICD-10-CM

## 2020-06-16 DIAGNOSIS — Z79899 Other long term (current) drug therapy: Secondary | ICD-10-CM | POA: Diagnosis not present

## 2020-06-16 NOTE — Patient Instructions (Signed)
COVID-19 vaccine recommendations:   COVID-19 vaccine is recommended for everyone (unless you are allergic to a vaccine component), even if you are on a medication that suppresses your immune system.   For the immunocompromised individuals recommendations are to get COVID-19 vaccine 1 month apart a total of 3 doses and then a fourth dose (booster) 6 months from the third vaccine.   If you are on Orencia IV infusions- time vaccination administration so that the first COVID-19 vaccination will occur four weeks after the infusion and postpone the subsequent infusion by one week.   Do not take Tylenol or any anti-inflammatory medications (NSAIDs) 24 hours prior to the COVID-19 vaccination.   There is no direct evidence about the efficacy of the COVID-19 vaccine in individuals who are on medications that suppress the immune system.   Even if you are fully vaccinated, and you are on any medications that suppress your immune system, please continue to wear a mask, maintain at least six feet social distance and practice hand hygiene.   If you develop a COVID-19 infection, please contact your PCP or our office to determine if you need monoclonal antibody infusion.  The booster vaccine is now available for immunocompromised patients.   Please see the following web sites for updated information.   [email or web address]

## 2020-06-18 LAB — QUANTIFERON-TB GOLD PLUS
Mitogen-NIL: >10 IU/mL
NIL: 0.04 IU/mL
QuantiFERON-TB Gold Plus: NEGATIVE
TB1-NIL: 0.01 IU/mL
TB2-NIL: 0.01 IU/mL

## 2020-06-22 ENCOUNTER — Other Ambulatory Visit: Payer: Self-pay

## 2020-06-22 ENCOUNTER — Encounter (HOSPITAL_COMMUNITY): Payer: Self-pay

## 2020-06-22 ENCOUNTER — Encounter (HOSPITAL_COMMUNITY)
Admission: RE | Admit: 2020-06-22 | Discharge: 2020-06-22 | Disposition: A | Discharge status: Home / Self Care | Payer: Medicare Other | Source: Ambulatory Visit | Attending: Rheumatology | Admitting: Rheumatology

## 2020-06-22 DIAGNOSIS — M0579 Rheumatoid arthritis with rheumatoid factor of multiple sites without organ or systems involvement: Secondary | ICD-10-CM | POA: Diagnosis not present

## 2020-06-22 MED ORDER — SODIUM CHLORIDE 0.9 % IV SOLN
1000.0000 mg | Freq: Once | INTRAVENOUS | Status: AC
  Administered 2020-06-22: 1000 mg via INTRAVENOUS
  Filled 2020-06-22: qty 40

## 2020-06-22 MED ORDER — ACETAMINOPHEN 325 MG PO TABS: 650.0000 mg | ORAL_TABLET | Freq: Once | ORAL | Status: DC

## 2020-06-22 MED ORDER — DIPHENHYDRAMINE HCL 25 MG PO CAPS: 25.0000 mg | ORAL_CAPSULE | Freq: Once | ORAL | Status: DC

## 2020-06-22 MED ORDER — SODIUM CHLORIDE 0.9 % IV SOLN: Freq: Once | INTRAVENOUS | Status: AC

## 2020-06-28 DIAGNOSIS — M069 Rheumatoid arthritis, unspecified: Secondary | ICD-10-CM | POA: Diagnosis not present

## 2020-06-28 DIAGNOSIS — J45909 Unspecified asthma, uncomplicated: Secondary | ICD-10-CM | POA: Diagnosis not present

## 2020-06-28 DIAGNOSIS — E7800 Pure hypercholesterolemia, unspecified: Secondary | ICD-10-CM | POA: Diagnosis not present

## 2020-06-28 DIAGNOSIS — I10 Essential (primary) hypertension: Secondary | ICD-10-CM | POA: Diagnosis not present

## 2020-06-28 DIAGNOSIS — F339 Major depressive disorder, recurrent, unspecified: Secondary | ICD-10-CM | POA: Diagnosis not present

## 2020-06-30 ENCOUNTER — Other Ambulatory Visit: Payer: Self-pay | Admitting: Rheumatology

## 2020-06-30 DIAGNOSIS — M0579 Rheumatoid arthritis with rheumatoid factor of multiple sites without organ or systems involvement: Secondary | ICD-10-CM

## 2020-06-30 NOTE — Telephone Encounter (Signed)
Last Visit: 06/16/2020 Next Visit: 12/01/2020 Labs: 05/25/2020, Protein is low due to chronic disease. 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

## 2020-06-30 NOTE — Telephone Encounter (Signed)
CBC has not been drawn with diff x 2. Can you please check the lab order? Thank you.

## 2020-07-01 NOTE — Telephone Encounter (Signed)
Will place new Orencia orders with CBC w/diff/platelet and CMP w/GFR.

## 2020-07-02 ENCOUNTER — Other Ambulatory Visit: Payer: Self-pay | Admitting: Pharmacist

## 2020-07-02 DIAGNOSIS — Z79899 Other long term (current) drug therapy: Secondary | ICD-10-CM

## 2020-07-02 DIAGNOSIS — M0579 Rheumatoid arthritis with rheumatoid factor of multiple sites without organ or systems involvement: Secondary | ICD-10-CM

## 2020-07-02 NOTE — Progress Notes (Signed)
Next infusion scheduled for Orencia on 07/20/20 and due for updated orders.  Dose: Orencia 1000mg  every 28 days  Last Clinic Visit: 06/16/20 Next Clinic Visit: 12/01/20  Last infusion:  Labs: 05/25/20 - CBC: wnl - CMP: wnl TB Gold: negative 06/16/20  Orders placed for Orencia 1000mg  x 3 doses along with premedication of Tylenol and Benadryl to be administered 30 minutes before medication infusion.  Standing CBC with diff/platelet and CMP with GFR orders placed to be drawn every 2 months.  Next TB gold due 06/16/21 (monitored yearly)  Called patient and provided with phone number for [facility] Medical Day [phone])  [facility], PharmD, MPH Clinical Pharmacist (Rheumatology and Pulmonology)

## 2020-07-20 ENCOUNTER — Encounter (HOSPITAL_COMMUNITY)
Admission: RE | Admit: 2020-07-20 | Discharge: 2020-07-20 | Disposition: A | Discharge status: Home / Self Care | Payer: Medicare Other | Source: Ambulatory Visit | Attending: Rheumatology | Admitting: Rheumatology

## 2020-07-20 ENCOUNTER — Encounter (HOSPITAL_COMMUNITY): Payer: Self-pay

## 2020-07-20 ENCOUNTER — Other Ambulatory Visit: Payer: Self-pay

## 2020-07-20 DIAGNOSIS — Z79899 Other long term (current) drug therapy: Secondary | ICD-10-CM | POA: Insufficient documentation

## 2020-07-20 DIAGNOSIS — M0579 Rheumatoid arthritis with rheumatoid factor of multiple sites without organ or systems involvement: Secondary | ICD-10-CM | POA: Diagnosis not present

## 2020-07-20 LAB — COMPREHENSIVE METABOLIC PANEL
ALT: 11 U/L (ref 0–44)
AST: 17 U/L (ref 15–41)
Albumin: 3.7 g/dL (ref 3.5–5.0)
Alkaline Phosphatase: 55 U/L (ref 38–126)
Anion gap: 10 (ref 5–15)
BUN: 18 mg/dL (ref 8–23)
CO2: 25 mmol/L (ref 22–32)
Calcium: 9 mg/dL (ref 8.9–10.3)
Chloride: 103 mmol/L (ref 98–111)
Creatinine, Ser: 0.77 mg/dL (ref 0.44–1.00)
GFR, Estimated: >60 mL/min (ref >60)
Glucose, Bld: 93 mg/dL (ref 70–99)
Potassium: 3.5 mmol/L (ref 3.5–5.1)
Sodium: 138 mmol/L (ref 135–145)
Total Bilirubin: 0.6 mg/dL (ref 0.3–1.2)
Total Protein: 6.9 g/dL (ref 6.5–8.1)

## 2020-07-20 LAB — CBC WITH DIFFERENTIAL/PLATELET
Abs Immature Granulocytes: 0.01 10*3/uL (ref 0.00–0.07)
Basophils Absolute: 0 10*3/uL (ref 0.0–0.1)
Basophils Relative: 1 %
Eosinophils Absolute: 0.2 10*3/uL (ref 0.0–0.5)
Eosinophils Relative: 3 %
HCT: 40.2 % (ref 36.0–46.0)
Hemoglobin: 13.3 g/dL (ref 12.0–15.0)
Immature Granulocytes: 0 %
Lymphocytes Relative: 27 %
Lymphs Abs: 1.7 10*3/uL (ref 0.7–4.0)
MCH: 32.4 pg (ref 26.0–34.0)
MCHC: 33.1 g/dL (ref 30.0–36.0)
MCV: 97.8 fL (ref 80.0–100.0)
Monocytes Absolute: 0.6 10*3/uL (ref 0.1–1.0)
Monocytes Relative: 9 %
Neutro Abs: 3.7 10*3/uL (ref 1.7–7.7)
Neutrophils Relative %: 60 %
Platelets: 284 10*3/uL (ref 150–400)
RBC: 4.11 MIL/uL (ref 3.87–5.11)
RDW: 12.6 % (ref 11.5–15.5)
WBC: 6.1 10*3/uL (ref 4.0–10.5)
nRBC: 0 % (ref 0.0–0.2)

## 2020-07-20 MED ORDER — SODIUM CHLORIDE 0.9 % IV SOLN: Freq: Once | INTRAVENOUS | Status: AC

## 2020-07-20 MED ORDER — ACETAMINOPHEN 325 MG PO TABS: 650.0000 mg | ORAL_TABLET | ORAL | Status: DC

## 2020-07-20 MED ORDER — DIPHENHYDRAMINE HCL 25 MG PO CAPS: 25.0000 mg | ORAL_CAPSULE | ORAL | Status: DC

## 2020-07-20 MED ORDER — SODIUM CHLORIDE 0.9 % IV SOLN
1000.0000 mg | INTRAVENOUS | Status: DC
  Administered 2020-07-20: 1000 mg via INTRAVENOUS
  Filled 2020-07-20: qty 40

## 2020-07-20 NOTE — Progress Notes (Signed)
CBC and CMP WNL

## 2020-08-06 DIAGNOSIS — Z23 Encounter for immunization: Secondary | ICD-10-CM | POA: Diagnosis not present

## 2020-08-17 ENCOUNTER — Encounter (HOSPITAL_COMMUNITY)
Admission: RE | Admit: 2020-08-17 | Discharge: 2020-08-17 | Disposition: A | Discharge status: Home / Self Care | Payer: Medicare Other | Source: Ambulatory Visit | Attending: Rheumatology | Admitting: Rheumatology

## 2020-08-17 ENCOUNTER — Other Ambulatory Visit: Payer: Self-pay

## 2020-08-17 ENCOUNTER — Encounter (HOSPITAL_COMMUNITY): Payer: Self-pay

## 2020-08-17 DIAGNOSIS — M0579 Rheumatoid arthritis with rheumatoid factor of multiple sites without organ or systems involvement: Secondary | ICD-10-CM | POA: Diagnosis not present

## 2020-08-17 MED ORDER — DIPHENHYDRAMINE HCL 25 MG PO CAPS: 25.0000 mg | ORAL_CAPSULE | Freq: Once | ORAL | Status: DC

## 2020-08-17 MED ORDER — SODIUM CHLORIDE 0.9 % IV SOLN: Freq: Once | INTRAVENOUS | Status: AC

## 2020-08-17 MED ORDER — ACETAMINOPHEN 325 MG PO TABS: 650.0000 mg | ORAL_TABLET | Freq: Once | ORAL | Status: DC

## 2020-08-17 MED ORDER — SODIUM CHLORIDE 0.9 % IV SOLN
1000.0000 mg | Freq: Once | INTRAVENOUS | Status: AC
  Administered 2020-08-17: 1000 mg via INTRAVENOUS
  Filled 2020-08-17: qty 40

## 2020-09-01 DIAGNOSIS — Z0000 Encounter for general adult medical examination without abnormal findings: Secondary | ICD-10-CM | POA: Diagnosis not present

## 2020-09-01 DIAGNOSIS — Z1389 Encounter for screening for other disorder: Secondary | ICD-10-CM | POA: Diagnosis not present

## 2020-09-13 ENCOUNTER — Other Ambulatory Visit: Payer: Self-pay | Admitting: Pharmacist

## 2020-09-13 DIAGNOSIS — M0579 Rheumatoid arthritis with rheumatoid factor of multiple sites without organ or systems involvement: Secondary | ICD-10-CM

## 2020-09-13 DIAGNOSIS — Z79899 Other long term (current) drug therapy: Secondary | ICD-10-CM

## 2020-09-13 NOTE — Progress Notes (Signed)
Next infusion scheduled for Orencia on 09/14/20 and due for updated orders.  Diagnosis: rheumatoid arthritis  Dose: Orencia 1000mg  every 28 days (appropriate based on last recorded weight of 103.6 kg)  Last Clinic Visit: 06/16/20 Next Clinic Visit: 12/01/20  Last infusion: 08/17/20 Labs: CBC and CMP wnl on 07/20/20 TB Gold: negative on 06/16/20  Orders placed for Orencia 1000mg  x 3 doses along with premedication of Tylenol and Benadryl to be administered 30 minutes before medication infusion.  Standing CBC with diff/platelet and CMP with GFR orders placed to be drawn every 2 months.  Next TB gold due February 2023  , PharmD, MPH Clinical Pharmacist (Rheumatology and Pulmonology)

## 2020-09-14 ENCOUNTER — Other Ambulatory Visit: Payer: Self-pay

## 2020-09-14 ENCOUNTER — Encounter (HOSPITAL_COMMUNITY)
Admission: RE | Admit: 2020-09-14 | Discharge: 2020-09-14 | Disposition: A | Discharge status: Home / Self Care | Payer: Medicare Other | Source: Ambulatory Visit | Attending: Rheumatology | Admitting: Rheumatology

## 2020-09-14 DIAGNOSIS — Z79899 Other long term (current) drug therapy: Secondary | ICD-10-CM | POA: Insufficient documentation

## 2020-09-14 DIAGNOSIS — M0579 Rheumatoid arthritis with rheumatoid factor of multiple sites without organ or systems involvement: Secondary | ICD-10-CM | POA: Insufficient documentation

## 2020-09-14 LAB — COMPREHENSIVE METABOLIC PANEL
ALT: 12 U/L (ref 0–44)
AST: 17 U/L (ref 15–41)
Albumin: 3.9 g/dL (ref 3.5–5.0)
Alkaline Phosphatase: 67 U/L (ref 38–126)
Anion gap: 8 (ref 5–15)
BUN: 13 mg/dL (ref 8–23)
CO2: 27 mmol/L (ref 22–32)
Calcium: 8.9 mg/dL (ref 8.9–10.3)
Chloride: 104 mmol/L (ref 98–111)
Creatinine, Ser: 0.73 mg/dL (ref 0.44–1.00)
GFR, Estimated: >60 mL/min (ref >60)
Glucose, Bld: 91 mg/dL (ref 70–99)
Potassium: 4 mmol/L (ref 3.5–5.1)
Sodium: 139 mmol/L (ref 135–145)
Total Bilirubin: 0.7 mg/dL (ref 0.3–1.2)
Total Protein: 7 g/dL (ref 6.5–8.1)

## 2020-09-14 LAB — CBC WITH DIFFERENTIAL/PLATELET
Abs Immature Granulocytes: 0.01 10*3/uL (ref 0.00–0.07)
Basophils Absolute: 0.1 10*3/uL (ref 0.0–0.1)
Basophils Relative: 1 %
Eosinophils Absolute: 0.2 10*3/uL (ref 0.0–0.5)
Eosinophils Relative: 4 %
HCT: 41.2 % (ref 36.0–46.0)
Hemoglobin: 13.5 g/dL (ref 12.0–15.0)
Immature Granulocytes: 0 %
Lymphocytes Relative: 30 %
Lymphs Abs: 1.9 10*3/uL (ref 0.7–4.0)
MCH: 32.6 pg (ref 26.0–34.0)
MCHC: 32.8 g/dL (ref 30.0–36.0)
MCV: 99.5 fL (ref 80.0–100.0)
Monocytes Absolute: 0.6 10*3/uL (ref 0.1–1.0)
Monocytes Relative: 9 %
Neutro Abs: 3.7 10*3/uL (ref 1.7–7.7)
Neutrophils Relative %: 56 %
Platelets: 314 10*3/uL (ref 150–400)
RBC: 4.14 MIL/uL (ref 3.87–5.11)
RDW: 12.7 % (ref 11.5–15.5)
WBC: 6.5 10*3/uL (ref 4.0–10.5)
nRBC: 0 % (ref 0.0–0.2)

## 2020-09-14 MED ORDER — ABATACEPT 250 MG IV SOLR
1000.0000 mg | INTRAVENOUS | Status: DC
  Administered 2020-09-14: 1000 mg via INTRAVENOUS
  Filled 2020-09-14: qty 40

## 2020-09-14 MED ORDER — SODIUM CHLORIDE 0.9 % IV SOLN: INTRAVENOUS | Status: DC

## 2020-09-14 NOTE — Progress Notes (Signed)
CBC with diff WNL

## 2020-09-14 NOTE — Progress Notes (Signed)
CMP WNL

## 2020-09-27 ENCOUNTER — Other Ambulatory Visit: Payer: Self-pay | Admitting: Physician Assistant

## 2020-09-27 DIAGNOSIS — M0579 Rheumatoid arthritis with rheumatoid factor of multiple sites without organ or systems involvement: Secondary | ICD-10-CM

## 2020-09-28 NOTE — Telephone Encounter (Signed)
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

## 2020-10-11 ENCOUNTER — Telehealth: Payer: Self-pay

## 2020-10-11 NOTE — Telephone Encounter (Signed)
Eber Jones from [facility] called stating patient is scheduled for Orencia infusion tomorrow 10/12/20 and there are no orders in Epic.  Please advise.

## 2020-10-11 NOTE — Telephone Encounter (Signed)
Orders were placed on 09/13/20 for 3 doses of Orencia. Returned call to [place] to advise. She states she is unable to check on her end and team will reach out to our clinic tomorrow morning if they are unable to find Orencia orders.  Patient received an infusion the following day on 09/14/20 without issue  Chesley Mires, PharmD, MPH Clinical Pharmacist (Rheumatology and Pulmonology)

## 2020-10-12 ENCOUNTER — Other Ambulatory Visit: Payer: Self-pay | Admitting: Pharmacist

## 2020-10-12 ENCOUNTER — Encounter (HOSPITAL_COMMUNITY)
Admission: RE | Admit: 2020-10-12 | Discharge: 2020-10-12 | Disposition: A | Discharge status: Home / Self Care | Payer: Medicare Other | Source: Ambulatory Visit | Attending: Rheumatology | Admitting: Rheumatology

## 2020-10-12 ENCOUNTER — Other Ambulatory Visit: Payer: Self-pay

## 2020-10-12 ENCOUNTER — Encounter (HOSPITAL_COMMUNITY): Payer: Self-pay

## 2020-10-12 DIAGNOSIS — M0579 Rheumatoid arthritis with rheumatoid factor of multiple sites without organ or systems involvement: Secondary | ICD-10-CM | POA: Insufficient documentation

## 2020-10-12 DIAGNOSIS — Z79899 Other long term (current) drug therapy: Secondary | ICD-10-CM

## 2020-10-12 MED ORDER — SODIUM CHLORIDE 0.9 % IV SOLN
1000.0000 mg | INTRAVENOUS | Status: DC
  Administered 2020-10-12: 1000 mg via INTRAVENOUS
  Filled 2020-10-12: qty 40

## 2020-10-12 NOTE — Progress Notes (Signed)
Next infusion scheduled for Orencia today, 10/12/20, and due for updated orders.  Diagnosis: rheumatoid arthritis  Dose: Orencia 1000mg  every 28 days (appropriate based on last recorded weight of 103.6 kg)  Last Clinic Visit: 06/16/20 Next Clinic Visit: 12/01/20  Last infusion: 09/14/20 Labs: CBC and CMP wnl on 09/14/20 TB Gold: negative on 06/16/20  Orders placed for Orencia 1000mg  x 2 doses along with premedication of Tylenol and Benadryl to be administered 30 minutes before medication infusion.  Standing CBC with diff/platelet and CMP with GFR orders placed to be drawn every 2 months.  Next TB gold due February 2023  , PharmD, MPH Clinical Pharmacist (Rheumatology and Pulmonology)

## 2020-10-13 DIAGNOSIS — M159 Polyosteoarthritis, unspecified: Secondary | ICD-10-CM | POA: Diagnosis not present

## 2020-10-13 DIAGNOSIS — J4530 Mild persistent asthma, uncomplicated: Secondary | ICD-10-CM | POA: Diagnosis not present

## 2020-10-13 DIAGNOSIS — I10 Essential (primary) hypertension: Secondary | ICD-10-CM | POA: Diagnosis not present

## 2020-10-13 DIAGNOSIS — E7800 Pure hypercholesterolemia, unspecified: Secondary | ICD-10-CM | POA: Diagnosis not present

## 2020-10-13 DIAGNOSIS — F3341 Major depressive disorder, recurrent, in partial remission: Secondary | ICD-10-CM | POA: Diagnosis not present

## 2020-10-13 DIAGNOSIS — M069 Rheumatoid arthritis, unspecified: Secondary | ICD-10-CM | POA: Diagnosis not present

## 2020-10-13 DIAGNOSIS — M8580 Other specified disorders of bone density and structure, unspecified site: Secondary | ICD-10-CM | POA: Diagnosis not present

## 2020-10-13 DIAGNOSIS — R7303 Prediabetes: Secondary | ICD-10-CM | POA: Diagnosis not present

## 2020-10-19 ENCOUNTER — Other Ambulatory Visit: Payer: Self-pay | Admitting: *Deleted

## 2020-10-19 DIAGNOSIS — M0579 Rheumatoid arthritis with rheumatoid factor of multiple sites without organ or systems involvement: Secondary | ICD-10-CM

## 2020-10-19 DIAGNOSIS — M8589 Other specified disorders of bone density and structure, multiple sites: Secondary | ICD-10-CM

## 2020-11-05 ENCOUNTER — Other Ambulatory Visit: Payer: Self-pay | Admitting: Pharmacist

## 2020-11-05 ENCOUNTER — Telehealth: Payer: Self-pay

## 2020-11-05 DIAGNOSIS — M0579 Rheumatoid arthritis with rheumatoid factor of multiple sites without organ or systems involvement: Secondary | ICD-10-CM

## 2020-11-05 DIAGNOSIS — Z79899 Other long term (current) drug therapy: Secondary | ICD-10-CM

## 2020-11-05 NOTE — Telephone Encounter (Signed)
Becky from [facility] called stating patient is scheduled for Orencia infusion on 11/09/20 and they need orders put in Epic.  If you have any questions, please call back at #[phone]

## 2020-11-05 NOTE — Progress Notes (Signed)
Next infusion scheduled for Orencia on 11/09/20 and due for updated orders. Diagnosis: rheumatoid arthritis  Dose: Orencia 1000mg  every 28 days (appropriate based on last recorded eight of 103.6 kg)  Last Clinic Visit: 06/16/20 Next Clinic Visit: 12/01/20  Last infusion: 10/12/20  Labs: 09/14/20 CBC and CMP wnl TB Gold: negative on 06/16/20   Orders placed for Orencia 100mg  x 3 doses along with premedication of Tylenol and Benadryl to be administered 30 minutes before medication infusion.  Standing CBC with diff/platelet and CMP with GFR orders placed to be drawn every 2 months.  Next TB gold due Feb 2023  Patient receives at [facility], PharmD, MPH Clinical Pharmacist (Rheumatology and Pulmonology)

## 2020-11-05 NOTE — Telephone Encounter (Signed)
Orencia Medical Day orders placed today x 90 days.   Chesley Mires, PharmD, MPH Clinical Pharmacist (Rheumatology and Pulmonology)

## 2020-11-09 ENCOUNTER — Encounter (HOSPITAL_COMMUNITY)
Admission: RE | Admit: 2020-11-09 | Discharge: 2020-11-09 | Disposition: A | Discharge status: Home / Self Care | Payer: Medicare Other | Source: Ambulatory Visit | Attending: Rheumatology | Admitting: Rheumatology

## 2020-11-09 ENCOUNTER — Other Ambulatory Visit: Payer: Self-pay

## 2020-11-09 DIAGNOSIS — Z79899 Other long term (current) drug therapy: Secondary | ICD-10-CM | POA: Diagnosis not present

## 2020-11-09 DIAGNOSIS — M0579 Rheumatoid arthritis with rheumatoid factor of multiple sites without organ or systems involvement: Secondary | ICD-10-CM | POA: Insufficient documentation

## 2020-11-09 LAB — CBC WITH DIFFERENTIAL/PLATELET
Abs Immature Granulocytes: 0.02 10*3/uL (ref 0.00–0.07)
Basophils Absolute: 0.1 10*3/uL (ref 0.0–0.1)
Basophils Relative: 1 %
Eosinophils Absolute: 0.2 10*3/uL (ref 0.0–0.5)
Eosinophils Relative: 3 %
HCT: 39.8 % (ref 36.0–46.0)
Hemoglobin: 13 g/dL (ref 12.0–15.0)
Immature Granulocytes: 0 %
Lymphocytes Relative: 28 %
Lymphs Abs: 1.9 10*3/uL (ref 0.7–4.0)
MCH: 32.3 pg (ref 26.0–34.0)
MCHC: 32.7 g/dL (ref 30.0–36.0)
MCV: 99 fL (ref 80.0–100.0)
Monocytes Absolute: 0.6 10*3/uL (ref 0.1–1.0)
Monocytes Relative: 9 %
Neutro Abs: 4 10*3/uL (ref 1.7–7.7)
Neutrophils Relative %: 59 %
Platelets: 295 10*3/uL (ref 150–400)
RBC: 4.02 MIL/uL (ref 3.87–5.11)
RDW: 12.9 % (ref 11.5–15.5)
WBC: 6.8 10*3/uL (ref 4.0–10.5)
nRBC: 0 % (ref 0.0–0.2)

## 2020-11-09 LAB — COMPREHENSIVE METABOLIC PANEL
ALT: 13 U/L (ref 0–44)
AST: 16 U/L (ref 15–41)
Albumin: 3.8 g/dL (ref 3.5–5.0)
Alkaline Phosphatase: 65 U/L (ref 38–126)
Anion gap: 9 (ref 5–15)
BUN: 13 mg/dL (ref 8–23)
CO2: 26 mmol/L (ref 22–32)
Calcium: 9 mg/dL (ref 8.9–10.3)
Chloride: 103 mmol/L (ref 98–111)
Creatinine, Ser: 0.73 mg/dL (ref 0.44–1.00)
GFR, Estimated: >60 mL/min (ref >60)
Glucose, Bld: 88 mg/dL (ref 70–99)
Potassium: 3.8 mmol/L (ref 3.5–5.1)
Sodium: 138 mmol/L (ref 135–145)
Total Bilirubin: 0.6 mg/dL (ref 0.3–1.2)
Total Protein: 6.8 g/dL (ref 6.5–8.1)

## 2020-11-09 MED ORDER — DIPHENHYDRAMINE HCL 25 MG PO CAPS: 25.0000 mg | ORAL_CAPSULE | ORAL | Status: DC

## 2020-11-09 MED ORDER — SODIUM CHLORIDE 0.9 % IV SOLN: INTRAVENOUS | Status: DC

## 2020-11-09 MED ORDER — ACETAMINOPHEN 325 MG PO TABS: 650.0000 mg | ORAL_TABLET | ORAL | Status: DC

## 2020-11-09 MED ORDER — SODIUM CHLORIDE 0.9 % IV SOLN
1000.0000 mg | Freq: Once | INTRAVENOUS | Status: AC
  Administered 2020-11-09: 1000 mg via INTRAVENOUS
  Filled 2020-11-09: qty 40

## 2020-11-09 NOTE — Progress Notes (Signed)
CBC and CMP WNL

## 2020-11-09 NOTE — Progress Notes (Signed)
Pt stated she took her tylenol and benadryl at home before coming to hospital.

## 2020-11-10 ENCOUNTER — Encounter: Payer: Self-pay | Admitting: Rheumatology

## 2020-11-10 DIAGNOSIS — Z1231 Encounter for screening mammogram for malignant neoplasm of breast: Secondary | ICD-10-CM | POA: Diagnosis not present

## 2020-11-10 DIAGNOSIS — Z780 Asymptomatic menopausal state: Secondary | ICD-10-CM | POA: Diagnosis not present

## 2020-11-10 DIAGNOSIS — M85852 Other specified disorders of bone density and structure, left thigh: Secondary | ICD-10-CM | POA: Diagnosis not present

## 2020-11-10 DIAGNOSIS — M85851 Other specified disorders of bone density and structure, right thigh: Secondary | ICD-10-CM | POA: Diagnosis not present

## 2020-11-12 ENCOUNTER — Telehealth: Payer: Self-pay

## 2020-11-12 NOTE — Telephone Encounter (Signed)
Received DEXA results from [facility].  Date of Scan: 11/10/2020 T-score: -1.9 BMD: 0.639 Lowest site measured: right femoral neck  Current Regimen:vitamin D   Recommendation:advise calcium (diet and supplement) 1200mg  daily with vitamin D and resistive exercises.   Reviewed by: Dr.   Next Appointment:  12/01/2020  Advised patient of bone density results and recommendation, patient verbalized understanding.

## 2020-11-16 ENCOUNTER — Other Ambulatory Visit: Payer: Self-pay | Admitting: Rheumatology

## 2020-11-16 NOTE — Telephone Encounter (Signed)
Last Visit:  06/16/2020  Next Visit: 12/01/2020  Last Fill: 07/07/2019  Per protocol, okay to refill per Dr. Corliss Skains

## 2020-11-17 NOTE — Progress Notes (Signed)
Office Visit Note  Patient: Monica [name]             Date of Birth: 07/30/1940           MRN: [id number]             PCP: Clayborn Heron, MD Referring: No ref. provider found Visit Date: 12/01/2020 Occupation: @GUAROCC @  Subjective:  Medication management  History of Present Illness: Monica [name] is a 80 y.o. female with a history of rheumatoid arthritis and osteoarthritis.  She states she has been doing well on the combination of Orencia and Plaquenil.  She states increased activity can cause increased discomfort.  She recently had increased swelling in her right index finger which subsided after taking a dose of ibuprofen.  She has been watching diet and has been trying to exercise on a regular basis.  Activities of Daily Living:  Patient reports morning stiffness for 2 hours.   Patient Reports nocturnal pain.  Difficulty dressing/grooming: Denies Difficulty climbing stairs: Reports Difficulty getting out of chair: Denies Difficulty using hands for taps, buttons, cutlery, and/or writing: Reports  Review of Systems  Constitutional:  Positive for fatigue.  HENT:  Positive for mouth dryness.   Eyes:  Positive for dryness.  Respiratory:  Positive for shortness of breath.   Cardiovascular:  Positive for swelling in legs/feet.  Gastrointestinal:  Positive for constipation.  Endocrine: Positive for cold intolerance.  Genitourinary:  Negative for difficulty urinating.  Musculoskeletal:  Positive for joint pain, joint pain, joint swelling, morning stiffness and muscle tenderness. Negative for gait problem.  Skin:  Negative for rash.  Allergic/Immunologic: Negative for susceptible to infections.  Neurological:  Positive for numbness.  Hematological:  Positive for bruising/bleeding tendency.  Psychiatric/Behavioral:  Negative for sleep disturbance.    PMFS History:  Patient Active Problem List   Diagnosis Date Noted   Asthmatic bronchitis 02/16/2019   Osteopenia  12/20/2016   History of depression 12/11/2016   High risk medication use 05/15/2016   Primary osteoarthritis of both hands 05/15/2016   Primary osteoarthritis of both feet 05/15/2016   Primary osteoarthritis of both knees 05/15/2016   Vitamin D deficiency 05/15/2016   History of hypertension 05/15/2016   History of hyperlipidemia 05/15/2016   Rheumatoid arthritis with rheumatoid factor of multiple sites without organ or systems involvement (HCC) 01/10/2016   Seasonal allergic rhinitis 10/03/2011   HYPERLIPIDEMIA 12/31/2008   HYPERTENSION 12/31/2008   Mild persistent asthmatic bronchitis with exacerbation 12/31/2008    Past Medical History:  Diagnosis Date   Arthritis    Asthma    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, 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%   Hyperlipidemia    Hypertension     Family History  Problem Relation Age of Onset   Lung cancer Father    Alzheimer's disease Mother    Past Surgical History:  Procedure Laterality Date   CARPAL TUNNEL RELEASE     FOOT SURGERY     MOUTH SURGERY Right 05/13/2020   Social History   Social History Narrative   Not on file   Immunization History  Administered Date(s) Administered   Fluad Quad(high Dose 65+) 02/13/2019, 02/18/2020   Influenza Nasal 02/29/2012   Influenza Split 03/02/2011   Influenza, High Dose Seasonal PF 02/12/2017, 02/12/2018   Influenza,inj,Quad PF,6+ Mos 02/25/2013, 02/23/2014   Influenza-Unspecified 03/15/2015   Moderna Sars-Covid-2 Vaccination 07/16/2019, 08/13/2019, 08/06/2020   Pneumococcal Conjugate-13 02/25/2013     Objective: Vital Signs: BP 136/78 (BP Location: Left Arm, Patient Position: Sitting,  Cuff Size: Large)   Pulse 67   Resp 13   Ht 5' 2.5" (1.588 m)   Wt 222 lb (100.7 kg)   BMI 39.96 kg/m    Physical Exam Vitals and nursing note reviewed.  Constitutional:      Appearance: She is well-developed.  HENT:     Head: Normocephalic and atraumatic.  Eyes:     Conjunctiva/sclera: Conjunctivae normal.   Cardiovascular:     Rate and Rhythm: Normal rate and regular rhythm.     Heart sounds: Normal heart sounds.  Pulmonary:     Effort: Pulmonary effort is normal.     Breath sounds: Normal breath sounds.  Abdominal:     General: Bowel sounds are normal.     Palpations: Abdomen is soft.  Musculoskeletal:     Cervical back: Normal range of motion.  Lymphadenopathy:     Cervical: No cervical adenopathy.  Skin:    General: Skin is warm and dry.     Capillary Refill: Capillary refill takes less than 2 seconds.  Neurological:     Mental Status: She is alert and oriented to person, place, and time.  Psychiatric:        Behavior: Behavior normal.     Musculoskeletal Exam: C-spine was in good range of motion.  Shoulder joints, elbow joints, wrist joints, MCPs PIPs and DIPs with good range of motion.  She had bilateral PIP and DIP thickening with no synovitis.  Hip joints and knee joints with good range of motion.  There is no tenderness over ankles or MTPs.  CDAI Exam: CDAI Score: 0.6  Patient Global: 3 mm; Provider Global: 3 mm Swollen: 0 ; Tender: 0  Joint Exam 12/01/2020   No joint exam has been documented for this visit   There is currently no information documented on the homunculus. Go to the Rheumatology activity and complete the homunculus joint exam.  Investigation: No additional findings.  Imaging: No results found.  Recent Labs: Lab Results  Component Value Date   WBC 6.8 11/09/2020   HGB 13.0 11/09/2020   PLT 295 11/09/2020   NA 138 11/09/2020   K 3.8 11/09/2020   CL 103 11/09/2020   CO2 26 11/09/2020   GLUCOSE 88 11/09/2020   BUN 13 11/09/2020   CREATININE 0.73 11/09/2020   BILITOT 0.6 11/09/2020   ALKPHOS 65 11/09/2020   AST 16 11/09/2020   ALT 13 11/09/2020   PROT 6.8 11/09/2020   ALBUMIN 3.8 11/09/2020   CALCIUM 9.0 11/09/2020   GFRAA >60 01/27/2020   QFTBGOLD Negative 07/24/2016   QFTBGOLDPLUS NEGATIVE 06/16/2020    Speciality Comments: PLQ eye  exam:11/23/2020 WNL @ [facility]. Follow up in 1 year.  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  She is doing well on the current combination.  High risk medication use - Orencia IV 1000 mg every 28 days and Plaquenil 200 mg in the morning and 100 mg at bedtime. PLQ eye exam: 11/23/2020.  She had COVID-19 booster in April.  Placed information regarding future dosing and other immunization recommendations.  She has been advised to stop Orencia in case she develops an infection and resume once infection resolves.  Primary osteoarthritis of both hands - x-rays were consistent with osteoarthritis and rheumatoid arthritis overlap.  Erosive changes were noted in the left wrist in October 2020.  She had no synovitis on examination today.  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  She continues to have some difficulty walking.  Primary osteoarthritis of both feet - X-rays were consistent with rheumatoid arthritis and osteoarthritis overlap.  She has dorsal spurs.  No synovitis was noted.  Osteopenia of multiple sites - DEXA 11/10/2020 T-score: -1.9, BMD: 0.639 right femoral neck. DEXA on 11/06/2018 showed T-score -2.0 at lumbar spine and right hip had -11% change in BMD.  I discussed x-ray results with the patient.  Use of calcium rich diet, vitamin D and resistive exercises were discussed.  History of vitamin D deficiency-she takes vitamin D 1000 units daily.  History of hyperlipidemia  History of hypertension-blood pressure is controlled.  History of asthma  History  of depression  Orders: No orders of the defined types were placed in this encounter.  No orders of the defined types were placed in this encounter.    Follow-Up Instructions: Return in about 5 months (around 05/03/2021) for Rheumatoid arthritis, Osteoarthritis.   Pollyann Savoy, MD  Note - This record has been created using Animal nutritionist.  Chart creation errors have been sought, but may not always  have been located. Such creation errors do not reflect on  the standard of medical care.

## 2020-11-23 ENCOUNTER — Encounter: Payer: Self-pay | Admitting: Rheumatology

## 2020-11-23 DIAGNOSIS — H2512 Age-related nuclear cataract, left eye: Secondary | ICD-10-CM | POA: Diagnosis not present

## 2020-11-23 DIAGNOSIS — H43813 Vitreous degeneration, bilateral: Secondary | ICD-10-CM | POA: Diagnosis not present

## 2020-11-23 DIAGNOSIS — H524 Presbyopia: Secondary | ICD-10-CM | POA: Diagnosis not present

## 2020-11-23 DIAGNOSIS — Z79899 Other long term (current) drug therapy: Secondary | ICD-10-CM | POA: Diagnosis not present

## 2020-12-01 ENCOUNTER — Ambulatory Visit (INDEPENDENT_AMBULATORY_CARE_PROVIDER_SITE_OTHER): Payer: Medicare Other | Admitting: Rheumatology

## 2020-12-01 ENCOUNTER — Ambulatory Visit: Payer: Medicare Other | Admitting: Physician Assistant

## 2020-12-01 ENCOUNTER — Encounter: Payer: Self-pay | Admitting: Rheumatology

## 2020-12-01 ENCOUNTER — Other Ambulatory Visit: Payer: Self-pay

## 2020-12-01 VITALS — BP 136/78 | HR 67 | Resp 13 | Ht 62.5 in | Wt 222.0 lb

## 2020-12-01 DIAGNOSIS — M8589 Other specified disorders of bone density and structure, multiple sites: Secondary | ICD-10-CM | POA: Diagnosis not present

## 2020-12-01 DIAGNOSIS — Z8659 Personal history of other mental and behavioral disorders: Secondary | ICD-10-CM

## 2020-12-01 DIAGNOSIS — Z8709 Personal history of other diseases of the respiratory system: Secondary | ICD-10-CM

## 2020-12-01 DIAGNOSIS — Z8639 Personal history of other endocrine, nutritional and metabolic disease: Secondary | ICD-10-CM

## 2020-12-01 DIAGNOSIS — M19072 Primary osteoarthritis, left ankle and foot: Secondary | ICD-10-CM

## 2020-12-01 DIAGNOSIS — M19042 Primary osteoarthritis, left hand: Secondary | ICD-10-CM | POA: Diagnosis not present

## 2020-12-01 DIAGNOSIS — M19071 Primary osteoarthritis, right ankle and foot: Secondary | ICD-10-CM | POA: Diagnosis not present

## 2020-12-01 DIAGNOSIS — Z8679 Personal history of other diseases of the circulatory system: Secondary | ICD-10-CM | POA: Diagnosis not present

## 2020-12-01 DIAGNOSIS — Z79899 Other long term (current) drug therapy: Secondary | ICD-10-CM

## 2020-12-01 DIAGNOSIS — M170 Bilateral primary osteoarthritis of knee: Secondary | ICD-10-CM | POA: Diagnosis not present

## 2020-12-01 DIAGNOSIS — M0579 Rheumatoid arthritis with rheumatoid factor of multiple sites without organ or systems involvement: Secondary | ICD-10-CM | POA: Diagnosis not present

## 2020-12-01 DIAGNOSIS — M19041 Primary osteoarthritis, right hand: Secondary | ICD-10-CM

## 2020-12-01 NOTE — Patient Instructions (Signed)
COVID-19 vaccine recommendations:   COVID-19 vaccine is recommended for everyone (unless you are allergic to a vaccine component), even if you are on a medication that suppresses your immune system.   If you are on Methotrexate, Cellcept (mycophenolate), Rinvoq, Xeljanz, and Olumiant- hold the medication for 1 week after each vaccine. Hold Methotrexate for 2 weeks after the single dose COVID-19 vaccine.   If you are on Orencia subcutaneous injection - hold medication one week prior to and one week after the first COVID-19 vaccine dose (only).   If you are on Orencia IV infusions- time vaccination administration so that the first COVID-19 vaccination will occur four weeks after the infusion and postpone the subsequent infusion by one week.   If you are on Cyclophosphamide or Rituxan infusions please contact your doctor prior to receiving the COVID-19 vaccine.   Do not take Tylenol or any anti-inflammatory medications (NSAIDs) 24 hours prior to the COVID-19 vaccination.   There is no direct evidence about the efficacy of the COVID-19 vaccine in individuals who are on medications that suppress the immune system.   Even if you are fully vaccinated, and you are on any medications that suppress your immune system, please continue to wear a mask, maintain at least six feet social distance and practice hand hygiene.   If you develop a COVID-19 infection, please contact your PCP or our office to determine if you need monoclonal antibody infusion.  The booster vaccine is now available for immunocompromised patients.   Please see the following web sites for updated information.   [email or web address]-Patient-Resources.[email or web address]  Vaccines You are taking a medication(s) that can suppress your immune system.  The following immunizations are recommended: Flu annually Covid-19  Td/Tdap (tetanus, diphtheria, pertussis) every 10 years Pneumonia (Prevnar 15 then  Pneumovax 23 at least 1 year apart.  Alternatively, can take Prevnar 20 without needing additional dose) Shingrix (after age 50): 2 doses from 4 weeks to 6 months apart  Please check with your PCP to make sure you are up to date.    If you test POSITIVE for COVID19 and have MILD to MODERATE symptoms: First, call your PCP if you would like to receive COVID19 treatment AND Hold your medications during the infection and for at least 1 week after your symptoms have resolved: Injectable medication (Benlysta, Cimzia, Cosentyx, Enbrel, Humira, Orencia, Remicade, Simponi, Stelara, Taltz, Tremfya) Methotrexate Leflunomide (Arava) Azathioprine Mycophenolate (Cellcept) Xeljanz, Olumiant, or Rinvoq Otezla If you take Actemra or Kevzara, you DO NOT need to hold these for COVID19 infection.  If you test POSITIVE for COVID19 and have NO symptoms: First, call your PCP if you would like to receive COVID19 treatment AND Hold your medications for at least 10 days after the day that you tested positive Injectable medication (Benlysta, Cimzia, Cosentyx, Enbrel, Humira, Orencia, Remicade, Simponi, Stelara, Taltz, Tremfya) Methotrexate Leflunomide (Arava) Azathioprine Mycophenolate (Cellcept) Xeljanz, Olumiant, or Rinvoq Otezla If you take Actemra or Kevzara, you DO NOT need to hold these for COVID19 infection.  If you have signs or symptoms of an infection or start antibiotics: First, call your PCP for workup of your infection. Hold your medication through the infection, until you complete your antibiotics, and until symptoms resolve if you take the following: Injectable medication (Actemra, Benlysta, Cimzia, Cosentyx, Enbrel, Humira, Kevzara, Orencia, Remicade, Simponi, Stelara, Taltz, Tremfya) Methotrexate Leflunomide (Arava) Mycophenolate (Cellcept) Xeljanz, Olumiant, or Rinvoq  Heart Disease Prevention   Your inflammatory disease increases your risk of heart disease which includes heart attack,  stroke, atrial   fibrillation (irregular heartbeats), high blood pressure, heart failure and atherosclerosis (plaque in the arteries).  It is important to reduce your risk by:   Keep blood pressure, cholesterol, and blood sugar at healthy levels   Smoking Cessation   Maintain a healthy weight  BMI 20-25   Eat a healthy diet  Plenty of fresh fruit, vegetables, and whole grains  Limit saturated fats, foods high in sodium, and added sugars  DASH and Mediterranean diet   Increase physical activity  Recommend moderate physically activity for 150 minutes per week/ 30 minutes a day for five days a week These can be broken up into three separate ten-minute sessions during the day.   Reduce Stress  Meditation, slow breathing exercises, yoga, coloring books  Dental visits twice a year      

## 2020-12-06 ENCOUNTER — Telehealth: Payer: Self-pay

## 2020-12-06 NOTE — Telephone Encounter (Signed)
Monica [name] from [facility] Short Stay called stating they don't have patient's orders for her Orencia infusion which is scheduled for tomorrow, 12/07/20 at 9:30 am.   Phone #[phone]

## 2020-12-06 NOTE — Telephone Encounter (Signed)
Chesley Mires, Ilda Basset D, placed orders on 11/05/2020 for 3 doses but all doses were discontinued when the last order was released per Thyra Breed at [facility] medical day. Thyra Breed is faxing a paper order over now and that standing order will be valid for 1 year so orders do not have to be placed each month for one infusion. Orders will  need to be placed in Epic tomorrow morning or the form will need to be completed.  Patient is scheduled for tomorrow morning at 9:30.

## 2020-12-06 NOTE — Telephone Encounter (Signed)
From was not received. Please place orders for tomorrows infusion. Thanks!

## 2020-12-06 NOTE — Telephone Encounter (Addendum)
Routing to Clinical Pool.

## 2020-12-07 ENCOUNTER — Other Ambulatory Visit: Payer: Self-pay

## 2020-12-07 ENCOUNTER — Encounter (HOSPITAL_COMMUNITY)
Admission: RE | Admit: 2020-12-07 | Discharge: 2020-12-07 | Disposition: A | Discharge status: Home / Self Care | Payer: Medicare Other | Source: Ambulatory Visit | Attending: Rheumatology | Admitting: Rheumatology

## 2020-12-07 ENCOUNTER — Other Ambulatory Visit: Payer: Self-pay | Admitting: Physician Assistant

## 2020-12-07 DIAGNOSIS — M0579 Rheumatoid arthritis with rheumatoid factor of multiple sites without organ or systems involvement: Secondary | ICD-10-CM | POA: Insufficient documentation

## 2020-12-07 MED ORDER — SODIUM CHLORIDE 0.9 % IV SOLN: Freq: Once | INTRAVENOUS | Status: AC

## 2020-12-07 MED ORDER — SODIUM CHLORIDE 0.9 % IV SOLN
1000.0000 mg | Freq: Once | INTRAVENOUS | Status: AC
  Administered 2020-12-07: 1000 mg via INTRAVENOUS
  Filled 2020-12-07: qty 40

## 2020-12-07 NOTE — Progress Notes (Signed)
Next infusion scheduled for Orencia on 12/07/20 and due for updated orders. Diagnosis: Rheumatoid arthritis   Dose: Orencia 1000 mg every 28 days   Last Clinic Visit: 12/01/20  Next Clinic Visit: 05/04/21  Last infusion: 11/05/20 Labs: CBC and CMP WNL 11/09/20 TB Gold: Negative on 06/16/20   Orders placed for Orencia x1 dose along with premedication of Tylenol and Benadryl to be administered 30 minutes before medication infusion.  Standing CBC with diff/platelet and CMP with GFR orders placed to be drawn every 2 months.    Sherron Ales, PA-C

## 2020-12-08 NOTE — Telephone Encounter (Signed)
Devki remains out of office until 8/4, routing back to clinical pool for follow up.

## 2020-12-08 NOTE — Telephone Encounter (Signed)
Infusion order was placed on 12/07/2020 and patient received infusion. Devki may place the infusion order once she returns to office for the next infusion which is scheduled for 01/04/2021.

## 2020-12-08 NOTE — Telephone Encounter (Signed)
Monica [name] has not received orders for patient's [name]. Patient scheduled for the end of month. Please send orders.

## 2020-12-09 NOTE — Telephone Encounter (Signed)
FYI

## 2020-12-29 DIAGNOSIS — M069 Rheumatoid arthritis, unspecified: Secondary | ICD-10-CM | POA: Diagnosis not present

## 2020-12-29 DIAGNOSIS — J45909 Unspecified asthma, uncomplicated: Secondary | ICD-10-CM | POA: Diagnosis not present

## 2020-12-29 DIAGNOSIS — M159 Polyosteoarthritis, unspecified: Secondary | ICD-10-CM | POA: Diagnosis not present

## 2020-12-29 DIAGNOSIS — J4530 Mild persistent asthma, uncomplicated: Secondary | ICD-10-CM | POA: Diagnosis not present

## 2020-12-29 DIAGNOSIS — F339 Major depressive disorder, recurrent, unspecified: Secondary | ICD-10-CM | POA: Diagnosis not present

## 2020-12-29 DIAGNOSIS — I10 Essential (primary) hypertension: Secondary | ICD-10-CM | POA: Diagnosis not present

## 2020-12-29 DIAGNOSIS — E7800 Pure hypercholesterolemia, unspecified: Secondary | ICD-10-CM | POA: Diagnosis not present

## 2020-12-29 DIAGNOSIS — M8580 Other specified disorders of bone density and structure, unspecified site: Secondary | ICD-10-CM | POA: Diagnosis not present

## 2021-01-03 ENCOUNTER — Other Ambulatory Visit: Payer: Self-pay | Admitting: Physician Assistant

## 2021-01-03 DIAGNOSIS — M0579 Rheumatoid arthritis with rheumatoid factor of multiple sites without organ or systems involvement: Secondary | ICD-10-CM

## 2021-01-03 NOTE — Telephone Encounter (Signed)
Next Visit: 05/04/2021  Last Visit: 12/01/2020  Labs: 11/09/2020 CBC and CMP WNL  Eye exam: 11/23/2020 WNL   Current Dose per office note 12/01/2020: Plaquenil 200 mg in the morning and 100 mg at bedtime.  VX:[id number] arthritis with rheumatoid factor of multiple sites without organ or systems involvement   Last Fill: 09/28/2020   Okay to refill Plaquenil?

## 2021-01-04 ENCOUNTER — Other Ambulatory Visit: Payer: Self-pay

## 2021-01-04 ENCOUNTER — Encounter (HOSPITAL_COMMUNITY)
Admission: RE | Admit: 2021-01-04 | Discharge: 2021-01-04 | Disposition: A | Discharge status: Home / Self Care | Payer: Medicare Other | Source: Ambulatory Visit | Attending: Rheumatology | Admitting: Rheumatology

## 2021-01-04 DIAGNOSIS — M0579 Rheumatoid arthritis with rheumatoid factor of multiple sites without organ or systems involvement: Secondary | ICD-10-CM

## 2021-01-04 LAB — CBC WITH DIFFERENTIAL/PLATELET
Abs Immature Granulocytes: 0.02 10*3/uL (ref 0.00–0.07)
Basophils Absolute: 0.1 10*3/uL (ref 0.0–0.1)
Basophils Relative: 1 %
Eosinophils Absolute: 0.2 10*3/uL (ref 0.0–0.5)
Eosinophils Relative: 3 %
HCT: 39.3 % (ref 36.0–46.0)
Hemoglobin: 13.2 g/dL (ref 12.0–15.0)
Immature Granulocytes: 0 %
Lymphocytes Relative: 21 %
Lymphs Abs: 1.6 10*3/uL (ref 0.7–4.0)
MCH: 33.5 pg (ref 26.0–34.0)
MCHC: 33.6 g/dL (ref 30.0–36.0)
MCV: 99.7 fL (ref 80.0–100.0)
Monocytes Absolute: 0.7 10*3/uL (ref 0.1–1.0)
Monocytes Relative: 9 %
Neutro Abs: 5.2 10*3/uL (ref 1.7–7.7)
Neutrophils Relative %: 66 %
Platelets: 312 10*3/uL (ref 150–400)
RBC: 3.94 MIL/uL (ref 3.87–5.11)
RDW: 12.6 % (ref 11.5–15.5)
WBC: 7.8 10*3/uL (ref 4.0–10.5)
nRBC: 0 % (ref 0.0–0.2)

## 2021-01-04 LAB — COMPREHENSIVE METABOLIC PANEL
ALT: 10 U/L (ref 0–44)
AST: 24 U/L (ref 15–41)
Albumin: 3.9 g/dL (ref 3.5–5.0)
Alkaline Phosphatase: 64 U/L (ref 38–126)
Anion gap: 9 (ref 5–15)
BUN: 18 mg/dL (ref 8–23)
CO2: 25 mmol/L (ref 22–32)
Calcium: 8.7 mg/dL — ABNORMAL LOW (ref 8.9–10.3)
Chloride: 102 mmol/L (ref 98–111)
Creatinine, Ser: 0.81 mg/dL (ref 0.44–1.00)
GFR, Estimated: >60 mL/min (ref >60)
Glucose, Bld: 92 mg/dL (ref 70–99)
Potassium: 4 mmol/L (ref 3.5–5.1)
Sodium: 136 mmol/L (ref 135–145)
Total Bilirubin: 1 mg/dL (ref 0.3–1.2)
Total Protein: 7 g/dL (ref 6.5–8.1)

## 2021-01-04 MED ORDER — SODIUM CHLORIDE 0.9 % IV SOLN
INTRAVENOUS | Status: DC
  Administered 2021-01-04: 250 mL via INTRAVENOUS

## 2021-01-04 MED ORDER — DIPHENHYDRAMINE HCL 25 MG PO CAPS: 25.0000 mg | ORAL_CAPSULE | Freq: Once | ORAL | Status: DC

## 2021-01-04 MED ORDER — DIPHENHYDRAMINE HCL 25 MG PO CAPS
ORAL_CAPSULE | ORAL | Status: AC
  Filled 2021-01-04: qty 1

## 2021-01-04 MED ORDER — SODIUM CHLORIDE 0.9 % IV SOLN
1000.0000 mg | INTRAVENOUS | Status: DC
  Administered 2021-01-04: 1000 mg via INTRAVENOUS
  Filled 2021-01-04: qty 40

## 2021-01-04 MED ORDER — ACETAMINOPHEN 325 MG PO TABS
650.0000 mg | ORAL_TABLET | Freq: Once | ORAL | Status: DC
Start: 2021-01-04 — End: 2021-01-05

## 2021-01-04 MED ORDER — ACETAMINOPHEN 325 MG PO TABS
ORAL_TABLET | ORAL | Status: AC
  Filled 2021-01-04: qty 2

## 2021-01-04 NOTE — Progress Notes (Signed)
Calcium is slightly low-8.7.  Please clarify if she takes a calcium supplement.  It appears that she is taking vitamin D 2000 units daily.  Please advise the patient to increased dietary calcium and to continue taking a vitamin D supplement daily.   Rest of CMP WNL.  CBC WNL.

## 2021-01-18 ENCOUNTER — Other Ambulatory Visit: Payer: Self-pay | Admitting: Pharmacist

## 2021-01-18 DIAGNOSIS — M0579 Rheumatoid arthritis with rheumatoid factor of multiple sites without organ or systems involvement: Secondary | ICD-10-CM

## 2021-01-18 DIAGNOSIS — Z79899 Other long term (current) drug therapy: Secondary | ICD-10-CM

## 2021-01-18 NOTE — Progress Notes (Signed)
Next infusion scheduled for Orencia IV on 02/01/21 and due for updated orders. Patient receives infusions at [facility] Diagnosis: rheumatoid arthritis  Dose: 1000 mg every 28 days (appropriate based on last recorded weight of 100.7kg)  Last Clinic Visit: 12/01/20 Next Clinic Visit: 05/04/21  Last infusion: 01/04/21  Labs: CBC and CMP on 01/04/21 TB Gold: negative on 06/16/20   Orders placed for Orencia IV x 3 doses along with premedication of acetaminophen and diphenhydramine to be administered 30 minutes before medication infusion.  Standing CBC with diff/platelet and CMP with GFR orders placed to be drawn every 2 months.  Next TB gold due 06/16/21  Chesley Mires, PharmD, MPH, BCPS Clinical Pharmacist (Rheumatology and Pulmonology)

## 2021-01-18 NOTE — Telephone Encounter (Signed)
Orencia orders renewed for [name] Short Stay center today.  Will close encounter  Chesley Mires, PharmD, MPH, BCPS Clinical Pharmacist (Rheumatology and Pulmonology)

## 2021-02-01 ENCOUNTER — Other Ambulatory Visit: Payer: Self-pay

## 2021-02-01 ENCOUNTER — Encounter (HOSPITAL_COMMUNITY)
Admission: RE | Admit: 2021-02-01 | Discharge: 2021-02-01 | Disposition: A | Discharge status: Home / Self Care | Payer: Medicare Other | Source: Ambulatory Visit | Attending: Rheumatology | Admitting: Rheumatology

## 2021-02-01 DIAGNOSIS — M0579 Rheumatoid arthritis with rheumatoid factor of multiple sites without organ or systems involvement: Secondary | ICD-10-CM | POA: Diagnosis not present

## 2021-02-01 MED ORDER — ACETAMINOPHEN 325 MG PO TABS: 650.0000 mg | ORAL_TABLET | ORAL | Status: DC

## 2021-02-01 MED ORDER — SODIUM CHLORIDE 0.9 % IV SOLN
1000.0000 mg | INTRAVENOUS | Status: DC
  Administered 2021-02-01: 1000 mg via INTRAVENOUS
  Filled 2021-02-01: qty 40

## 2021-02-01 MED ORDER — SODIUM CHLORIDE 0.9 % IV SOLN
INTRAVENOUS | Status: DC
  Administered 2021-02-01: 250 mL via INTRAVENOUS

## 2021-02-01 MED ORDER — DIPHENHYDRAMINE HCL 25 MG PO CAPS: 25.0000 mg | ORAL_CAPSULE | ORAL | Status: DC

## 2021-02-16 NOTE — Progress Notes (Signed)
HPI female never smoker followed for asthma, allergic rhinitis, complicated by rheumatoid arthritis PFT 02/02/09-mild obstruction small airways, minimal response bronchodilator, DLCO mildly reduced. FVC 3.36/116%, FEV1 2.48/119%, ratio 0.74, FEF 25-75% 1.80/75%, TLC 108%, DLCO 70% Office Spirometry 09/08/16-WNL-FVC 2.63/98%, FEV1 1.91/95%, ratio 0.73, FEF 25-75% 1.32/82% FENO 02/12/17- 32H -------------------------------------------------------------------------------------   02/18/20-  80 year old female never smoker followed for Asthmatic Bronchitis, chronic cough, Allergic Rhinitis, complicated by Rheumatoid Arthritis, Osteoarthritis, Depression, HTN,  Trelegy, Ventolin hfa       Orencia and Plaquenil for RA Covid vax- 2 Moderna Flu vax- today senior Doing well with no acute events. Using trescue inhaler </= 1x/ month. Trelegy daily working well.   02/17/21- 80 year old female never smoker followed for Asthmatic Bronchitis, chronic cough, Allergic Rhinitis, complicated by Rheumatoid Arthritis, Osteoarthritis, Depression, HTN,  -Trelegy, Ventolin hfa       Orencia and Plaquenil for RA Covid vax- 3 Moderna Flu vax- today senior -----Pt states no concerns Rare need for rescue inhaler-mostly in highly humid weather.  Has not had COVID infection. Bothersome postnasal drip. CXR 02/20/20- IMPRESSION: Lungs clear.  Stable cardiac silhouette.   ROS-see HPI    + = positive Constitutional:   No-   weight loss, night sweats, fevers, chills, fatigue, lassitude. HEENT:   No-  headaches, difficulty swallowing, tooth/dental problems, sore throat,       No-  sneezing, itching, ear ache, nasal congestion, +post nasal drip,  CV:  No-   chest pain, orthopnea, PND, +swelling in lower extremities, anasarca, dizziness, palpitations Resp:   + shortness of breath with exertion or at rest.              No-   productive cough,   non-productive cough,  No- coughing up of blood.              No-   change in color  of mucus.  +rare wheezing.   Skin: No-   rash or lesions. GI:  No-   heartburn, indigestion, abdominal pain, nausea, vomiting,  GU:  MS: + joint pain or swelling.   Neuro-     nothing unusual Psych:  No- change in mood or affect. No depression or anxiety.  No memory loss.  OBJ        General- Alert, Oriented, Affect-appropriate, Distress- none acute. + Morbidly obese Skin- rash-none, lesions- none, excoriation- none Lymphadenopathy- none Head- atraumatic            Eyes- Gross vision intact, PERRLA, conjunctivae clear secretions            Ears- Hearing, canals-normal            Nose- Clear, no-Septal dev, mucus, polyps, erosion, perforation             Throat- Mallampati III , mucosa clear , drainage- none, tonsils- atrophic. +throat clearing Neck- flexible , trachea midline, no stridor , thyroid nl, carotid no bruit Chest - symmetrical excursion , unlabored           Heart/CV- RRR , no murmur , no gallop  , no rub, nl s1 s2                           - JVD- none , edema- none, stasis changes- none, varices- none           Lung- clear, wheeze- none, cough- none , dullness-none, rub- none           Chest wall-  Abd-  Br/ Gen/ Rectal- Not done, not indicated Extrem- cyanosis- none, clubbing, none, atrophy- none, strength- nl Neuro- grossly intact to observation

## 2021-02-17 ENCOUNTER — Encounter: Payer: Self-pay | Admitting: Internal Medicine

## 2021-02-17 ENCOUNTER — Other Ambulatory Visit: Payer: Self-pay

## 2021-02-17 ENCOUNTER — Ambulatory Visit (INDEPENDENT_AMBULATORY_CARE_PROVIDER_SITE_OTHER): Payer: Medicare Other | Admitting: Internal Medicine

## 2021-02-17 VITALS — BP 134/76 | HR 73 | Temp 98.4°F | Ht 62.5 in | Wt 220.2 lb

## 2021-02-17 DIAGNOSIS — J4530 Mild persistent asthma, uncomplicated: Secondary | ICD-10-CM

## 2021-02-17 DIAGNOSIS — Z23 Encounter for immunization: Secondary | ICD-10-CM | POA: Diagnosis not present

## 2021-02-17 DIAGNOSIS — J301 Allergic rhinitis due to pollen: Secondary | ICD-10-CM | POA: Diagnosis not present

## 2021-02-17 MED ORDER — ALBUTEROL SULFATE HFA 108 (90 BASE) MCG/ACT IN AERS: INHALATION_SPRAY | RESPIRATORY_TRACT | 99 refills | Status: DC

## 2021-02-17 MED ORDER — TRELEGY ELLIPTA 100-62.5-25 MCG/INH IN AEPB: INHALATION_SPRAY | RESPIRATORY_TRACT | 11 refills | Status: DC

## 2021-02-17 NOTE — Patient Instructions (Signed)
Refills sent for Trelegy and Ventolin  Order- Flu vax seniot  Suggest you try otc Flonase (fluticasone) nasal spray   1-2 puffs each nostril every night at bedtime.  Try this at least a week or two to see if it will reduce your sense of post-nasal drip  Please call if we can help

## 2021-02-24 ENCOUNTER — Telehealth: Payer: Self-pay

## 2021-02-24 NOTE — Telephone Encounter (Signed)
Becky from [facility] called to let the office know patient is scheduled for Orencia infusion on 03/01/21 and will need orders put in the computer.   [phone]

## 2021-02-25 ENCOUNTER — Telehealth: Payer: Self-pay | Admitting: Rheumatology

## 2021-02-25 NOTE — Telephone Encounter (Signed)
Routing to Devki

## 2021-02-25 NOTE — Telephone Encounter (Signed)
[facility] hospital calling requesting for orders to be put in for patient's Orencia on Tuesday 03/08/2021.

## 2021-02-28 ENCOUNTER — Other Ambulatory Visit: Payer: Self-pay | Admitting: Pharmacist

## 2021-02-28 DIAGNOSIS — M069 Rheumatoid arthritis, unspecified: Secondary | ICD-10-CM | POA: Diagnosis not present

## 2021-02-28 DIAGNOSIS — F339 Major depressive disorder, recurrent, unspecified: Secondary | ICD-10-CM | POA: Diagnosis not present

## 2021-02-28 DIAGNOSIS — M8580 Other specified disorders of bone density and structure, unspecified site: Secondary | ICD-10-CM | POA: Diagnosis not present

## 2021-02-28 DIAGNOSIS — E7800 Pure hypercholesterolemia, unspecified: Secondary | ICD-10-CM | POA: Diagnosis not present

## 2021-02-28 DIAGNOSIS — I10 Essential (primary) hypertension: Secondary | ICD-10-CM | POA: Diagnosis not present

## 2021-02-28 DIAGNOSIS — Z79899 Other long term (current) drug therapy: Secondary | ICD-10-CM

## 2021-02-28 DIAGNOSIS — M0579 Rheumatoid arthritis with rheumatoid factor of multiple sites without organ or systems involvement: Secondary | ICD-10-CM

## 2021-02-28 DIAGNOSIS — J45909 Unspecified asthma, uncomplicated: Secondary | ICD-10-CM | POA: Diagnosis not present

## 2021-02-28 DIAGNOSIS — J4530 Mild persistent asthma, uncomplicated: Secondary | ICD-10-CM | POA: Diagnosis not present

## 2021-02-28 DIAGNOSIS — F3341 Major depressive disorder, recurrent, in partial remission: Secondary | ICD-10-CM | POA: Diagnosis not present

## 2021-02-28 DIAGNOSIS — M159 Polyosteoarthritis, unspecified: Secondary | ICD-10-CM | POA: Diagnosis not present

## 2021-02-28 NOTE — Telephone Encounter (Signed)
Orencia infusion orders placed today x 3 doses for Effie. Next infusion is scheduled for tomorrow, 02/28/21.  Kenyon Eshleman, PharmD, MPH, BCPS Clinical Pharmacist (Rheumatology and Pulmonology) 

## 2021-02-28 NOTE — Progress Notes (Signed)
Next infusion scheduled for Orencia IV on 03/01/21 and due for updated orders. Patient receives infusions at [facility] Diagnosis: rheumatoid arthritis  Dose: 1000 mg every 28 days (appropriate based on last recorded weight of 99.9 kg)  Last Clinic Visit: 12/01/20 Next Clinic Visit: 05/04/21  Last infusion: 02/01/21  Labs: CBC and CMP on 01/04/21 wnl TB Gold: negative on 06/16/20   Orders placed for Orencia IV x 3 doses along with premedication of acetaminophen and diphenhydramine to be administered 30 minutes before medication infusion.  Standing CBC with diff/platelet and CMP with GFR orders placed to be drawn every 2 months.  Next TB gold due 06/16/21  Chesley Mires, PharmD, MPH, BCPS Clinical Pharmacist (Rheumatology and Pulmonology)

## 2021-02-28 NOTE — Telephone Encounter (Signed)
Orencia infusion orders placed today x 3 doses for [facility]. Next infusion is scheduled for tomorrow, 02/28/21.  Chesley Mires, PharmD, MPH, BCPS Clinical Pharmacist (Rheumatology and Pulmonology)

## 2021-03-01 ENCOUNTER — Encounter (HOSPITAL_COMMUNITY)
Admission: RE | Admit: 2021-03-01 | Discharge: 2021-03-01 | Disposition: A | Discharge status: Home / Self Care | Payer: Medicare Other | Source: Ambulatory Visit | Attending: Rheumatology | Admitting: Rheumatology

## 2021-03-01 DIAGNOSIS — Z01812 Encounter for preprocedural laboratory examination: Secondary | ICD-10-CM | POA: Diagnosis not present

## 2021-03-01 DIAGNOSIS — M0579 Rheumatoid arthritis with rheumatoid factor of multiple sites without organ or systems involvement: Secondary | ICD-10-CM

## 2021-03-01 DIAGNOSIS — Z79899 Other long term (current) drug therapy: Secondary | ICD-10-CM | POA: Insufficient documentation

## 2021-03-01 LAB — CBC WITH DIFFERENTIAL/PLATELET
Abs Immature Granulocytes: 0.01 10*3/uL (ref 0.00–0.07)
Basophils Absolute: 0.1 10*3/uL (ref 0.0–0.1)
Basophils Relative: 1 %
Eosinophils Absolute: 0.2 10*3/uL (ref 0.0–0.5)
Eosinophils Relative: 3 %
HCT: 38.1 % (ref 36.0–46.0)
Hemoglobin: 12.6 g/dL (ref 12.0–15.0)
Immature Granulocytes: 0 %
Lymphocytes Relative: 32 %
Lymphs Abs: 2.1 10*3/uL (ref 0.7–4.0)
MCH: 32.6 pg (ref 26.0–34.0)
MCHC: 33.1 g/dL (ref 30.0–36.0)
MCV: 98.7 fL (ref 80.0–100.0)
Monocytes Absolute: 0.6 10*3/uL (ref 0.1–1.0)
Monocytes Relative: 9 %
Neutro Abs: 3.7 10*3/uL (ref 1.7–7.7)
Neutrophils Relative %: 55 %
Platelets: 307 10*3/uL (ref 150–400)
RBC: 3.86 MIL/uL — ABNORMAL LOW (ref 3.87–5.11)
RDW: 12.4 % (ref 11.5–15.5)
WBC: 6.7 10*3/uL (ref 4.0–10.5)
nRBC: 0 % (ref 0.0–0.2)

## 2021-03-01 LAB — COMPREHENSIVE METABOLIC PANEL
ALT: 7 U/L (ref 0–44)
AST: 16 U/L (ref 15–41)
Albumin: 3.7 g/dL (ref 3.5–5.0)
Alkaline Phosphatase: 66 U/L (ref 38–126)
Anion gap: 7 (ref 5–15)
BUN: 20 mg/dL (ref 8–23)
CO2: 28 mmol/L (ref 22–32)
Calcium: 8.5 mg/dL — ABNORMAL LOW (ref 8.9–10.3)
Chloride: 102 mmol/L (ref 98–111)
Creatinine, Ser: 0.65 mg/dL (ref 0.44–1.00)
GFR, Estimated: >60 mL/min (ref >60)
Glucose, Bld: 93 mg/dL (ref 70–99)
Potassium: 3.6 mmol/L (ref 3.5–5.1)
Sodium: 137 mmol/L (ref 135–145)
Total Bilirubin: 0.8 mg/dL (ref 0.3–1.2)
Total Protein: 6.6 g/dL (ref 6.5–8.1)

## 2021-03-01 MED ORDER — SODIUM CHLORIDE 0.9 % IV SOLN
1000.0000 mg | INTRAVENOUS | Status: DC
  Administered 2021-03-01: 1000 mg via INTRAVENOUS
  Filled 2021-03-01: qty 40

## 2021-03-01 MED ORDER — ACETAMINOPHEN 325 MG PO TABS: 650.0000 mg | ORAL_TABLET | ORAL | Status: DC

## 2021-03-01 MED ORDER — SODIUM CHLORIDE 0.9 % IV SOLN: INTRAVENOUS | Status: DC

## 2021-03-01 MED ORDER — DIPHENHYDRAMINE HCL 25 MG PO CAPS: 25.0000 mg | ORAL_CAPSULE | ORAL | Status: DC

## 2021-03-01 NOTE — Progress Notes (Signed)
Calcium remains low and continues to trend down.  Please clarify if she is taking a calcium or vitamin D supplement.  Rest of CMP WNL.

## 2021-03-01 NOTE — Progress Notes (Signed)
RBC count is borderline low-3.86.  3.87 is normal so there is nothing to be concerned about at this time.  Hgb and hct are WNL.  We will continue to monitor.

## 2021-03-24 ENCOUNTER — Telehealth: Payer: Self-pay

## 2021-03-24 NOTE — Telephone Encounter (Signed)
Kim from [facility] called requesting the orders for patient's Orencia infusion which is scheduled for 03/29/21.

## 2021-03-25 ENCOUNTER — Other Ambulatory Visit: Payer: Self-pay | Admitting: Pharmacist

## 2021-03-25 DIAGNOSIS — M0579 Rheumatoid arthritis with rheumatoid factor of multiple sites without organ or systems involvement: Secondary | ICD-10-CM

## 2021-03-25 DIAGNOSIS — Z79899 Other long term (current) drug therapy: Secondary | ICD-10-CM

## 2021-03-25 NOTE — Telephone Encounter (Signed)
Orencia orders placed for [facility] infusion scheduled for 03/29/21. Nothing further needed.

## 2021-03-25 NOTE — Progress Notes (Signed)
Next infusion scheduled for Orencia IV on 03/29/21 and due for updated orders. Patient receives infusions at [facility] Diagnosis: rheumatoid arthritis  Dose: 1000 mg every 28 days (appropriate based on last recorded weight of 99.9 kg)  Last Clinic Visit: 12/01/20 Next Clinic Visit: 05/04/21  Last infusion: 03/01/21  Labs: CBC and CMP on 01/04/21 wnl TB Gold: negative on 06/16/20   Orders placed for Orencia IV x 1 dose along with premedication of acetaminophen and diphenhydramine to be administered 30 minutes before medication infusion.  Standing CBC with diff/platelet and CMP with GFR orders placed to be drawn every 2 months.  Next TB gold due 06/16/21  Chesley Mires, PharmD, MPH, BCPS Clinical Pharmacist (Rheumatology and Pulmonology)

## 2021-03-29 ENCOUNTER — Encounter (HOSPITAL_COMMUNITY): Payer: Self-pay

## 2021-03-29 ENCOUNTER — Encounter (HOSPITAL_COMMUNITY)
Admission: RE | Admit: 2021-03-29 | Discharge: 2021-03-29 | Disposition: A | Discharge status: Home / Self Care | Payer: Medicare Other | Source: Ambulatory Visit | Attending: Rheumatology | Admitting: Rheumatology

## 2021-03-29 DIAGNOSIS — M0579 Rheumatoid arthritis with rheumatoid factor of multiple sites without organ or systems involvement: Secondary | ICD-10-CM | POA: Insufficient documentation

## 2021-03-29 DIAGNOSIS — Z79899 Other long term (current) drug therapy: Secondary | ICD-10-CM | POA: Insufficient documentation

## 2021-03-29 MED ORDER — SODIUM CHLORIDE 0.9 % IV SOLN
1000.0000 mg | INTRAVENOUS | Status: AC
  Administered 2021-03-29: 1000 mg via INTRAVENOUS
  Filled 2021-03-29: qty 40

## 2021-03-29 MED ORDER — SODIUM CHLORIDE 0.9 % IV SOLN: Freq: Once | INTRAVENOUS | Status: AC

## 2021-03-29 MED ORDER — ACETAMINOPHEN 325 MG PO TABS: 650.0000 mg | ORAL_TABLET | Freq: Once | ORAL | Status: DC

## 2021-03-29 MED ORDER — DIPHENHYDRAMINE HCL 25 MG PO CAPS: 25.0000 mg | ORAL_CAPSULE | Freq: Once | ORAL | Status: DC

## 2021-04-02 ENCOUNTER — Other Ambulatory Visit: Payer: Self-pay | Admitting: Physician Assistant

## 2021-04-02 DIAGNOSIS — M0579 Rheumatoid arthritis with rheumatoid factor of multiple sites without organ or systems involvement: Secondary | ICD-10-CM

## 2021-04-04 NOTE — Telephone Encounter (Signed)
Next Visit: 05/04/2021   Last Visit: 12/01/2020   Labs: 03/01/2021 RBC count is borderline low-3.86.  3.87 is normal so there is nothing to be concerned about at this time.  Hgb and hct are WNL. Calcium remains low and continues to trend down.  Rest of CMP WNL.     Eye exam: 11/23/2020 WNL    Current Dose per office note 12/01/2020: Plaquenil 200 mg in the morning and 100 mg at bedtime.   EB:[id number] arthritis with rheumatoid factor of multiple sites without organ or systems involvement    Last Fill: 01/03/2021    Okay to refill Plaquenil?

## 2021-04-20 NOTE — Progress Notes (Signed)
Office Visit Note  Patient: Monica [name]             Date of Birth: Apr 20, 1941           MRN: [id number]             PCP: Aretta Nip, MD Referring: Aretta Nip, MD Visit Date: 05/04/2021 Occupation: @GUAROCC @  Subjective:  Medication monitoring   History of Present Illness: Monica [name] is a 80 y.o. female with history of seropositive rheumatoid arthritis and osteoarthritis.  She is on IV Orencia 1000 mg infusions every 28 days and plaquenil 200 mg 1 tablet in the morning and half tablet in the evening. Her last infusion was on 04/26/21.  She has not missed any doses of Orencia or Plaquenil recently.  She continues to tolerate both medications without any side effects.  She denies any signs or symptoms of a rheumatoid arthritis flare.  She denies any breakthrough pain leading up to her Orencia infusions.  She continues to have chronic lower back pain as well as right knee joint pain.  She has been using a cane to assist with ambulation.  She denies any obvious joint swelling. She denies any recent infections.   Activities of Daily Living:  Patient reports morning stiffness for 1.5-2 hours.   Patient Denies nocturnal pain.  Difficulty dressing/grooming: Reports Difficulty climbing stairs: Reports Difficulty getting out of chair: Reports Difficulty using hands for taps, buttons, cutlery, and/or writing: Denies  Review of Systems  Constitutional:  Positive for fatigue.  HENT:  Negative for mouth sores, mouth dryness and nose dryness.   Eyes:  Positive for dryness. Negative for pain and visual disturbance.  Respiratory:  Positive for shortness of breath. Negative for cough, hemoptysis and difficulty breathing.   Cardiovascular:  Negative for chest pain, palpitations, hypertension and swelling in legs/feet.  Gastrointestinal:  Negative for blood in stool, constipation and diarrhea.  Endocrine: Negative for increased urination.  Genitourinary:  Negative for  difficulty urinating and painful urination.  Musculoskeletal:  Positive for joint pain, joint pain and morning stiffness. Negative for joint swelling, myalgias, muscle weakness, muscle tenderness and myalgias.  Skin:  Negative for color change, pallor, rash, hair loss, nodules/bumps, skin tightness, ulcers and sensitivity to sunlight.  Allergic/Immunologic: Negative for susceptible to infections.  Neurological:  Positive for weakness. Negative for dizziness, numbness and headaches.  Hematological:  Negative for swollen glands.  Psychiatric/Behavioral:  Positive for sleep disturbance. Negative for depressed mood. The patient is not nervous/anxious.    PMFS History:  Patient Active Problem List   Diagnosis Date Noted   Asthmatic bronchitis 02/16/2019   Osteopenia 12/20/2016   History of depression 12/11/2016   High risk medication use 05/15/2016   Primary osteoarthritis of both hands 05/15/2016   Primary osteoarthritis of both feet 05/15/2016   Primary osteoarthritis of both knees 05/15/2016   Vitamin D deficiency 05/15/2016   History of hypertension 05/15/2016   History of hyperlipidemia 05/15/2016   Rheumatoid arthritis with rheumatoid factor of multiple sites without organ or systems involvement ([place]) 01/10/2016   Seasonal allergic rhinitis 10/03/2011   HYPERLIPIDEMIA 12/31/2008   HYPERTENSION 12/31/2008   Mild persistent asthmatic bronchitis with exacerbation 12/31/2008    Past Medical History:  Diagnosis Date   Arthritis    Asthma    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, FEV1 119%   Hyperlipidemia    Hypertension     Family History  Problem Relation Age of Onset   Lung cancer Father  Alzheimer's disease Mother    Past Surgical History:  Procedure Laterality Date   CARPAL TUNNEL RELEASE     FOOT SURGERY     MOUTH SURGERY Right 05/13/2020   Social History   Social History Narrative   Not on file   Immunization History  Administered Date(s) Administered   Fluad  Quad(high Dose 65+) 02/13/2019, 02/18/2020, 02/17/2021   Influenza Nasal 02/29/2012   Influenza Split 03/02/2011   Influenza, High Dose Seasonal PF 02/12/2017, 02/12/2018   Influenza,inj,Quad PF,6+ Mos 02/25/2013, 02/23/2014   Influenza-Unspecified 03/15/2015   Moderna Sars-Covid-2 Vaccination 07/16/2019, 08/13/2019, 08/06/2020   Pneumococcal Conjugate-13 02/25/2013     Objective: Vital Signs: BP 134/89 (BP Location: Left Arm, Patient Position: Sitting, Cuff Size: Normal)    Pulse 76    Ht 5' 2.5" (1.588 m)    Wt 221 lb 9.6 oz (100.5 kg)    BMI 39.89 kg/m    Physical Exam Vitals and nursing note reviewed.  Constitutional:      Appearance: She is well-developed.  HENT:     Head: Normocephalic and atraumatic.  Eyes:     Conjunctiva/sclera: Conjunctivae normal.  Pulmonary:     Effort: Pulmonary effort is normal.  Abdominal:     Palpations: Abdomen is soft.  Musculoskeletal:     Cervical back: Normal range of motion.  Skin:    General: Skin is warm and dry.     Capillary Refill: Capillary refill takes less than 2 seconds.  Neurological:     Mental Status: She is alert and oriented to person, place, and time.  Psychiatric:        Behavior: Behavior normal.     Musculoskeletal Exam: C-spine has good range of motion with no discomfort.  Mild postural thoracic kyphosis noted.  No midline spinal tenderness noted.  Shoulder joints, elbow joints, wrist joints, MCPs, PIPs, DIPs have good range of motion with no synovitis.  PIP and DIP thickening consistent with osteoarthritis of both hands.  Hip joints have good range of motion with no groin pain.  Some discomfort with range of motion of the right knee joint.  No warmth or effusion of knee joints noted.  Ankle joints have good range of motion with no tenderness or joint swelling.  CDAI Exam: CDAI Score: 0.6  Patient Global: 3 mm; Provider Global: 3 mm Swollen: 0 ; Tender: 0  Joint Exam 05/04/2021   No joint exam has been documented  for this visit   There is currently no information documented on the homunculus. Go to the Rheumatology activity and complete the homunculus joint exam.  Investigation: No additional findings.  Imaging: No results found.  Recent Labs: Lab Results  Component Value Date   WBC 5.4 04/26/2021   HGB 13.4 04/26/2021   PLT 325 04/26/2021   NA 137 04/26/2021   K 3.4 (L) 04/26/2021   CL 102 04/26/2021   CO2 28 04/26/2021   GLUCOSE 107 (H) 04/26/2021   BUN 20 04/26/2021   CREATININE 0.72 04/26/2021   BILITOT 0.5 04/26/2021   ALKPHOS 67 04/26/2021   AST 17 04/26/2021   ALT 11 04/26/2021   PROT 7.0 04/26/2021   ALBUMIN 3.9 04/26/2021   CALCIUM 9.2 04/26/2021   GFRAA >60 01/27/2020   QFTBGOLD Negative 07/24/2016   QFTBGOLDPLUS NEGATIVE 06/16/2020    Speciality Comments: PLQ eye exam:11/23/2020 WNL @ [facility]. Follow up in 1 year.  Orencia 1000 mg IV q 28days started on 09/2016 , last infusion 05/25/20 Prior therapy includes: Simponi Aria (  inadequate response)  Procedures:  No procedures performed Allergies: Arava [leflunomide] and Piroxicam    Assessment / Plan:     Visit Diagnoses: Rheumatoid arthritis with rheumatoid factor of multiple sites without organ or systems involvement ([place]): She has no joint tenderness or synovitis on examination today.  She has not had any signs or symptoms of a rheumatoid arthritis flare.  She is clinically doing well on combination therapy: IV Orencia 1000 mg infusions every 28 days and Plaquenil 200 mg 1 tablet in the morning and half tablet in the evening.  She is tolerating both medications without any side effects and has not had any recent infections.  She continues to experience intermittent pain and stiffness in both hands and both feet as well as her lower back and right knee joint.  She had no inflammation on examination today.  Her discomfort seems to be due to underlying osteoarthritis.  Her rheumatoid arthritis is well  controlled on the current treatment regimen.  No medication changes will be made at this time.  She was advised to notify us if she develops increased joint pain or inflammation.  She will follow-up in the office in 5 months.  High risk medication use - Orencia IV 1000 mg every 28 days and Plaquenil 200 mg in the morning and 100 mg at bedtime.  She is tolerating both medications without any side effects. TB gold negative on 06/16/20.  Future order for TB gold placed today. CBC and CMP updated on 04/26/21.  She will continue to have lab work drawn with infusions.   - Plan: QuantiFERON-TB Gold Plus She has not had any recent infections.  Discussed the importance of holding orencia infusions if she develops signs or symptoms of an infection and to resume once the infection has completely cleared.   PLQ eye exam:11/23/2020 WNL @ [facility]. Follow up in 1 year.  Screening for tuberculosis -Future order for TB gold placed today.  Plan: QuantiFERON-TB Gold Plus  Primary osteoarthritis of both hands - X-rays were consistent with osteoarthritis and rheumatoid arthritis overlap.  Erosive changes were noted in the left wrist in October 2020.  She has PIP and DIP thickening consistent with osteoarthritis of both hands.  CMC joint prominence noted bilaterally.  Discussed the importance of joint protection and muscle strengthening.  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  She continues to have chronic pain in the right knee joint.  No warmth or effusion was noted on examination today.  She continues to use a cane to assist with ambulation.  Primary osteoarthritis of both feet - X-rays were consistent with rheumatoid arthritis and osteoarthritis overlap.  She has dorsal spurs noted.  She has good range of motion of both ankle joints on examination today.  No inflammation was noted.  Discussed the importance of wearing proper fitting  shoes.  Osteopenia of multiple sites - DEXA 11/10/2020 T-score: -1.9, BMD: 0.639 right femoral neck. DEXA on 11/06/2018 showed T-score -2.0 at lumbar spine and right hip had -11% change in BMD.  She continues to take vitamin D supplement 2000 units daily.  No recent falls or fractures.  She uses a cane to assist with ambulation.  Due to update DEXA July 2024.  History of vitamin D deficiency: She is taking vitamin D 2000 units daily.  Other medical conditions are listed as follows:  History of hypertension: Blood pressure was 134/89 today in the office.  History of hyperlipidemia: She is taking  Mevacor as prescribed.   History of depression: She remains on Zoloft as prescribed.  History of asthma    Orders: Orders Placed This Encounter  Procedures   QuantiFERON-TB Gold Plus   No orders of the defined types were placed in this encounter.    Follow-Up Instructions: Return in 5 months (on 10/02/2021) for Rheumatoid arthritis.   Ofilia Neas, PA-C  Note - This record has been created using Dragon software.  Chart creation errors have been sought, but may not always  have been located. Such creation errors do not reflect on  the standard of medical care.

## 2021-04-26 ENCOUNTER — Other Ambulatory Visit: Payer: Self-pay

## 2021-04-26 ENCOUNTER — Encounter (HOSPITAL_COMMUNITY)
Admission: RE | Admit: 2021-04-26 | Discharge: 2021-04-26 | Disposition: A | Discharge status: Home / Self Care | Payer: Medicare Other | Source: Ambulatory Visit | Attending: Rheumatology | Admitting: Rheumatology

## 2021-04-26 VITALS — BP 144/86 | HR 71 | Resp 20

## 2021-04-26 DIAGNOSIS — Z79899 Other long term (current) drug therapy: Secondary | ICD-10-CM | POA: Insufficient documentation

## 2021-04-26 DIAGNOSIS — M0579 Rheumatoid arthritis with rheumatoid factor of multiple sites without organ or systems involvement: Secondary | ICD-10-CM | POA: Diagnosis not present

## 2021-04-26 DIAGNOSIS — Z5181 Encounter for therapeutic drug level monitoring: Secondary | ICD-10-CM | POA: Insufficient documentation

## 2021-04-26 LAB — CBC WITH DIFFERENTIAL/PLATELET
Abs Immature Granulocytes: 0 10*3/uL (ref 0.00–0.07)
Basophils Absolute: 0 10*3/uL (ref 0.0–0.1)
Basophils Relative: 1 %
Eosinophils Absolute: 0.2 10*3/uL (ref 0.0–0.5)
Eosinophils Relative: 4 %
HCT: 39.8 % (ref 36.0–46.0)
Hemoglobin: 13.4 g/dL (ref 12.0–15.0)
Immature Granulocytes: 0 %
Lymphocytes Relative: 34 %
Lymphs Abs: 1.9 10*3/uL (ref 0.7–4.0)
MCH: 33 pg (ref 26.0–34.0)
MCHC: 33.7 g/dL (ref 30.0–36.0)
MCV: 98 fL (ref 80.0–100.0)
Monocytes Absolute: 0.7 10*3/uL (ref 0.1–1.0)
Monocytes Relative: 13 %
Neutro Abs: 2.6 10*3/uL (ref 1.7–7.7)
Neutrophils Relative %: 48 %
Platelets: 325 10*3/uL (ref 150–400)
RBC: 4.06 MIL/uL (ref 3.87–5.11)
RDW: 12.6 % (ref 11.5–15.5)
WBC: 5.4 10*3/uL (ref 4.0–10.5)
nRBC: 0 % (ref 0.0–0.2)

## 2021-04-26 LAB — COMPREHENSIVE METABOLIC PANEL
ALT: 11 U/L (ref 0–44)
AST: 17 U/L (ref 15–41)
Albumin: 3.9 g/dL (ref 3.5–5.0)
Alkaline Phosphatase: 67 U/L (ref 38–126)
Anion gap: 7 (ref 5–15)
BUN: 20 mg/dL (ref 8–23)
CO2: 28 mmol/L (ref 22–32)
Calcium: 9.2 mg/dL (ref 8.9–10.3)
Chloride: 102 mmol/L (ref 98–111)
Creatinine, Ser: 0.72 mg/dL (ref 0.44–1.00)
GFR, Estimated: >60 mL/min (ref >60)
Glucose, Bld: 107 mg/dL — ABNORMAL HIGH (ref 70–99)
Potassium: 3.4 mmol/L — ABNORMAL LOW (ref 3.5–5.1)
Sodium: 137 mmol/L (ref 135–145)
Total Bilirubin: 0.5 mg/dL (ref 0.3–1.2)
Total Protein: 7 g/dL (ref 6.5–8.1)

## 2021-04-26 MED ORDER — SODIUM CHLORIDE 0.9 % IV SOLN
1000.0000 mg | Freq: Once | INTRAVENOUS | Status: AC
  Administered 2021-04-26: 10:00:00 1000 mg via INTRAVENOUS
  Filled 2021-04-26: qty 40

## 2021-04-26 MED ORDER — SODIUM CHLORIDE 0.9 % IV SOLN: Freq: Once | INTRAVENOUS | Status: AC

## 2021-04-26 NOTE — Progress Notes (Signed)
CBC is normal.  Potassium is low due to diuretic use.  She may have to take extra potassium.  Please forward labs to patient's PCP and advised patient to contact her PCP.

## 2021-05-04 ENCOUNTER — Encounter: Payer: Self-pay | Admitting: Physician Assistant

## 2021-05-04 ENCOUNTER — Other Ambulatory Visit: Payer: Self-pay

## 2021-05-04 ENCOUNTER — Ambulatory Visit (INDEPENDENT_AMBULATORY_CARE_PROVIDER_SITE_OTHER): Payer: Medicare Other | Admitting: Physician Assistant

## 2021-05-04 VITALS — BP 134/89 | HR 76 | Ht 62.5 in | Wt 221.6 lb

## 2021-05-04 DIAGNOSIS — M19042 Primary osteoarthritis, left hand: Secondary | ICD-10-CM | POA: Diagnosis not present

## 2021-05-04 DIAGNOSIS — Z8639 Personal history of other endocrine, nutritional and metabolic disease: Secondary | ICD-10-CM

## 2021-05-04 DIAGNOSIS — M19041 Primary osteoarthritis, right hand: Secondary | ICD-10-CM | POA: Diagnosis not present

## 2021-05-04 DIAGNOSIS — Z8709 Personal history of other diseases of the respiratory system: Secondary | ICD-10-CM | POA: Diagnosis not present

## 2021-05-04 DIAGNOSIS — Z79899 Other long term (current) drug therapy: Secondary | ICD-10-CM

## 2021-05-04 DIAGNOSIS — M19071 Primary osteoarthritis, right ankle and foot: Secondary | ICD-10-CM | POA: Diagnosis not present

## 2021-05-04 DIAGNOSIS — Z111 Encounter for screening for respiratory tuberculosis: Secondary | ICD-10-CM | POA: Diagnosis not present

## 2021-05-04 DIAGNOSIS — M19072 Primary osteoarthritis, left ankle and foot: Secondary | ICD-10-CM

## 2021-05-04 DIAGNOSIS — M170 Bilateral primary osteoarthritis of knee: Secondary | ICD-10-CM

## 2021-05-04 DIAGNOSIS — Z8679 Personal history of other diseases of the circulatory system: Secondary | ICD-10-CM | POA: Diagnosis not present

## 2021-05-04 DIAGNOSIS — M0579 Rheumatoid arthritis with rheumatoid factor of multiple sites without organ or systems involvement: Secondary | ICD-10-CM | POA: Diagnosis not present

## 2021-05-04 DIAGNOSIS — M8589 Other specified disorders of bone density and structure, multiple sites: Secondary | ICD-10-CM

## 2021-05-04 DIAGNOSIS — Z8659 Personal history of other mental and behavioral disorders: Secondary | ICD-10-CM

## 2021-05-06 ENCOUNTER — Telehealth: Payer: Self-pay | Admitting: Pharmacy Technician

## 2021-05-06 NOTE — Telephone Encounter (Addendum)
Auth Submission: ORENCIA BIV Payer:  MEDICARE/BCBS SUPP Medication & CPT/J Code(s) submitted: Remicade (Infliximab) J1745 Route of submission (phone, fax, portal): PORTAL ACTIVE BCBS MED SUPPLEMENT. No prior auth required. Follow medicare quidelines

## 2021-05-06 NOTE — Telephone Encounter (Signed)
Medicare covers 80% of the infusion and no authorization is required, and the patients BCBSNC supplement would cover the 20% of the cost that was not paid for by Medicare as long as Medicare covered the medication. The Supplement also covers the patients Medicare deductible

## 2021-05-19 ENCOUNTER — Telehealth: Payer: Self-pay | Admitting: Rheumatology

## 2021-05-19 NOTE — Telephone Encounter (Signed)
Someone from the clinic at [facility] called the office to notify Dr. Corliss Skains that the patient is scheduled for her Orencia injection on 05/24/21.

## 2021-05-24 ENCOUNTER — Other Ambulatory Visit: Payer: Self-pay

## 2021-05-24 ENCOUNTER — Other Ambulatory Visit: Payer: Self-pay | Admitting: Pharmacist

## 2021-05-24 ENCOUNTER — Telehealth: Payer: Self-pay | Admitting: Rheumatology

## 2021-05-24 ENCOUNTER — Encounter (HOSPITAL_COMMUNITY): Payer: Self-pay

## 2021-05-24 ENCOUNTER — Encounter (HOSPITAL_COMMUNITY)
Admission: RE | Admit: 2021-05-24 | Discharge: 2021-05-24 | Disposition: A | Discharge status: Home / Self Care | Payer: Medicare Other | Source: Ambulatory Visit | Attending: Rheumatology | Admitting: Rheumatology

## 2021-05-24 DIAGNOSIS — Z79899 Other long term (current) drug therapy: Secondary | ICD-10-CM

## 2021-05-24 DIAGNOSIS — M0579 Rheumatoid arthritis with rheumatoid factor of multiple sites without organ or systems involvement: Secondary | ICD-10-CM

## 2021-05-24 DIAGNOSIS — Z111 Encounter for screening for respiratory tuberculosis: Secondary | ICD-10-CM

## 2021-05-24 MED ORDER — SODIUM CHLORIDE 0.9 % IV SOLN
1000.0000 mg | INTRAVENOUS | Status: DC
  Administered 2021-05-24: 1000 mg via INTRAVENOUS
  Filled 2021-05-24: qty 40

## 2021-05-24 MED ORDER — ACETAMINOPHEN 325 MG PO TABS: 650.0000 mg | ORAL_TABLET | ORAL | Status: DC

## 2021-05-24 MED ORDER — DIPHENHYDRAMINE HCL 25 MG PO CAPS: 25.0000 mg | ORAL_CAPSULE | ORAL | Status: DC

## 2021-05-24 MED ORDER — SODIUM CHLORIDE 0.9 % IV SOLN: Freq: Once | INTRAVENOUS | Status: AC

## 2021-05-24 NOTE — Progress Notes (Signed)
Next infusion scheduled for Orencia IV on 05/24/21 and due for updated orders. Diagnosis: RA  Dose: 1000mg  every 28 days (appropriated based on last recorded weight of 100.5kg)  Last Clinic Visit: 05/04/21 Next Clinic Visit: 10/05/21  Last infusion: 04/26/21  Labs: 04/26/21 - CBC wnl, potassium low labs forwarded to PCP TB Gold: negative on 06/16/20   Orders placed for Orencia IV x 3 doses along with premedication of acetaminophen and diphenhydramine to be administered 30 minutes before medication infusion.  Standing CBC with diff/platelet and CMP with GFR orders placed to be drawn every 2 months.  Next TB gold due 06/16/20 - will place order to be drawn today  Chesley Mires, PharmD, MPH, BCPS Clinical Pharmacist (Rheumatology and Pulmonology)

## 2021-05-24 NOTE — Telephone Encounter (Signed)
Medical Day orders renewed for IV Orencia  Knox Saliva, PharmD, MPH, BCPS Clinical Pharmacist (Rheumatology and Pulmonology)

## 2021-05-24 NOTE — Telephone Encounter (Signed)
The [facility] clinic called the office stating they needed orders for the patients Orencia injection sent urgently. They state the patient has an appointment to get the injection at 10 today.

## 2021-05-24 NOTE — Telephone Encounter (Signed)
Medical Day orders renewed for IV Orencia ° °Sujey Gundry, PharmD, MPH, BCPS °Clinical Pharmacist (Rheumatology and Pulmonology) ° °

## 2021-05-30 ENCOUNTER — Encounter: Payer: Self-pay | Admitting: Internal Medicine

## 2021-05-30 NOTE — Assessment & Plan Note (Signed)
Increase postnasal drip possibly related to fall pollens Plan-add Flonase as discussed

## 2021-05-30 NOTE — Assessment & Plan Note (Signed)
Mild persistent uncomplicated, well controlled Plan-refill Trelegy and Ventolin, flu vaccine

## 2021-06-17 ENCOUNTER — Telehealth: Payer: Self-pay | Admitting: Pharmacist

## 2021-06-17 NOTE — Telephone Encounter (Signed)
Faxed Orencia infusion orders to [facility].  Fax: [phone] Phone: [phone]  Dose: Orencia 1000mg  IV every 28 days  Labs: CBC w diff and CMP w GFR to be drawn every 2 months. Quantiferon TB gold to be drawn every 12 months.  Pre-meds: acetaminophen 650mg  orally 30 minutes before infusion and diphenhydramine 25mg  orally 30 minutes before infusion

## 2021-06-21 ENCOUNTER — Encounter (HOSPITAL_COMMUNITY)
Admission: RE | Admit: 2021-06-21 | Discharge: 2021-06-21 | Disposition: A | Discharge status: Home / Self Care | Payer: Medicare Other | Source: Ambulatory Visit | Attending: Rheumatology | Admitting: Rheumatology

## 2021-06-21 DIAGNOSIS — Z79899 Other long term (current) drug therapy: Secondary | ICD-10-CM | POA: Diagnosis not present

## 2021-06-21 DIAGNOSIS — M0579 Rheumatoid arthritis with rheumatoid factor of multiple sites without organ or systems involvement: Secondary | ICD-10-CM | POA: Diagnosis not present

## 2021-06-21 DIAGNOSIS — Z5181 Encounter for therapeutic drug level monitoring: Secondary | ICD-10-CM | POA: Insufficient documentation

## 2021-06-21 LAB — COMPREHENSIVE METABOLIC PANEL
ALT: 12 U/L (ref 0–44)
AST: 17 U/L (ref 15–41)
Albumin: 3.9 g/dL (ref 3.5–5.0)
Alkaline Phosphatase: 70 U/L (ref 38–126)
Anion gap: 12 (ref 5–15)
BUN: 17 mg/dL (ref 8–23)
CO2: 24 mmol/L (ref 22–32)
Calcium: 9 mg/dL (ref 8.9–10.3)
Chloride: 103 mmol/L (ref 98–111)
Creatinine, Ser: 0.77 mg/dL (ref 0.44–1.00)
GFR, Estimated: >60 mL/min (ref >60)
Glucose, Bld: 96 mg/dL (ref 70–99)
Potassium: 3.7 mmol/L (ref 3.5–5.1)
Sodium: 139 mmol/L (ref 135–145)
Total Bilirubin: 0.5 mg/dL (ref 0.3–1.2)
Total Protein: 6.9 g/dL (ref 6.5–8.1)

## 2021-06-21 LAB — CBC WITH DIFFERENTIAL/PLATELET
Abs Immature Granulocytes: 0.01 10*3/uL (ref 0.00–0.07)
Basophils Absolute: 0 10*3/uL (ref 0.0–0.1)
Basophils Relative: 1 %
Eosinophils Absolute: 0.2 10*3/uL (ref 0.0–0.5)
Eosinophils Relative: 3 %
HCT: 41.4 % (ref 36.0–46.0)
Hemoglobin: 13.9 g/dL (ref 12.0–15.0)
Immature Granulocytes: 0 %
Lymphocytes Relative: 29 %
Lymphs Abs: 1.6 10*3/uL (ref 0.7–4.0)
MCH: 33.4 pg (ref 26.0–34.0)
MCHC: 33.6 g/dL (ref 30.0–36.0)
MCV: 99.5 fL (ref 80.0–100.0)
Monocytes Absolute: 0.5 10*3/uL (ref 0.1–1.0)
Monocytes Relative: 9 %
Neutro Abs: 3.2 10*3/uL (ref 1.7–7.7)
Neutrophils Relative %: 58 %
Platelets: 314 10*3/uL (ref 150–400)
RBC: 4.16 MIL/uL (ref 3.87–5.11)
RDW: 12.8 % (ref 11.5–15.5)
WBC: 5.5 10*3/uL (ref 4.0–10.5)
nRBC: 0 % (ref 0.0–0.2)

## 2021-06-21 MED ORDER — SODIUM CHLORIDE 0.9 % IV SOLN
1000.0000 mg | Freq: Once | INTRAVENOUS | Status: AC
  Administered 2021-06-21: 1000 mg via INTRAVENOUS
  Filled 2021-06-21: qty 40

## 2021-06-21 MED ORDER — SODIUM CHLORIDE 0.9 % IV SOLN: INTRAVENOUS | Status: DC

## 2021-06-21 NOTE — Progress Notes (Signed)
Pt stated she took tylenol and benadryl at home before coming to hospital.

## 2021-06-21 NOTE — Progress Notes (Signed)
CBC and CMP are normal.

## 2021-06-23 LAB — QUANTIFERON-TB GOLD PLUS: QuantiFERON-TB Gold Plus: NEGATIVE

## 2021-06-23 LAB — QUANTIFERON-TB GOLD PLUS (RQFGPL)
QuantiFERON Mitogen Value: >10 IU/mL
QuantiFERON Nil Value: 0.02 IU/mL
QuantiFERON TB1 Ag Value: 0.03 IU/mL
QuantiFERON TB2 Ag Value: 0.02 IU/mL

## 2021-06-23 NOTE — Progress Notes (Signed)
TB gold negative

## 2021-06-30 ENCOUNTER — Other Ambulatory Visit: Payer: Self-pay | Admitting: Physician Assistant

## 2021-06-30 DIAGNOSIS — M0579 Rheumatoid arthritis with rheumatoid factor of multiple sites without organ or systems involvement: Secondary | ICD-10-CM

## 2021-06-30 NOTE — Telephone Encounter (Signed)
Next Visit: 10/05/2021  Last Visit: 05/04/2021  Labs: 06/21/2021 CBC and CMP are normal.   Eye exam: 11/23/2020 WNL   Current Dose per office note 05/04/2021: Plaquenil 200 mg in the morning and 100 mg at bedtime.   DX: Rheumatoid arthritis with rheumatoid factor of multiple sites without organ or systems involvement   Last Fill: 04/04/2021  Okay to refill Plaquenil?

## 2021-07-19 ENCOUNTER — Encounter (HOSPITAL_COMMUNITY)
Admission: RE | Admit: 2021-07-19 | Discharge: 2021-07-19 | Disposition: A | Discharge status: Home / Self Care | Payer: Medicare Other | Source: Ambulatory Visit | Attending: Rheumatology | Admitting: Rheumatology

## 2021-07-19 ENCOUNTER — Encounter (HOSPITAL_COMMUNITY): Payer: Self-pay

## 2021-07-19 DIAGNOSIS — M0579 Rheumatoid arthritis with rheumatoid factor of multiple sites without organ or systems involvement: Secondary | ICD-10-CM | POA: Diagnosis not present

## 2021-07-19 MED ORDER — ACETAMINOPHEN 325 MG PO TABS: 650.0000 mg | ORAL_TABLET | Freq: Once | ORAL | Status: DC

## 2021-07-19 MED ORDER — SODIUM CHLORIDE 0.9 % IV SOLN: Freq: Once | INTRAVENOUS | Status: AC

## 2021-07-19 MED ORDER — SODIUM CHLORIDE 0.9 % IV SOLN
1000.0000 mg | Freq: Once | INTRAVENOUS | Status: AC
  Administered 2021-07-19: 1000 mg via INTRAVENOUS
  Filled 2021-07-19: qty 40

## 2021-07-19 MED ORDER — DIPHENHYDRAMINE HCL 25 MG PO CAPS: 25.0000 mg | ORAL_CAPSULE | Freq: Once | ORAL | Status: DC

## 2021-08-16 ENCOUNTER — Encounter (HOSPITAL_COMMUNITY): Payer: Medicare Other

## 2021-08-16 ENCOUNTER — Encounter (HOSPITAL_COMMUNITY)
Admission: RE | Admit: 2021-08-16 | Discharge: 2021-08-16 | Disposition: A | Discharge status: Home / Self Care | Payer: Medicare Other | Source: Ambulatory Visit | Attending: Rheumatology | Admitting: Rheumatology

## 2021-08-16 VITALS — BP 138/73 | Temp 98.0°F | Resp 16 | Ht 62.5 in | Wt 220.5 lb

## 2021-08-16 DIAGNOSIS — M0579 Rheumatoid arthritis with rheumatoid factor of multiple sites without organ or systems involvement: Secondary | ICD-10-CM | POA: Diagnosis not present

## 2021-08-16 DIAGNOSIS — Z8639 Personal history of other endocrine, nutritional and metabolic disease: Secondary | ICD-10-CM

## 2021-08-16 DIAGNOSIS — M8589 Other specified disorders of bone density and structure, multiple sites: Secondary | ICD-10-CM

## 2021-08-16 LAB — CBC WITH DIFFERENTIAL/PLATELET
Abs Immature Granulocytes: 0.01 10*3/uL (ref 0.00–0.07)
Basophils Absolute: 0 10*3/uL (ref 0.0–0.1)
Basophils Relative: 1 %
Eosinophils Absolute: 0.1 10*3/uL (ref 0.0–0.5)
Eosinophils Relative: 2 %
HCT: 38.8 % (ref 36.0–46.0)
Hemoglobin: 12.9 g/dL (ref 12.0–15.0)
Immature Granulocytes: 0 %
Lymphocytes Relative: 25 %
Lymphs Abs: 1.6 10*3/uL (ref 0.7–4.0)
MCH: 32.6 pg (ref 26.0–34.0)
MCHC: 33.2 g/dL (ref 30.0–36.0)
MCV: 98 fL (ref 80.0–100.0)
Monocytes Absolute: 0.5 10*3/uL (ref 0.1–1.0)
Monocytes Relative: 8 %
Neutro Abs: 4 10*3/uL (ref 1.7–7.7)
Neutrophils Relative %: 64 %
Platelets: 288 10*3/uL (ref 150–400)
RBC: 3.96 MIL/uL (ref 3.87–5.11)
RDW: 12.8 % (ref 11.5–15.5)
WBC: 6.2 10*3/uL (ref 4.0–10.5)
nRBC: 0 % (ref 0.0–0.2)

## 2021-08-16 LAB — COMPREHENSIVE METABOLIC PANEL
ALT: 12 U/L (ref 0–44)
AST: 16 U/L (ref 15–41)
Albumin: 3.8 g/dL (ref 3.5–5.0)
Alkaline Phosphatase: 61 U/L (ref 38–126)
Anion gap: 8 (ref 5–15)
BUN: 16 mg/dL (ref 8–23)
CO2: 27 mmol/L (ref 22–32)
Calcium: 9 mg/dL (ref 8.9–10.3)
Chloride: 104 mmol/L (ref 98–111)
Creatinine, Ser: 0.75 mg/dL (ref 0.44–1.00)
GFR, Estimated: >60 mL/min (ref >60)
Glucose, Bld: 95 mg/dL (ref 70–99)
Potassium: 3.7 mmol/L (ref 3.5–5.1)
Sodium: 139 mmol/L (ref 135–145)
Total Bilirubin: 0.6 mg/dL (ref 0.3–1.2)
Total Protein: 6.9 g/dL (ref 6.5–8.1)

## 2021-08-16 MED ORDER — SODIUM CHLORIDE 0.9 % IV SOLN: Freq: Once | INTRAVENOUS | Status: AC

## 2021-08-16 MED ORDER — DIPHENHYDRAMINE HCL 25 MG PO CAPS: 25.0000 mg | ORAL_CAPSULE | Freq: Once | ORAL | Status: DC

## 2021-08-16 MED ORDER — ACETAMINOPHEN 325 MG PO TABS: 650.0000 mg | ORAL_TABLET | Freq: Once | ORAL | Status: DC

## 2021-08-16 MED ORDER — SODIUM CHLORIDE 0.9 % IV SOLN
1000.0000 mg | Freq: Once | INTRAVENOUS | Status: AC
  Administered 2021-08-16: 1000 mg via INTRAVENOUS
  Filled 2021-08-16: qty 40

## 2021-08-16 NOTE — Progress Notes (Signed)
CBC and CMP are normal.

## 2021-08-17 ENCOUNTER — Telehealth: Payer: Self-pay | Admitting: Pharmacist

## 2021-08-17 ENCOUNTER — Telehealth: Payer: Self-pay | Admitting: Pharmacy Technician

## 2021-08-17 NOTE — Telephone Encounter (Signed)
Received call from patient stating that she received letter from [facility] stating that referral needs to be placed [facility]. Patient would not like to receive infusion at hospital. ? ?Therapy plan placed with infusion center ? ?Dose: 1000mg  every 28 days (appropriate based on last recorded weight of 100kg) ?  ?Last Clinic Visit: 05/04/21 ?Next Clinic Visit: 10/05/21 ?  ?Labs:  08/16/21 - CBC , CMP wnl ?TB Gold: negative on 06/21/21  ? ?Monica [name], PharmD, MPH, BCPS ?Clinical Pharmacist (Rheumatology and Pulmonology) ?

## 2021-08-17 NOTE — Telephone Encounter (Signed)
Auth Submission: NO AUTH NEEDED ?Payer: MEDICARE A/B - BCBS SUPP ?Medication & CPT/J Code(s) submitted:  ?ORENCIA X6728 ?Route of submission (phone, fax, portal): PHONE: [phone] ?Auth type: Buy/Bill ?Units/visits requested: 1015m Q28D ?Reference number: [id number]?Approval from: 08/17/21 to 05/07/22  ?Patient has not met her $226 deductible. ?Medicare will cover 80% and BCBS will pick up remaining 20% ?

## 2021-09-02 DIAGNOSIS — Z0000 Encounter for general adult medical examination without abnormal findings: Secondary | ICD-10-CM | POA: Diagnosis not present

## 2021-09-02 DIAGNOSIS — Z1389 Encounter for screening for other disorder: Secondary | ICD-10-CM | POA: Diagnosis not present

## 2021-09-13 ENCOUNTER — Ambulatory Visit (INDEPENDENT_AMBULATORY_CARE_PROVIDER_SITE_OTHER): Payer: Medicare Other

## 2021-09-13 VITALS — BP 144/94 | HR 63 | Temp 98.2°F | Resp 16 | Ht 62.5 in | Wt 221.8 lb

## 2021-09-13 DIAGNOSIS — M0579 Rheumatoid arthritis with rheumatoid factor of multiple sites without organ or systems involvement: Secondary | ICD-10-CM

## 2021-09-13 DIAGNOSIS — Z79899 Other long term (current) drug therapy: Secondary | ICD-10-CM

## 2021-09-13 MED ORDER — SODIUM CHLORIDE 0.9 % IV SOLN
1000.0000 mg | Freq: Once | INTRAVENOUS | Status: AC
  Administered 2021-09-13: 1000 mg via INTRAVENOUS
  Filled 2021-09-13: qty 40

## 2021-09-13 MED ORDER — ACETAMINOPHEN 325 MG PO TABS: 650.0000 mg | ORAL_TABLET | Freq: Once | ORAL | Status: DC

## 2021-09-13 MED ORDER — DIPHENHYDRAMINE HCL 25 MG PO CAPS: 25.0000 mg | ORAL_CAPSULE | Freq: Once | ORAL | Status: DC

## 2021-09-13 NOTE — Progress Notes (Signed)
Diagnosis: Rheumatoid Arthritis ? ?Provider:  Chilton GreathousePraveen Mannam, MD ? ?Procedure: Infusion ? ?IV Type: Peripheral, IV Location: L Antecubital ? ?Orencia (Abatacept), Dose: 1000 mg ? ?Infusion Start Time: 1037 ? ?Infusion Stop Time: 1114 ? ?Post Infusion IV Care: Peripheral IV Discontinued ? ?Discharge: Condition: Good, Destination: Home . AVS provided to patient.  ? ?Performed by:  Nat Mathathy Deshia Vanderhoof, RN  ?  ?

## 2021-09-21 NOTE — Progress Notes (Signed)
Office Visit Note  Patient: Monica [name]             Date of Birth: Jun 07, 1940           MRN: [id number]             PCP: Soundra Pilon, FNP Referring: Clayborn Heron, MD Visit Date: 10/05/2021 Occupation: @  Subjective:  Increased pain in hands  History of Present Illness: Monica [name] is a 81 y.o. female with history of rheumatoid arthritis, and osteoarthritis.  She states she has been having increased pain and stiffness in her right hand especially in the right little finger.  She notices swelling in the right  little finger.  She continues to have pain and discomfort in her knee joints.  She has off-and-on discomfort in her feet.  She has not noticed any swelling.  She states that she had 2 episodes of sudden muscle spasm in her lower back which lasted for few hours and resolved.  She has eating Orencia infusions on a regular basis.  She is taking Plaquenil 300 mg p.o. daily without any side effects.  Activities of Daily Living:  Patient reports morning stiffness for several  hours.   Patient Denies nocturnal pain.  Difficulty dressing/grooming: Denies Difficulty climbing stairs: Reports Difficulty getting out of chair: Denies Difficulty using hands for taps, buttons, cutlery, and/or writing: Reports  Review of Systems  Constitutional:  Positive for fatigue.  HENT:  Negative for mouth sores, mouth dryness and nose dryness.   Eyes:  Positive for dryness. Negative for pain and itching.  Respiratory:  Negative for shortness of breath and difficulty breathing.   Cardiovascular:  Negative for chest pain and palpitations.  Gastrointestinal:  Negative for blood in stool, constipation and diarrhea.  Endocrine: Negative for increased urination.  Genitourinary:  Negative for difficulty urinating.  Musculoskeletal:  Positive for joint pain, joint pain, joint swelling and morning stiffness. Negative for myalgias, muscle tenderness and myalgias.  Skin:  Negative for  color change, rash, redness and sensitivity to sunlight.  Allergic/Immunologic: Negative for susceptible to infections.  Neurological:  Negative for dizziness, numbness, headaches and memory loss.  Hematological:  Negative for bruising/bleeding tendency.  Psychiatric/Behavioral:  Positive for depressed mood. Negative for confusion and sleep disturbance. The patient is nervous/anxious.    PMFS History:  Patient Active Problem List   Diagnosis Date Noted   Asthmatic bronchitis 02/16/2019   Osteopenia 12/20/2016   History of depression 12/11/2016   High risk medication use 05/15/2016   Primary osteoarthritis of both hands 05/15/2016   Primary osteoarthritis of both feet 05/15/2016   Primary osteoarthritis of both knees 05/15/2016   Vitamin D deficiency 05/15/2016   History of hypertension 05/15/2016   History of hyperlipidemia 05/15/2016   Rheumatoid arthritis with rheumatoid factor of multiple sites without organ or systems involvement (HCC) 01/10/2016   Seasonal allergic rhinitis 10/03/2011   HYPERLIPIDEMIA 12/31/2008   HYPERTENSION 12/31/2008   Mild persistent asthmatic bronchitis with exacerbation 12/31/2008    Past Medical History:  Diagnosis Date   Arthritis    Asthma    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, FEV1 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%   Hyperlipidemia    Hypertension     Family History  Problem Relation Age of Onset   Lung cancer Father    Alzheimer's disease Mother    Past Surgical History:  Procedure Laterality Date   CARPAL TUNNEL RELEASE     FOOT SURGERY     MOUTH SURGERY Right 05/13/2020  Social History   Social History Narrative   Not on file   Immunization History  Administered Date(s) Administered   Fluad Quad(high Dose 65+) 02/13/2019, 02/18/2020, 02/17/2021   Influenza Nasal 02/29/2012   Influenza Split 03/02/2011   Influenza, High Dose Seasonal PF 02/12/2017, 02/12/2018   Influenza,inj,Quad PF,6+ Mos 02/25/2013, 02/23/2014   Influenza-Unspecified 03/15/2015    Moderna Sars-Covid-2 Vaccination 07/16/2019, 08/13/2019, 08/06/2020   Pneumococcal Conjugate-13 02/25/2013   Zoster Recombinat (Shingrix) 09/27/2021     Objective: Vital Signs: BP 136/84 (BP Location: Left Arm, Patient Position: Sitting, Cuff Size: Normal)   Pulse 67   Ht 5' 2.5" (1.588 m)   Wt 222 lb 12.8 oz (101.1 kg)   BMI 40.10 kg/m    Physical Exam Vitals and nursing note reviewed.  Constitutional:      Appearance: She is well-developed.  HENT:     Head: Normocephalic and atraumatic.  Eyes:     Conjunctiva/sclera: Conjunctivae normal.  Cardiovascular:     Rate and Rhythm: Normal rate and regular rhythm.     Heart sounds: Normal heart sounds.  Pulmonary:     Effort: Pulmonary effort is normal.     Breath sounds: Normal breath sounds.  Abdominal:     General: Bowel sounds are normal.     Palpations: Abdomen is soft.  Musculoskeletal:     Cervical back: Normal range of motion.  Lymphadenopathy:     Cervical: No cervical adenopathy.  Skin:    General: Skin is warm and dry.     Capillary Refill: Capillary refill takes less than 2 seconds.  Neurological:     Mental Status: She is alert and oriented to person, place, and time.  Psychiatric:        Behavior: Behavior normal.     Musculoskeletal Exam: C-spine was in good range of motion.  She describes discomfort in the right paravertebral lumbar region.  Shoulder joints, elbow joints, wrist joints, MCPs PIPs and DIPs with good range of motion.  She had swelling over her right fifth PIP joint.  She had bilateral PIP and DIP thickening.  Hip joints and knee joints with good range of motion.  She had discomfort range of motion of her knee joints.  She had no tenderness over ankles or MTPs.  CDAI Exam: CDAI Score: 2.6  Patient Global: 3 mm; Provider Global: 3 mm Swollen: 1 ; Tender: 1  Joint Exam 10/05/2021      Right  Left  PIP 5  Swollen Tender        Investigation: No additional findings.  Imaging: No results  found.  Recent Labs: Lab Results  Component Value Date   WBC 6.2 08/16/2021   HGB 12.9 08/16/2021   PLT 288 08/16/2021   NA 139 08/16/2021   K 3.7 08/16/2021   CL 104 08/16/2021   CO2 27 08/16/2021   GLUCOSE 95 08/16/2021   BUN 16 08/16/2021   CREATININE 0.75 08/16/2021   BILITOT 0.6 08/16/2021   ALKPHOS 61 08/16/2021   AST 16 08/16/2021   ALT 12 08/16/2021   PROT 6.9 08/16/2021   ALBUMIN 3.8 08/16/2021   CALCIUM 9.0 08/16/2021   GFRAA >60 01/27/2020   QFTBGOLD Negative 07/24/2016   QFTBGOLDPLUS Negative 06/21/2021    Speciality Comments: PLQ eye exam:11/23/2020 WNL @ [facility]. Follow up in 1 year.  Orencia 1000 mg IV q 28days started on 09/2016  Prior therapy includes: Simponi Aria (inadequate response)  Procedures:  No procedures performed Allergies: Arava [leflunomide] and Piroxicam  Assessment / Plan:     Visit Diagnoses: Rheumatoid arthritis with rheumatoid factor of multiple sites without organ or systems involvement (HCC)-patient had no synovitis on my examination except for swelling in her right fifth PIP joint.  She uses a cane with the right hand which could be contributing to the swelling.  She is tolerating Orencia and Plaquenil without any side effects.  High risk medication use - Orencia IV 1000 mg every 28 days and Plaquenil 200 mg in the morning and 100 mg at bedtime. PLQ eye exam:11/23/2020.  Labs from August 16, 2021 were reviewed, CBC with differential and CMP with GFR with normal.  TB gold was negative on June 21 2021.   She was advised to hold Orencia if she develops an infection.  Primary osteoarthritis of both hands -she complains of pain and discomfort in her bilateral hands.  No synovitis was noted except for the swelling in the right fifth PIP joint.  Plan: XR Hand 2 View Right, XR Hand 2 View Left.  X-rays were consistent with rheumatoid arthritis and osteoarthritis.  Increased narrowing of bilateral fifth PIP joints was  noted.  Primary osteoarthritis of both knees -she continues to have pain and discomfort in her bilateral knee joints.  X-rays in the past showed severe osteoarthritis and severe chondromalacia patella.  Primary osteoarthritis of both feet - X-rays were consistent with rheumatoid arthritis and osteoarthritis overlap.  She has dorsal spurs noted.  She has been having increased discomfort in her feet.  I will obtain x-rays today.- Plan: XR Foot 2 Views Right, XR Foot 2 Views Left.  X-rays are consistent with rheumatoid arthritis and osteoarthritis.  No radiographic progression was noted when compared to the x-rays of 2020.  Acute midline low back pain without sciatica-she had 2 episodes of sudden sharp pain in her lower back which resolved.  She had limited mobility in her back due to body habitus.  I gave her a handout on back exercises.  She declined x-rays.  Osteopenia of multiple sites - DEXA 11/10/2020 T-score: -1.9, BMD: 0.639 right femoral neck.  Use of calcium rich diet was suggested.  She is also taking vitamin D supplement.  Regular exercise was advised.  History of vitamin D deficiency-she is on vitamin D.  History of hypertension-blood pressure was normal today.  History of hyperlipidemia  History of asthma  History of depression  Orders: Orders Placed This Encounter  Procedures   XR Hand 2 View Right   XR Hand 2 View Left   XR Foot 2 Views Right   XR Foot 2 Views Left   No orders of the defined types were placed in this encounter.    Follow-Up Instructions: Return in about 5 months (around 03/07/2022) for Rheumatoid arthritis, Osteoarthritis.   Monica SavoyShaili Karter Hellmer, MD  Note - This record has been created using Animal nutritionistDragon software.  Chart creation errors have been sought, but may not always  have been located. Such creation errors do not reflect on  the standard of medical care.

## 2021-09-27 DIAGNOSIS — E7800 Pure hypercholesterolemia, unspecified: Secondary | ICD-10-CM | POA: Diagnosis not present

## 2021-09-27 DIAGNOSIS — I10 Essential (primary) hypertension: Secondary | ICD-10-CM | POA: Diagnosis not present

## 2021-09-27 DIAGNOSIS — Z23 Encounter for immunization: Secondary | ICD-10-CM | POA: Diagnosis not present

## 2021-09-27 DIAGNOSIS — M069 Rheumatoid arthritis, unspecified: Secondary | ICD-10-CM | POA: Diagnosis not present

## 2021-09-27 DIAGNOSIS — F3341 Major depressive disorder, recurrent, in partial remission: Secondary | ICD-10-CM | POA: Diagnosis not present

## 2021-09-27 DIAGNOSIS — R7303 Prediabetes: Secondary | ICD-10-CM | POA: Diagnosis not present

## 2021-09-27 DIAGNOSIS — F418 Other specified anxiety disorders: Secondary | ICD-10-CM | POA: Diagnosis not present

## 2021-09-28 ENCOUNTER — Other Ambulatory Visit: Payer: Self-pay | Admitting: Physician Assistant

## 2021-09-28 DIAGNOSIS — M0579 Rheumatoid arthritis with rheumatoid factor of multiple sites without organ or systems involvement: Secondary | ICD-10-CM

## 2021-09-28 NOTE — Telephone Encounter (Signed)
Next Visit: 10/05/2021  Last Visit: 05/04/2022  Labs:08/16/2021 CBC and CMP are normal.   Eye exam: 11/23/2020 WNL    Current Dose per office note 06/30/2021: Plaquenil 200 mg in the morning and 100 mg at bedtime.    NO:[id number] arthritis with rheumatoid factor of multiple sites without organ or systems involvement   Last Fill: 06/30/2021  Okay to refill Plaquenil?

## 2021-10-05 ENCOUNTER — Encounter: Payer: Self-pay | Admitting: Rheumatology

## 2021-10-05 ENCOUNTER — Ambulatory Visit (INDEPENDENT_AMBULATORY_CARE_PROVIDER_SITE_OTHER): Payer: Medicare Other

## 2021-10-05 ENCOUNTER — Ambulatory Visit (INDEPENDENT_AMBULATORY_CARE_PROVIDER_SITE_OTHER): Payer: Medicare Other | Admitting: Rheumatology

## 2021-10-05 VITALS — BP 136/84 | HR 67 | Ht 62.5 in | Wt 222.8 lb

## 2021-10-05 DIAGNOSIS — Z8679 Personal history of other diseases of the circulatory system: Secondary | ICD-10-CM | POA: Diagnosis not present

## 2021-10-05 DIAGNOSIS — M19042 Primary osteoarthritis, left hand: Secondary | ICD-10-CM

## 2021-10-05 DIAGNOSIS — M170 Bilateral primary osteoarthritis of knee: Secondary | ICD-10-CM

## 2021-10-05 DIAGNOSIS — M545 Low back pain, unspecified: Secondary | ICD-10-CM | POA: Diagnosis not present

## 2021-10-05 DIAGNOSIS — M19041 Primary osteoarthritis, right hand: Secondary | ICD-10-CM

## 2021-10-05 DIAGNOSIS — Z8709 Personal history of other diseases of the respiratory system: Secondary | ICD-10-CM

## 2021-10-05 DIAGNOSIS — Z8639 Personal history of other endocrine, nutritional and metabolic disease: Secondary | ICD-10-CM | POA: Diagnosis not present

## 2021-10-05 DIAGNOSIS — Z79899 Other long term (current) drug therapy: Secondary | ICD-10-CM

## 2021-10-05 DIAGNOSIS — M19071 Primary osteoarthritis, right ankle and foot: Secondary | ICD-10-CM

## 2021-10-05 DIAGNOSIS — M0579 Rheumatoid arthritis with rheumatoid factor of multiple sites without organ or systems involvement: Secondary | ICD-10-CM

## 2021-10-05 DIAGNOSIS — M8589 Other specified disorders of bone density and structure, multiple sites: Secondary | ICD-10-CM | POA: Diagnosis not present

## 2021-10-05 DIAGNOSIS — M19072 Primary osteoarthritis, left ankle and foot: Secondary | ICD-10-CM

## 2021-10-05 DIAGNOSIS — Z8659 Personal history of other mental and behavioral disorders: Secondary | ICD-10-CM

## 2021-10-05 NOTE — Patient Instructions (Signed)

## 2021-10-11 ENCOUNTER — Ambulatory Visit (INDEPENDENT_AMBULATORY_CARE_PROVIDER_SITE_OTHER): Payer: Medicare Other

## 2021-10-11 VITALS — BP 149/84 | HR 81 | Temp 97.6°F | Resp 18 | Ht 62.0 in | Wt 218.1 lb

## 2021-10-11 DIAGNOSIS — Z79899 Other long term (current) drug therapy: Secondary | ICD-10-CM

## 2021-10-11 DIAGNOSIS — M0579 Rheumatoid arthritis with rheumatoid factor of multiple sites without organ or systems involvement: Secondary | ICD-10-CM

## 2021-10-11 LAB — CBC WITH DIFFERENTIAL/PLATELET
Basophils Absolute: 0 10*3/uL (ref 0.0–0.1)
Basophils Relative: 0.7 % (ref 0.0–3.0)
Eosinophils Absolute: 0.1 10*3/uL (ref 0.0–0.7)
Eosinophils Relative: 1.6 % (ref 0.0–5.0)
HCT: 38.1 % (ref 36.0–46.0)
Hemoglobin: 13.1 g/dL (ref 12.0–15.0)
Lymphocytes Relative: 22.3 % (ref 12.0–46.0)
Lymphs Abs: 1.4 10*3/uL (ref 0.7–4.0)
MCHC: 34.4 g/dL (ref 30.0–36.0)
MCV: 95.8 fl (ref 78.0–100.0)
Monocytes Absolute: 0.5 10*3/uL (ref 0.1–1.0)
Monocytes Relative: 8.3 % (ref 3.0–12.0)
Neutro Abs: 4.3 10*3/uL (ref 1.4–7.7)
Neutrophils Relative %: 67.1 % (ref 43.0–77.0)
Platelets: 299 10*3/uL (ref 150.0–400.0)
RBC: 3.98 Mil/uL (ref 3.87–5.11)
RDW: 12.8 % (ref 11.5–15.5)
WBC: 6.3 10*3/uL (ref 4.0–10.5)

## 2021-10-11 LAB — COMPREHENSIVE METABOLIC PANEL
ALT: 11 U/L (ref 0–35)
AST: 17 U/L (ref 0–37)
Albumin: 4 g/dL (ref 3.5–5.2)
Alkaline Phosphatase: 55 U/L (ref 39–117)
BUN: 19 mg/dL (ref 6–23)
CO2: 26 mEq/L (ref 19–32)
Calcium: 9.7 mg/dL (ref 8.4–10.5)
Chloride: 103 mEq/L (ref 96–112)
Creatinine, Ser: 0.77 mg/dL (ref 0.40–1.20)
GFR: 72.74 mL/min (ref >60.00)
Glucose, Bld: 92 mg/dL (ref 70–99)
Potassium: 3.5 mEq/L (ref 3.5–5.1)
Sodium: 139 mEq/L (ref 135–145)
Total Bilirubin: 0.8 mg/dL (ref 0.2–1.2)
Total Protein: 6.9 g/dL (ref 6.0–8.3)

## 2021-10-11 MED ORDER — SODIUM CHLORIDE 0.9 % IV SOLN: Freq: Once | INTRAVENOUS | Status: AC | PRN

## 2021-10-11 MED ORDER — DIPHENHYDRAMINE HCL 25 MG PO CAPS: 25.0000 mg | ORAL_CAPSULE | Freq: Once | ORAL | Status: DC

## 2021-10-11 MED ORDER — EPINEPHRINE 0.3 MG/0.3ML IJ SOAJ: 0.3000 mg | Freq: Once | INTRAMUSCULAR | Status: DC | PRN

## 2021-10-11 MED ORDER — METHYLPREDNISOLONE SODIUM SUCC 125 MG IJ SOLR: 125.0000 mg | Freq: Once | INTRAMUSCULAR | Status: DC | PRN

## 2021-10-11 MED ORDER — ALBUTEROL SULFATE HFA 108 (90 BASE) MCG/ACT IN AERS: 2.0000 | INHALATION_SPRAY | Freq: Once | RESPIRATORY_TRACT | Status: DC | PRN

## 2021-10-11 MED ORDER — FAMOTIDINE IN NACL 20-0.9 MG/50ML-% IV SOLN: 20.0000 mg | Freq: Once | INTRAVENOUS | Status: DC | PRN

## 2021-10-11 MED ORDER — DIPHENHYDRAMINE HCL 50 MG/ML IJ SOLN: 50.0000 mg | Freq: Once | INTRAMUSCULAR | Status: DC | PRN

## 2021-10-11 MED ORDER — ACETAMINOPHEN 325 MG PO TABS: 650.0000 mg | ORAL_TABLET | Freq: Once | ORAL | Status: DC

## 2021-10-11 MED ORDER — SODIUM CHLORIDE 0.9 % IV SOLN
1000.0000 mg | Freq: Once | INTRAVENOUS | Status: AC
  Administered 2021-10-11: 1000 mg via INTRAVENOUS
  Filled 2021-10-11: qty 40

## 2021-10-11 NOTE — Progress Notes (Signed)
Diagnosis: Rheumatoid narthritis  Provider:  Chilton Greathouse, MD  Procedure: Infusion  IV Type: Peripheral, IV Location: L Antecubital  Orencia (Abatacept), Dose: 1000 mg  Infusion Start Time: 1138 am  Infusion Stop Time: 1233pm   Post Infusion IV Care: Observation period completed and Peripheral IV Discontinued  Discharge: Condition: Good, Destination: Home . AVS provided to patient.   Performed by:  Forrest Moron, RN

## 2021-10-14 ENCOUNTER — Telehealth: Payer: Self-pay | Admitting: Rheumatology

## 2021-10-14 NOTE — Telephone Encounter (Signed)
CBC and CMP were normal on October 11, 2021.

## 2021-10-14 NOTE — Telephone Encounter (Signed)
Patient called the office stating she had her labs prior to [place] on Tuesday. Patient states no ones has reached out yet to go over those results. Patient requests a return call.

## 2021-10-17 NOTE — Telephone Encounter (Signed)
Patient advised CBC and CMP were normal on October 11, 2021.

## 2021-11-11 DIAGNOSIS — Z79899 Other long term (current) drug therapy: Secondary | ICD-10-CM | POA: Diagnosis not present

## 2021-11-11 DIAGNOSIS — H524 Presbyopia: Secondary | ICD-10-CM | POA: Diagnosis not present

## 2021-11-11 DIAGNOSIS — H2512 Age-related nuclear cataract, left eye: Secondary | ICD-10-CM | POA: Diagnosis not present

## 2021-11-11 DIAGNOSIS — H43813 Vitreous degeneration, bilateral: Secondary | ICD-10-CM | POA: Diagnosis not present

## 2021-11-15 ENCOUNTER — Other Ambulatory Visit: Payer: Self-pay | Admitting: Pharmacist

## 2021-11-15 ENCOUNTER — Ambulatory Visit (INDEPENDENT_AMBULATORY_CARE_PROVIDER_SITE_OTHER): Payer: Medicare Other

## 2021-11-15 VITALS — BP 129/80 | HR 58 | Temp 98.0°F | Resp 16 | Ht 62.0 in | Wt 220.4 lb

## 2021-11-15 DIAGNOSIS — Z79899 Other long term (current) drug therapy: Secondary | ICD-10-CM | POA: Diagnosis not present

## 2021-11-15 DIAGNOSIS — M0579 Rheumatoid arthritis with rheumatoid factor of multiple sites without organ or systems involvement: Secondary | ICD-10-CM

## 2021-11-15 MED ORDER — ACETAMINOPHEN 325 MG PO TABS: 650.0000 mg | ORAL_TABLET | Freq: Once | ORAL | Status: DC

## 2021-11-15 MED ORDER — DIPHENHYDRAMINE HCL 25 MG PO CAPS: 25.0000 mg | ORAL_CAPSULE | Freq: Once | ORAL | Status: DC

## 2021-11-15 MED ORDER — SODIUM CHLORIDE 0.9 % IV SOLN
1000.0000 mg | Freq: Once | INTRAVENOUS | Status: AC
  Administered 2021-11-15: 1000 mg via INTRAVENOUS
  Filled 2021-11-15: qty 40

## 2021-11-15 NOTE — Progress Notes (Signed)
[id number] Diagnosis: Rheumatoid Arthritis  Provider:  Chilton Greathouse, MD  Procedure: Infusion  IV Type: Peripheral, IV Location: L Antecubital  Orencia (Abatacept), Dose: 1000 mg  Infusion Start Time: 1039  Infusion Stop Time: 1125  Post Infusion IV Care: Peripheral IV Discontinued  Discharge: Condition: Good, Destination: Home . AVS provided to patient.   Performed by:  Adriana Mccallum, RN

## 2021-11-15 NOTE — Progress Notes (Signed)
Next infusion scheduled for Orencia IV on 12/13/21 and due for updated orders. Diagnosis: RA  Dose: 1000mg  every 28 days (appropriate based on last recorded weight of 100kg. If weight changes to be <100kg, may have to dose reduce to 750mg )  Last Clinic Visit: 10/05/21 Next Clinic Visit: 03/01/2022  Last infusion: 11/15/21  Labs: CBC and CMP on 10/11/21 TB Gold: negative on 06/21/21   Orders placed for Orencia IV x 3 doses along with premedication of acetaminophen and diphenhydramine to be administered 30 minutes before medication infusion.  Standing CBC with diff/platelet and CMP with GFR orders placed to be drawn every 2 months.  Next TB gold due 06/21/22  06/23/21, PharmD, MPH, BCPS, CPP Clinical Pharmacist (Rheumatology and Pulmonology)

## 2021-11-18 ENCOUNTER — Encounter: Payer: Self-pay | Admitting: Rheumatology

## 2021-12-01 DIAGNOSIS — I10 Essential (primary) hypertension: Secondary | ICD-10-CM | POA: Diagnosis not present

## 2021-12-01 DIAGNOSIS — R7303 Prediabetes: Secondary | ICD-10-CM | POA: Diagnosis not present

## 2021-12-01 DIAGNOSIS — E7800 Pure hypercholesterolemia, unspecified: Secondary | ICD-10-CM | POA: Diagnosis not present

## 2021-12-13 ENCOUNTER — Other Ambulatory Visit (INDEPENDENT_AMBULATORY_CARE_PROVIDER_SITE_OTHER): Payer: Medicare Other

## 2021-12-13 ENCOUNTER — Ambulatory Visit (INDEPENDENT_AMBULATORY_CARE_PROVIDER_SITE_OTHER): Payer: Medicare Other

## 2021-12-13 VITALS — BP 128/80 | HR 60 | Temp 98.0°F | Resp 18 | Ht 62.0 in | Wt 219.6 lb

## 2021-12-13 DIAGNOSIS — M0579 Rheumatoid arthritis with rheumatoid factor of multiple sites without organ or systems involvement: Secondary | ICD-10-CM

## 2021-12-13 DIAGNOSIS — Z79899 Other long term (current) drug therapy: Secondary | ICD-10-CM

## 2021-12-13 LAB — CBC WITH DIFFERENTIAL/PLATELET
Basophils Absolute: 0 10*3/uL (ref 0.0–0.1)
Basophils Relative: 0.6 % (ref 0.0–3.0)
Eosinophils Absolute: 0.2 10*3/uL (ref 0.0–0.7)
Eosinophils Relative: 2.6 % (ref 0.0–5.0)
HCT: 37.4 % (ref 36.0–46.0)
Hemoglobin: 12.9 g/dL (ref 12.0–15.0)
Lymphocytes Relative: 21.7 % (ref 12.0–46.0)
Lymphs Abs: 1.5 10*3/uL (ref 0.7–4.0)
MCHC: 34.5 g/dL (ref 30.0–36.0)
MCV: 95.1 fl (ref 78.0–100.0)
Monocytes Absolute: 0.5 10*3/uL (ref 0.1–1.0)
Monocytes Relative: 7.1 % (ref 3.0–12.0)
Neutro Abs: 4.6 10*3/uL (ref 1.4–7.7)
Neutrophils Relative %: 68 % (ref 43.0–77.0)
Platelets: 292 10*3/uL (ref 150.0–400.0)
RBC: 3.93 Mil/uL (ref 3.87–5.11)
RDW: 12.7 % (ref 11.5–15.5)
WBC: 6.8 10*3/uL (ref 4.0–10.5)

## 2021-12-13 LAB — COMPREHENSIVE METABOLIC PANEL
ALT: 10 U/L (ref 0–35)
AST: 18 U/L (ref 0–37)
Albumin: 4 g/dL (ref 3.5–5.2)
Alkaline Phosphatase: 58 U/L (ref 39–117)
BUN: 17 mg/dL (ref 6–23)
CO2: 27 mEq/L (ref 19–32)
Calcium: 9 mg/dL (ref 8.4–10.5)
Chloride: 103 mEq/L (ref 96–112)
Creatinine, Ser: 0.72 mg/dL (ref 0.40–1.20)
GFR: 78.74 mL/min (ref >60.00)
Glucose, Bld: 95 mg/dL (ref 70–99)
Potassium: 3.5 mEq/L (ref 3.5–5.1)
Sodium: 138 mEq/L (ref 135–145)
Total Bilirubin: 0.5 mg/dL (ref 0.2–1.2)
Total Protein: 6.7 g/dL (ref 6.0–8.3)

## 2021-12-13 MED ORDER — SODIUM CHLORIDE 0.9 % IV SOLN
1000.0000 mg | INTRAVENOUS | Status: DC
  Administered 2021-12-13: 1000 mg via INTRAVENOUS
  Filled 2021-12-13: qty 40

## 2021-12-13 MED ORDER — ACETAMINOPHEN 325 MG PO TABS: 650.0000 mg | ORAL_TABLET | Freq: Once | ORAL | Status: DC

## 2021-12-13 MED ORDER — DIPHENHYDRAMINE HCL 25 MG PO CAPS: 25.0000 mg | ORAL_CAPSULE | Freq: Once | ORAL | Status: DC

## 2021-12-13 NOTE — Progress Notes (Signed)
Diagnosis: Rheumatoid Arthritis  Provider:  Chilton Greathouse, MD  Procedure: Infusion  IV Type: Peripheral, IV Location: L Antecubital  Orencia (Abatacept), Dose: 1000 mg  Infusion Start Time: 1041  Infusion Stop Time: 1138  Post Infusion IV Care: Peripheral IV Discontinued  Discharge: Condition: Good, Destination: Home . AVS provided to patient.   Performed by:  Loney Hering, LPN

## 2021-12-27 DIAGNOSIS — H26491 Other secondary cataract, right eye: Secondary | ICD-10-CM | POA: Diagnosis not present

## 2021-12-28 ENCOUNTER — Other Ambulatory Visit: Payer: Self-pay | Admitting: Physician Assistant

## 2021-12-28 DIAGNOSIS — M0579 Rheumatoid arthritis with rheumatoid factor of multiple sites without organ or systems involvement: Secondary | ICD-10-CM

## 2021-12-28 NOTE — Telephone Encounter (Signed)
Next Visit: 03/01/2022  Last Visit: 10/05/2021  Labs: 12/13/2021 WNL  Eye exam: 11/11/2021 WNL   Current Dose per office note 10/05/2021: Plaquenil 200 mg in the morning and 100 mg at bedtime  DX: Rheumatoid arthritis with rheumatoid factor of multiple sites without organ or systems involvement   Last Fill: 09/28/2021  Okay to refill Plaquenil?

## 2022-01-10 ENCOUNTER — Encounter (HOSPITAL_COMMUNITY)
Admission: RE | Admit: 2022-01-10 | Discharge: 2022-01-10 | Disposition: A | Discharge status: Home / Self Care | Payer: Medicare Other | Source: Ambulatory Visit | Attending: Rheumatology | Admitting: Rheumatology

## 2022-01-10 ENCOUNTER — Ambulatory Visit: Payer: Medicare Other

## 2022-01-10 VITALS — BP 169/79 | HR 62 | Temp 97.9°F

## 2022-01-10 DIAGNOSIS — M0579 Rheumatoid arthritis with rheumatoid factor of multiple sites without organ or systems involvement: Secondary | ICD-10-CM | POA: Insufficient documentation

## 2022-01-10 MED ORDER — SODIUM CHLORIDE 0.9 % IV SOLN
1000.0000 mg | INTRAVENOUS | Status: DC
  Administered 2022-01-10: 1000 mg via INTRAVENOUS
  Filled 2022-01-10: qty 40

## 2022-01-10 MED ORDER — DIPHENHYDRAMINE HCL 25 MG PO CAPS: 25.0000 mg | ORAL_CAPSULE | Freq: Once | ORAL | Status: DC

## 2022-01-10 MED ORDER — ACETAMINOPHEN 325 MG PO TABS: 650.0000 mg | ORAL_TABLET | Freq: Once | ORAL | Status: DC

## 2022-01-10 NOTE — Progress Notes (Signed)
Diagnosis: Rheumatoid Arthritis  Provider:  Pollyann Savoy MD  Procedure: Infusion  IV Type: Peripheral, IV Location: L Antecubital  Orencia (Abatacept), Dose: 1000 mg  Infusion Start Time: 1001  Infusion Stop Time: 1039  Post Infusion IV Care: Observation period completed and Peripheral IV Discontinued  Discharge: Condition: Good, Destination: Home . AVS provided to patient.   Performed by:  Marin Shutter, RN

## 2022-02-07 ENCOUNTER — Encounter (HOSPITAL_COMMUNITY)
Admission: RE | Admit: 2022-02-07 | Discharge: 2022-02-07 | Disposition: A | Discharge status: Home / Self Care | Payer: Medicare Other | Source: Ambulatory Visit | Attending: Rheumatology | Admitting: Rheumatology

## 2022-02-07 VITALS — BP 142/73 | HR 64 | Temp 98.3°F | Resp 18 | Ht 63.0 in | Wt 219.0 lb

## 2022-02-07 DIAGNOSIS — Z79899 Other long term (current) drug therapy: Secondary | ICD-10-CM | POA: Diagnosis not present

## 2022-02-07 DIAGNOSIS — M0579 Rheumatoid arthritis with rheumatoid factor of multiple sites without organ or systems involvement: Secondary | ICD-10-CM | POA: Diagnosis not present

## 2022-02-07 LAB — CBC WITH DIFFERENTIAL/PLATELET
Abs Immature Granulocytes: 0.01 10*3/uL (ref 0.00–0.07)
Basophils Absolute: 0 10*3/uL (ref 0.0–0.1)
Basophils Relative: 1 %
Eosinophils Absolute: 0.2 10*3/uL (ref 0.0–0.5)
Eosinophils Relative: 3 %
HCT: 38.9 % (ref 36.0–46.0)
Hemoglobin: 13.1 g/dL (ref 12.0–15.0)
Immature Granulocytes: 0 %
Lymphocytes Relative: 26 %
Lymphs Abs: 1.6 10*3/uL (ref 0.7–4.0)
MCH: 32.8 pg (ref 26.0–34.0)
MCHC: 33.7 g/dL (ref 30.0–36.0)
MCV: 97.5 fL (ref 80.0–100.0)
Monocytes Absolute: 0.6 10*3/uL (ref 0.1–1.0)
Monocytes Relative: 9 %
Neutro Abs: 3.9 10*3/uL (ref 1.7–7.7)
Neutrophils Relative %: 61 %
Platelets: 303 10*3/uL (ref 150–400)
RBC: 3.99 MIL/uL (ref 3.87–5.11)
RDW: 12.7 % (ref 11.5–15.5)
WBC: 6.3 10*3/uL (ref 4.0–10.5)
nRBC: 0 % (ref 0.0–0.2)

## 2022-02-07 LAB — COMPREHENSIVE METABOLIC PANEL
ALT: 13 U/L (ref 0–44)
AST: 19 U/L (ref 15–41)
Albumin: 3.9 g/dL (ref 3.5–5.0)
Alkaline Phosphatase: 59 U/L (ref 38–126)
Anion gap: 9 (ref 5–15)
BUN: 12 mg/dL (ref 8–23)
CO2: 26 mmol/L (ref 22–32)
Calcium: 9.1 mg/dL (ref 8.9–10.3)
Chloride: 105 mmol/L (ref 98–111)
Creatinine, Ser: 0.74 mg/dL (ref 0.44–1.00)
GFR, Estimated: >60 mL/min (ref >60)
Glucose, Bld: 100 mg/dL — ABNORMAL HIGH (ref 70–99)
Potassium: 3.7 mmol/L (ref 3.5–5.1)
Sodium: 140 mmol/L (ref 135–145)
Total Bilirubin: 0.8 mg/dL (ref 0.3–1.2)
Total Protein: 6.9 g/dL (ref 6.5–8.1)

## 2022-02-07 MED ORDER — SODIUM CHLORIDE 0.9 % IV SOLN
1000.0000 mg | Freq: Once | INTRAVENOUS | Status: AC
  Administered 2022-02-07: 1000 mg via INTRAVENOUS
  Filled 2022-02-07: qty 40

## 2022-02-07 MED ORDER — ACETAMINOPHEN 325 MG PO TABS: 650.0000 mg | ORAL_TABLET | Freq: Once | ORAL | Status: DC

## 2022-02-07 MED ORDER — DIPHENHYDRAMINE HCL 25 MG PO CAPS: 25.0000 mg | ORAL_CAPSULE | Freq: Once | ORAL | Status: DC

## 2022-02-07 NOTE — Progress Notes (Signed)
CBC and CMP are normal.

## 2022-02-07 NOTE — Progress Notes (Signed)
Diagnosis: Rheumatoid Arthritis  Provider:  Bo Merino MD  Procedure: Infusion  IV Type: Peripheral, IV Location: L Antecubital  Orencia (Abatacept), Dose: 1000 mg  Infusion Start Time: 5852  Infusion Stop Time: 7782  Post Infusion IV Care: Patient declined observation and Peripheral IV Discontinued  Discharge: Condition: Good, Destination: Home . AVS provided to patient.   Performed by:  Jonelle Sidle, RN

## 2022-02-16 NOTE — Progress Notes (Unsigned)
HPI female never smoker followed for asthma, allergic rhinitis, complicated by rheumatoid arthritis PFT 02/02/09-mild obstruction small airways, minimal response bronchodilator, DLCO mildly reduced. FVC 3.36/116%, FEV1 2.48/119%, ratio 0.74, FEF 25-75% 1.80/75%, TLC 108%, DLCO 70% Office Spirometry 09/08/16-WNL-FVC 2.63/98%, FEV1 1.91/95%, ratio 0.73, FEF 25-75% 1.32/82% FENO 02/12/17- 32H -------------------------------------------------------------------------------------     02/17/21- 81 year old female never smoker followed for Asthmatic Bronchitis, chronic cough, Allergic Rhinitis, complicated by Rheumatoid Arthritis, Osteoarthritis, Depression, HTN,  -Trelegy, Ventolin hfa       Orencia and Plaquenil for RA Covid vax- 3 Moderna Flu vax- today senior -----Pt states no concerns Rare need for rescue inhaler-mostly in highly humid weather.  Has not had COVID infection. Bothersome postnasal drip. CXR 02/20/20- IMPRESSION: Lungs clear.  Stable cardiac silhouette.  02/17/22- 81 year old female never smoker followed for Asthmatic Bronchitis, chronic cough, Allergic Rhinitis, complicated by Rheumatoid Arthritis, Osteoarthritis, Depression, HTN,  -Trelegy, Ventolin hfa       Orencia and Plaquenil for RA Covid vax- 3 Moderna Flu vax-  ACT 23 Occasional cough drops seems to be adequate for cough control beyond her inhalers.  She has had no increased cough or change in chronic dyspnea on exertion and no acute issues.  Likes Trelegy.  ROS-see HPI    + = positive Constitutional:   No-   weight loss, night sweats, fevers, chills, fatigue, lassitude. HEENT:   No-  headaches, difficulty swallowing, tooth/dental problems, sore throat,       No-  sneezing, itching, ear ache, nasal congestion, +post nasal drip,  CV:  No-   chest pain, orthopnea, PND, +swelling in lower extremities, anasarca, dizziness, palpitations Resp:   + shortness of breath with exertion or at rest.              No-   productive  cough,   non-productive cough,  No- coughing up of blood.              No-   change in color of mucus.  +rare wheezing.   Skin: No-   rash or lesions. GI:  No-   heartburn, indigestion, abdominal pain, nausea, vomiting,  GU:  MS: + joint pain or swelling.   Neuro-     nothing unusual Psych:  No- change in mood or affect. No depression or anxiety.  No memory loss.  OBJ        General- Alert, Oriented, Affect-appropriate, Distress- none acute. + Morbidly obese Skin- rash-none, lesions- none, excoriation- none Lymphadenopathy- none Head- atraumatic            Eyes- Gross vision intact, PERRLA, conjunctivae clear secretions            Ears- Hearing, canals-normal            Nose- Clear, no-Septal dev, mucus, polyps, erosion, perforation             Throat- Mallampati III , mucosa clear , drainage- none, tonsils- atrophic. +throat clearing Neck- flexible , trachea midline, no stridor , thyroid nl, carotid no bruit Chest - symmetrical excursion , unlabored           Heart/CV- RRR , no murmur , no gallop  , no rub, nl s1 s2                           - JVD- none , edema- none, stasis changes- none, varices- none           Lung- clear, wheeze- none, cough- none ,  dullness-none, rub- none           Chest wall-  Abd-  Br/ Gen/ Rectal- Not done, not indicated Extrem- cyanosis- none, clubbing, none, atrophy- none, strength- nl Neuro- grossly intact to observation

## 2022-02-16 NOTE — Progress Notes (Signed)
Office Visit Note  Patient: Monica [name]             Date of Birth: 1940-06-10           MRN: [id number]             PCP: Soundra Pilon, FNP Referring: Soundra Pilon, FNP Visit Date: 03/01/2022 Occupation: @GUAROCC @  Subjective:  Pain in both hands   History of Present Illness: Monica [name] is a 81 y.o. female with history of seropositive rheumatoid arthritis and osteoarthritis.  Patient remains on IV Orencia 1000 mg infusions every 4 weeks and Plaquenil 200 mg in the morning and 100 mg in the evening.  Her last Orencia infusion was administered on 02/07/2022.  Patient reports that since July she has noticed increased pain in both hands.  She states that she has not noticed that the pain is worse before she is due for her infusions.  She states that the pain seems to be progressively worsening over the past 1 month.  She is also noticed some tenderness and locking of the right middle finger.  She continues to have some generalized aching and joint stiffness but overall the discomfort in her hands has been most severe.  She continues to use a cane to assist with ambulation. She denies any recent infections.     Activities of Daily Living:  Patient reports morning stiffness for several hours.   Patient Denies nocturnal pain.  Difficulty dressing/grooming: Denies Difficulty climbing stairs: Reports Difficulty getting out of chair: Reports Difficulty using hands for taps, buttons, cutlery, and/or writing: Reports  Review of Systems  Constitutional:  Positive for fatigue.  HENT:  Positive for mouth dryness. Negative for mouth sores.   Eyes:  Positive for dryness.  Respiratory:  Positive for shortness of breath.   Cardiovascular:  Negative for chest pain and palpitations.  Gastrointestinal:  Negative for blood in stool, constipation and diarrhea.  Endocrine: Negative for increased urination.  Genitourinary:  Negative for involuntary urination.  Musculoskeletal:  Positive for  joint pain, joint pain, joint swelling, myalgias, muscle weakness, morning stiffness, muscle tenderness and myalgias.  Skin:  Negative for color change, rash, hair loss and sensitivity to sunlight.  Allergic/Immunologic: Negative for susceptible to infections.  Neurological:  Negative for dizziness and headaches.  Hematological:  Negative for swollen glands.  Psychiatric/Behavioral:  Negative for depressed mood and sleep disturbance. The patient is not nervous/anxious.     PMFS History:  Patient Active Problem List   Diagnosis Date Noted   Asthmatic bronchitis 02/16/2019   Osteopenia 12/20/2016   History of depression 12/11/2016   High risk medication use 05/15/2016   Primary osteoarthritis of both hands 05/15/2016   Primary osteoarthritis of both feet 05/15/2016   Primary osteoarthritis of both knees 05/15/2016   Vitamin D deficiency 05/15/2016   History of hypertension 05/15/2016   History of hyperlipidemia 05/15/2016   Rheumatoid arthritis with rheumatoid factor of multiple sites without organ or systems involvement (HCC) 01/10/2016   Seasonal allergic rhinitis 10/03/2011   HYPERLIPIDEMIA 12/31/2008   HYPERTENSION 12/31/2008   Mild persistent asthmatic bronchitis with exacerbation 12/31/2008    Past Medical History:  Diagnosis Date   Arthritis    Asthma    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, FEV1 119%   Hyperlipidemia    Hypertension     Family History  Problem Relation Age of Onset   Lung cancer Father    Alzheimer's disease Mother    Past Surgical History:  Procedure Laterality Date   CARPAL TUNNEL RELEASE     FOOT SURGERY     MOUTH SURGERY Right 05/13/2020   Social History   Social History Narrative   Not on file   Immunization History  Administered Date(s) Administered   Fluad Quad(high Dose 65+) 02/13/2019, 02/18/2020, 02/17/2021   Influenza Nasal 02/29/2012   Influenza Split 03/02/2011   Influenza, High Dose Seasonal PF 02/12/2017, 02/12/2018    Influenza,inj,Quad PF,6+ Mos 02/25/2013, 02/23/2014   Influenza-Unspecified 03/15/2015   Moderna Sars-Covid-2 Vaccination 07/16/2019, 08/13/2019, 08/06/2020   Pneumococcal Conjugate-13 02/25/2013   Zoster Recombinat (Shingrix) 09/27/2021     Objective: Vital Signs: BP 137/84 (BP Location: Left Arm, Patient Position: Sitting, Cuff Size: Normal)   Pulse 65   Resp 15   Ht 5' 2.5" (1.588 m)   Wt 219 lb 6.4 oz (99.5 kg)   BMI 39.49 kg/m    Physical Exam Vitals and nursing note reviewed.  Constitutional:      Appearance: She is well-developed.  HENT:     Head: Normocephalic and atraumatic.  Eyes:     Conjunctiva/sclera: Conjunctivae normal.  Cardiovascular:     Rate and Rhythm: Normal rate and regular rhythm.     Heart sounds: Normal heart sounds.  Pulmonary:     Effort: Pulmonary effort is normal.     Breath sounds: Normal breath sounds.  Abdominal:     General: Bowel sounds are normal.     Palpations: Abdomen is soft.  Musculoskeletal:     Cervical back: Normal range of motion.  Skin:    General: Skin is warm and dry.     Capillary Refill: Capillary refill takes less than 2 seconds.  Neurological:     Mental Status: She is alert and oriented to person, place, and time.  Psychiatric:        Behavior: Behavior normal.      Musculoskeletal Exam: C-spine has good range of motion.  No midline spinal tenderness or SI joint tenderness.  Shoulder joints, elbow joints, wrist joints, MCPs, PIPs, DIPs have good range of motion with no synovitis.  PIP and DIP thickening consistent with osteoarthritis of both hands.  Tenderness over both CMC joints.  Right middle trigger finger noted.  Hip joints have good range of motion with no groin pain.  Knee joints have good range of motion no warmth or effusion.  Ankle joints have good range of motion with no tenderness or joint swelling.  CDAI Exam: CDAI Score: 3.6  Patient Global: 3 mm; Provider Global: 3 mm Swollen: 0 ; Tender: 3  Joint  Exam 03/01/2022      Right  Left  MCP 2   Tender     MCP 3   Tender     MCP 4   Tender        Investigation: No additional findings.  Imaging: No results found.  Recent Labs: Lab Results  Component Value Date   WBC 6.3 02/07/2022   HGB 13.1 02/07/2022   PLT 303 02/07/2022   NA 140 02/07/2022   K 3.7 02/07/2022   CL 105 02/07/2022   CO2 26 02/07/2022   GLUCOSE 100 (H) 02/07/2022   BUN 12 02/07/2022   CREATININE 0.74 02/07/2022   BILITOT 0.8 02/07/2022   ALKPHOS 59 02/07/2022   AST 19 02/07/2022   ALT 13 02/07/2022   PROT 6.9 02/07/2022   ALBUMIN 3.9 02/07/2022   CALCIUM 9.1 02/07/2022   GFRAA >60 01/27/2020   QFTBGOLD Negative 07/24/2016  QFTBGOLDPLUS Negative 06/21/2021    Speciality Comments: PLQ eye exam: 11/11/2021 WNL @ [facility]. Follow up in 1 year.  Orencia 1000 mg IV q 28days started on 09/2016  Prior therapy includes: Simponi Aria (inadequate response)  Procedures:  No procedures performed Allergies: Arava [leflunomide] and Piroxicam   Assessment / Plan:     Visit Diagnoses: Rheumatoid arthritis with rheumatoid factor of multiple sites without organ or systems involvement ([place]): She has no synovitis on examination today.  She has been experiencing increased pain and stiffness in both hands since July 2023.  Her symptoms have progressively been worsening over the past 1 month.  She has not noticed any breakthrough symptoms prior to her Orencia infusions.  Her last Orencia infusion was administered on 02/07/2022.  She remains on Plaquenil 200 mg 1 tablet in the morning and half tablet in the evening.  Most of her discomfort seems to be due to underlying osteoarthritis.  She does have a right middle trigger finger noted on examination today.  Different treatment options were discussed.  She will remain on Orencia and Plaquenil as prescribed.  She was advised to notify us if she develops increased joint pain or joint swelling.  She will follow-up in  the office in 5 months or sooner if needed.  High risk medication use - Orencia IV 1000 mg every 28 days and Plaquenil 200 mg in the morning and 100 mg at bedtime.  Orencia initiated May 2018.  Her most recent Orencia infusion was on 02/07/2022.  Previously had an adequate response to [employer]. Discontinued arava.  CBC and CMP updated on 02/07/22.  Results were reviewed with the patient today in the office.  Patient will continue to have updated lab work with her infusions. TB gold negative 06/21/21.  PLQ eye exam: 11/11/2021 WNL @ [facility]. Follow up in 1 year. Discussed the importance of postponing Orencia infusions if she develops signs or symptoms of an infection and to resume once the infection is completely cleared.  Primary osteoarthritis of both hands - X-rays were consistent with rheumatoid arthritis and osteoarthritis.  Increased narrowing of the fifth PIP joint noted when compared to x-rays from 2020.  She has been experiencing increased pain and stiffness in both hands since July 2023.  On examination today no obvious synovitis was noted. Discussed the importance of joint protection and muscle strengthening.  Trigger finger, right middle finger: Patient has been experiencing intermittent tenderness and locking of the right middle finger.  Conservative treatment options were discussed including the use of a splint or buddy tape to the adjacent finger.  If her symptoms persist or worsen an ultrasound-guided right trigger finger injection can be scheduled in the future.  Primary osteoarthritis of both knees - X-rays in the past showed severe osteoarthritis and severe chondromalacia patella.  Good range of motion of both knee joints on examination today.  No warmth or effusion noted.  Using a cane to assist with ambulation.  Primary osteoarthritis of both feet - X-rays were consistent with rheumatoid arthritis and osteoarthritis overlap.  She has dorsal spurs noted.  No  radiographic progression was noted between x-rays from 2020 to 10/05/21.    Osteopenia of multiple sites - DEXA 11/10/2020 T-score: -1.9, BMD: 0.639 right femoral neck.  No recent falls or fractures.  Uses a cane to assist with ambulation.  Due to update DEXA July 2024.  Encourage patient to continue to calcium and vitamin D supplement daily.  History of vitamin D deficiency: Patient  is taking vitamin D 2000 units daily.  Other medical conditions are listed as follows:  History of hyperlipidemia: Remains on lovastatin.  History of hypertension: Blood pressure was 137/84 today in the office.  History of asthma  History of depression: She remains on Zoloft as prescribed.  Orders: No orders of the defined types were placed in this encounter.  No orders of the defined types were placed in this encounter.     Follow-Up Instructions: Return in about 5 months (around 07/31/2022) for Rheumatoid arthritis, Osteoarthritis.   Gearldine Bienenstock, PA-C  Note - This record has been created using Dragon software.  Chart creation errors have been sought, but may not always  have been located. Such creation errors do not reflect on  the standard of medical care.

## 2022-02-17 ENCOUNTER — Ambulatory Visit (INDEPENDENT_AMBULATORY_CARE_PROVIDER_SITE_OTHER): Payer: Medicare Other | Admitting: Internal Medicine

## 2022-02-17 ENCOUNTER — Encounter: Payer: Self-pay | Admitting: Internal Medicine

## 2022-02-17 DIAGNOSIS — M0579 Rheumatoid arthritis with rheumatoid factor of multiple sites without organ or systems involvement: Secondary | ICD-10-CM

## 2022-02-17 DIAGNOSIS — J4530 Mild persistent asthma, uncomplicated: Secondary | ICD-10-CM

## 2022-02-17 MED ORDER — FLUTICASONE-UMECLIDIN-VILANT 100-62.5-25 MCG/ACT IN AEPB: INHALATION_SPRAY | RESPIRATORY_TRACT | 11 refills | Status: DC

## 2022-02-17 NOTE — Patient Instructions (Signed)
Trelegy refilled  As mentioned- you can ask your primary office or drug store about availability of the senior strength flu shot.  It can be fine to get the regular strength flu vaccine if you can't get the senior strength- there isn't really that much difference.

## 2022-02-22 ENCOUNTER — Encounter: Payer: Self-pay | Admitting: Internal Medicine

## 2022-02-22 NOTE — Assessment & Plan Note (Signed)
We have not been seeing rheumatoid lung disease but we will watch for that on occasional chest x-ray.  She continues to follow with rheumatology.

## 2022-02-22 NOTE — Assessment & Plan Note (Signed)
Moderate persistent uncomplicated.  We are out of Senior strength flu vaccine and she will get it at her primary office. Plan-continue Trelegy and rescue inhaler.  Refills.

## 2022-02-25 DIAGNOSIS — Z23 Encounter for immunization: Secondary | ICD-10-CM | POA: Diagnosis not present

## 2022-03-01 ENCOUNTER — Encounter: Payer: Self-pay | Admitting: Physician Assistant

## 2022-03-01 ENCOUNTER — Ambulatory Visit: Payer: Medicare Other | Attending: Physician Assistant | Admitting: Physician Assistant

## 2022-03-01 VITALS — BP 137/84 | HR 65 | Resp 15 | Ht 62.5 in | Wt 219.4 lb

## 2022-03-01 DIAGNOSIS — Z8709 Personal history of other diseases of the respiratory system: Secondary | ICD-10-CM | POA: Insufficient documentation

## 2022-03-01 DIAGNOSIS — M0579 Rheumatoid arthritis with rheumatoid factor of multiple sites without organ or systems involvement: Secondary | ICD-10-CM | POA: Diagnosis not present

## 2022-03-01 DIAGNOSIS — M8589 Other specified disorders of bone density and structure, multiple sites: Secondary | ICD-10-CM | POA: Insufficient documentation

## 2022-03-01 DIAGNOSIS — Z8659 Personal history of other mental and behavioral disorders: Secondary | ICD-10-CM | POA: Insufficient documentation

## 2022-03-01 DIAGNOSIS — M65331 Trigger finger, right middle finger: Secondary | ICD-10-CM | POA: Diagnosis not present

## 2022-03-01 DIAGNOSIS — M170 Bilateral primary osteoarthritis of knee: Secondary | ICD-10-CM | POA: Diagnosis not present

## 2022-03-01 DIAGNOSIS — Z79899 Other long term (current) drug therapy: Secondary | ICD-10-CM | POA: Diagnosis not present

## 2022-03-01 DIAGNOSIS — Z8639 Personal history of other endocrine, nutritional and metabolic disease: Secondary | ICD-10-CM | POA: Diagnosis not present

## 2022-03-01 DIAGNOSIS — M19071 Primary osteoarthritis, right ankle and foot: Secondary | ICD-10-CM | POA: Insufficient documentation

## 2022-03-01 DIAGNOSIS — M19041 Primary osteoarthritis, right hand: Secondary | ICD-10-CM | POA: Insufficient documentation

## 2022-03-01 DIAGNOSIS — M19072 Primary osteoarthritis, left ankle and foot: Secondary | ICD-10-CM | POA: Diagnosis not present

## 2022-03-01 DIAGNOSIS — M545 Low back pain, unspecified: Secondary | ICD-10-CM

## 2022-03-01 DIAGNOSIS — Z8679 Personal history of other diseases of the circulatory system: Secondary | ICD-10-CM | POA: Diagnosis not present

## 2022-03-01 DIAGNOSIS — M19042 Primary osteoarthritis, left hand: Secondary | ICD-10-CM | POA: Insufficient documentation

## 2022-03-07 ENCOUNTER — Encounter (HOSPITAL_COMMUNITY)
Admission: RE | Admit: 2022-03-07 | Discharge: 2022-03-07 | Disposition: A | Discharge status: Home / Self Care | Payer: Medicare Other | Source: Ambulatory Visit | Attending: Rheumatology | Admitting: Rheumatology

## 2022-03-07 VITALS — BP 122/78 | HR 60 | Temp 98.5°F | Resp 12 | Ht 62.5 in | Wt 218.5 lb

## 2022-03-07 DIAGNOSIS — M0579 Rheumatoid arthritis with rheumatoid factor of multiple sites without organ or systems involvement: Secondary | ICD-10-CM | POA: Diagnosis not present

## 2022-03-07 DIAGNOSIS — Z79899 Other long term (current) drug therapy: Secondary | ICD-10-CM | POA: Diagnosis not present

## 2022-03-07 MED ORDER — ACETAMINOPHEN 325 MG PO TABS: 650.0000 mg | ORAL_TABLET | Freq: Once | ORAL | Status: DC

## 2022-03-07 MED ORDER — DIPHENHYDRAMINE HCL 25 MG PO CAPS: 25.0000 mg | ORAL_CAPSULE | Freq: Once | ORAL | Status: DC

## 2022-03-07 MED ORDER — SODIUM CHLORIDE 0.9 % IV SOLN
1000.0000 mg | INTRAVENOUS | Status: DC
  Administered 2022-03-07: 1000 mg via INTRAVENOUS
  Filled 2022-03-07: qty 40

## 2022-03-07 NOTE — Progress Notes (Signed)
Diagnosis: Rheumatoid Arthritis  Provider:  Bo Merino MD  Procedure: Infusion  IV Type: Peripheral, IV Location: L Antecubital  Orencia (Abatacept), Dose: 1000 mg  Infusion Start Time: 3149  Infusion Stop Time: 7026  Post Infusion IV Care: Patient declined observation and Peripheral IV Discontinued  Discharge: Condition: Stable, Destination: Home . AVS provided to patient.   Performed by:  Binnie Kand, RN

## 2022-03-27 ENCOUNTER — Other Ambulatory Visit: Payer: Self-pay | Admitting: Physician Assistant

## 2022-03-27 DIAGNOSIS — M0579 Rheumatoid arthritis with rheumatoid factor of multiple sites without organ or systems involvement: Secondary | ICD-10-CM

## 2022-03-27 NOTE — Telephone Encounter (Signed)
Next Visit: 07/31/2022  Last Visit: 03/01/2022  Labs: 02/07/2022  CBC and CMP are normal.   Eye exam: 11/11/2021   Current Dose per office note on 03/01/2022: Plaquenil 200 mg in the morning and 100 mg at bedtime.   [id number] arthritis with rheumatoid factor of multiple sites without organ or systems involvement   Last Fill: 12/28/2021  Okay to refill Plaquenil?

## 2022-04-04 ENCOUNTER — Encounter (HOSPITAL_COMMUNITY)
Admission: RE | Admit: 2022-04-04 | Discharge: 2022-04-04 | Disposition: A | Discharge status: Home / Self Care | Payer: Medicare Other | Source: Ambulatory Visit | Attending: Rheumatology | Admitting: Rheumatology

## 2022-04-04 VITALS — BP 133/84 | HR 69 | Temp 99.0°F | Resp 20

## 2022-04-04 DIAGNOSIS — M8589 Other specified disorders of bone density and structure, multiple sites: Secondary | ICD-10-CM | POA: Insufficient documentation

## 2022-04-04 DIAGNOSIS — Z79899 Other long term (current) drug therapy: Secondary | ICD-10-CM | POA: Insufficient documentation

## 2022-04-04 DIAGNOSIS — M0579 Rheumatoid arthritis with rheumatoid factor of multiple sites without organ or systems involvement: Secondary | ICD-10-CM | POA: Diagnosis not present

## 2022-04-04 LAB — CBC WITH DIFFERENTIAL/PLATELET
Abs Immature Granulocytes: 0.03 10*3/uL (ref 0.00–0.07)
Basophils Absolute: 0 10*3/uL (ref 0.0–0.1)
Basophils Relative: 0 %
Eosinophils Absolute: 0.2 10*3/uL (ref 0.0–0.5)
Eosinophils Relative: 3 %
HCT: 39.9 % (ref 36.0–46.0)
Hemoglobin: 13.5 g/dL (ref 12.0–15.0)
Immature Granulocytes: 0 %
Lymphocytes Relative: 22 %
Lymphs Abs: 1.5 10*3/uL (ref 0.7–4.0)
MCH: 32.9 pg (ref 26.0–34.0)
MCHC: 33.8 g/dL (ref 30.0–36.0)
MCV: 97.3 fL (ref 80.0–100.0)
Monocytes Absolute: 0.6 10*3/uL (ref 0.1–1.0)
Monocytes Relative: 9 %
Neutro Abs: 4.5 10*3/uL (ref 1.7–7.7)
Neutrophils Relative %: 66 %
Platelets: 293 10*3/uL (ref 150–400)
RBC: 4.1 MIL/uL (ref 3.87–5.11)
RDW: 12.3 % (ref 11.5–15.5)
WBC: 6.8 10*3/uL (ref 4.0–10.5)
nRBC: 0 % (ref 0.0–0.2)

## 2022-04-04 LAB — COMPREHENSIVE METABOLIC PANEL
ALT: 12 U/L (ref 0–44)
AST: 19 U/L (ref 15–41)
Albumin: 3.9 g/dL (ref 3.5–5.0)
Alkaline Phosphatase: 61 U/L (ref 38–126)
Anion gap: 8 (ref 5–15)
BUN: 18 mg/dL (ref 8–23)
CO2: 26 mmol/L (ref 22–32)
Calcium: 8.9 mg/dL (ref 8.9–10.3)
Chloride: 104 mmol/L (ref 98–111)
Creatinine, Ser: 0.77 mg/dL (ref 0.44–1.00)
GFR, Estimated: >60 mL/min (ref >60)
Glucose, Bld: 94 mg/dL (ref 70–99)
Potassium: 3.5 mmol/L (ref 3.5–5.1)
Sodium: 138 mmol/L (ref 135–145)
Total Bilirubin: 0.7 mg/dL (ref 0.3–1.2)
Total Protein: 6.8 g/dL (ref 6.5–8.1)

## 2022-04-04 MED ORDER — DIPHENHYDRAMINE HCL 25 MG PO CAPS: 25.0000 mg | ORAL_CAPSULE | Freq: Once | ORAL | Status: DC

## 2022-04-04 MED ORDER — ACETAMINOPHEN 325 MG PO TABS: 650.0000 mg | ORAL_TABLET | Freq: Once | ORAL | Status: DC

## 2022-04-04 MED ORDER — SODIUM CHLORIDE 0.9 % IV SOLN
1000.0000 mg | INTRAVENOUS | Status: DC
  Administered 2022-04-04: 1000 mg via INTRAVENOUS
  Filled 2022-04-04: qty 40

## 2022-04-04 NOTE — Progress Notes (Signed)
Diagnosis: Rheumatoid Arthritis  Provider:  Pollyann Savoy MD  Procedure: Infusion  IV Type: Peripheral, IV Location: L Antecubital  Orencia (Abatacept), Dose: 1000 mg     Post Infusion IV Care: Observation period completed and Peripheral IV Discontinued  Discharge: Condition: Good, Destination: Home . AVS provided to patient.   Performed by:  Enzo Montgomery, RN

## 2022-04-04 NOTE — Progress Notes (Signed)
CBC and CMP are normal.

## 2022-04-27 DIAGNOSIS — F419 Anxiety disorder, unspecified: Secondary | ICD-10-CM | POA: Diagnosis not present

## 2022-04-27 DIAGNOSIS — E7800 Pure hypercholesterolemia, unspecified: Secondary | ICD-10-CM | POA: Diagnosis not present

## 2022-04-27 DIAGNOSIS — F3341 Major depressive disorder, recurrent, in partial remission: Secondary | ICD-10-CM | POA: Diagnosis not present

## 2022-04-27 DIAGNOSIS — Z6838 Body mass index (BMI) 38.0-38.9, adult: Secondary | ICD-10-CM | POA: Diagnosis not present

## 2022-04-27 DIAGNOSIS — I10 Essential (primary) hypertension: Secondary | ICD-10-CM | POA: Diagnosis not present

## 2022-05-02 ENCOUNTER — Encounter (HOSPITAL_COMMUNITY)
Admission: RE | Admit: 2022-05-02 | Discharge: 2022-05-02 | Disposition: A | Discharge status: Home / Self Care | Payer: Medicare Other | Source: Ambulatory Visit | Attending: Rheumatology | Admitting: Rheumatology

## 2022-05-02 VITALS — BP 132/73 | HR 65 | Temp 97.7°F | Resp 18 | Ht 62.5 in | Wt 216.0 lb

## 2022-05-02 DIAGNOSIS — Z01818 Encounter for other preprocedural examination: Secondary | ICD-10-CM | POA: Insufficient documentation

## 2022-05-02 DIAGNOSIS — M0579 Rheumatoid arthritis with rheumatoid factor of multiple sites without organ or systems involvement: Secondary | ICD-10-CM | POA: Insufficient documentation

## 2022-05-02 DIAGNOSIS — Z79899 Other long term (current) drug therapy: Secondary | ICD-10-CM | POA: Insufficient documentation

## 2022-05-02 MED ORDER — SODIUM CHLORIDE 0.9 % IV SOLN
1000.0000 mg | INTRAVENOUS | Status: DC
  Administered 2022-05-02: 1000 mg via INTRAVENOUS
  Filled 2022-05-02: qty 40

## 2022-05-02 MED ORDER — ACETAMINOPHEN 325 MG PO TABS: 650.0000 mg | ORAL_TABLET | Freq: Once | ORAL | Status: DC

## 2022-05-02 MED ORDER — DIPHENHYDRAMINE HCL 25 MG PO CAPS: 25.0000 mg | ORAL_CAPSULE | Freq: Once | ORAL | Status: DC

## 2022-05-02 NOTE — Addendum Note (Signed)
Encounter addended by: Tarus Briski E, RN on: 05/02/2022 12:01 PM  Actions taken: Therapy plan modified

## 2022-05-02 NOTE — Progress Notes (Signed)
Diagnosis: Rheumatoid Arthritis  Provider:  Pollyann Savoy MD  Procedure: Infusion  IV Type: Peripheral, IV Location: L Antecubital  Orencia (Abatacept), Dose: 1000 mg  Infusion Start Time: 0955  Infusion Stop Time: 1029  Post Infusion IV Care: Patient declined observation  Discharge: Condition: Good, Destination: Home . AVS provided to patient.   Performed by:  Jasper Riling, RN

## 2022-05-03 ENCOUNTER — Telehealth: Payer: Self-pay | Admitting: Pharmacist

## 2022-05-03 NOTE — Telephone Encounter (Signed)
ATC patient regarding [employer] benefits reverification of benefits. Left VM requesting return call   Next infusion: 05/30/2022 Previous coverage: Medicare + BCBS PLAN F supplemental plan   Chesley Mires, PharmD, MPH, BCPS, CPP Clinical Pharmacist (Rheumatology and Pulmonology)

## 2022-05-04 NOTE — Telephone Encounter (Signed)
Patient returned call and stated that she will not have changes to insurance in 2024.   BCBS Plan F supplemental covers Part A and Part B deductible/coinsurance. Patient is active as of 05/09/2007 and terminates on 05/07/22. Pre-certification is not required for Medicare patients. Medicare covers 80% of the infusion and no authorization is required, and the patient's BCBS[place] supplement would cover the 20% of the cost that was not paid for by Medicare as long as Medicare covered the medication.   Call ref # Leafy Half 05/04/2022  BCBS Phone: [phone]  Patient will have new plan on 05/08/2022. Will have to call plan again after new year to reverify  Chesley Mires, PharmD, MPH, BCPS, CPP Clinical Pharmacist (Rheumatology and Pulmonology)

## 2022-05-18 NOTE — Telephone Encounter (Signed)
Called patient for new [place] information.  BCBS Medicare Advantage plan: ID: [id number] Customer service # [phone]  Plan F supplement: ID: [id number] Phone: [phone]  Please reverify benefits for [place] through new plans. Her next infusion is 05/30/2022  Knox Saliva, PharmD, MPH, BCPS, CPP Clinical Pharmacist (Rheumatology and Pulmonology)

## 2022-05-22 NOTE — Telephone Encounter (Signed)
Reverified patient's benefits for Orencia: Patient still has active traditional Medicare A&B, and BCBS Supplement plan F.  Patient had a BCBS part D prescription plan.   Medicare covers 80% of the infusion and no authorization is required, and the patient's [place] would cover the 20% of the cost that was not paid for by Medicare as long as Medicare covered the medication. The Supplement also covers the patient's Medicare deductible

## 2022-05-23 ENCOUNTER — Encounter: Payer: Self-pay | Admitting: Rheumatology

## 2022-05-29 ENCOUNTER — Other Ambulatory Visit: Payer: Self-pay | Admitting: Rheumatology

## 2022-05-29 NOTE — Telephone Encounter (Signed)
Next Visit: 07/31/2022   Last Visit: 03/01/2022  Dx: Primary osteoarthritis of both knees   Current Dose per office note on 03/01/2022: not discussed  Last Fill: 11/16/2020  Okay to refill Voltaren Gel?

## 2022-05-30 ENCOUNTER — Encounter (HOSPITAL_COMMUNITY)
Admission: RE | Admit: 2022-05-30 | Discharge: 2022-05-30 | Disposition: A | Discharge status: Home / Self Care | Payer: Medicare Other | Source: Ambulatory Visit | Attending: Rheumatology | Admitting: Rheumatology

## 2022-05-30 VITALS — BP 165/67 | HR 60 | Temp 98.2°F | Resp 16 | Ht 62.5 in | Wt 216.0 lb

## 2022-05-30 DIAGNOSIS — M0579 Rheumatoid arthritis with rheumatoid factor of multiple sites without organ or systems involvement: Secondary | ICD-10-CM | POA: Diagnosis not present

## 2022-05-30 DIAGNOSIS — Z79899 Other long term (current) drug therapy: Secondary | ICD-10-CM | POA: Insufficient documentation

## 2022-05-30 LAB — CBC WITH DIFFERENTIAL/PLATELET
Abs Immature Granulocytes: 0.01 10*3/uL (ref 0.00–0.07)
Basophils Absolute: 0 10*3/uL (ref 0.0–0.1)
Basophils Relative: 1 %
Eosinophils Absolute: 0.1 10*3/uL (ref 0.0–0.5)
Eosinophils Relative: 2 %
HCT: 39.3 % (ref 36.0–46.0)
Hemoglobin: 13.1 g/dL (ref 12.0–15.0)
Immature Granulocytes: 0 %
Lymphocytes Relative: 25 %
Lymphs Abs: 1.5 10*3/uL (ref 0.7–4.0)
MCH: 33 pg (ref 26.0–34.0)
MCHC: 33.3 g/dL (ref 30.0–36.0)
MCV: 99 fL (ref 80.0–100.0)
Monocytes Absolute: 0.5 10*3/uL (ref 0.1–1.0)
Monocytes Relative: 9 %
Neutro Abs: 3.8 10*3/uL (ref 1.7–7.7)
Neutrophils Relative %: 63 %
Platelets: 278 10*3/uL (ref 150–400)
RBC: 3.97 MIL/uL (ref 3.87–5.11)
RDW: 12.7 % (ref 11.5–15.5)
WBC: 6 10*3/uL (ref 4.0–10.5)
nRBC: 0 % (ref 0.0–0.2)

## 2022-05-30 LAB — COMPREHENSIVE METABOLIC PANEL
ALT: 11 U/L (ref 0–44)
AST: 19 U/L (ref 15–41)
Albumin: 3.8 g/dL (ref 3.5–5.0)
Alkaline Phosphatase: 59 U/L (ref 38–126)
Anion gap: 11 (ref 5–15)
BUN: 15 mg/dL (ref 8–23)
CO2: 24 mmol/L (ref 22–32)
Calcium: 8.8 mg/dL — ABNORMAL LOW (ref 8.9–10.3)
Chloride: 103 mmol/L (ref 98–111)
Creatinine, Ser: 0.76 mg/dL (ref 0.44–1.00)
GFR, Estimated: >60 mL/min (ref >60)
Glucose, Bld: 91 mg/dL (ref 70–99)
Potassium: 3.7 mmol/L (ref 3.5–5.1)
Sodium: 138 mmol/L (ref 135–145)
Total Bilirubin: 0.8 mg/dL (ref 0.3–1.2)
Total Protein: 6.9 g/dL (ref 6.5–8.1)

## 2022-05-30 MED ORDER — DIPHENHYDRAMINE HCL 25 MG PO CAPS: 25.0000 mg | ORAL_CAPSULE | Freq: Once | ORAL | Status: DC

## 2022-05-30 MED ORDER — SODIUM CHLORIDE 0.9 % IV SOLN
1000.0000 mg | INTRAVENOUS | Status: DC
  Administered 2022-05-30: 1000 mg via INTRAVENOUS
  Filled 2022-05-30: qty 40

## 2022-05-30 MED ORDER — ACETAMINOPHEN 325 MG PO TABS: 650.0000 mg | ORAL_TABLET | Freq: Once | ORAL | Status: DC

## 2022-05-30 NOTE — Addendum Note (Signed)
Encounter addended by: Baxter Hire, RN on: 05/30/2022 10:34 AM  Actions taken: Therapy plan modified

## 2022-05-30 NOTE — Progress Notes (Signed)
CBC is normal.  CMP shows low calcium.  Patient should take calcium supplement.

## 2022-05-30 NOTE — Progress Notes (Signed)
Diagnosis: Rheumatoid Arthritis  Provider:  Bo Merino MD  Procedure: Infusion  IV Type: Peripheral, IV Location: L Antecubital  Orencia (Abatacept), Dose: 1000 mg  Infusion Start Time: 2355  Infusion Stop Time: 7322  Post Infusion IV Care: Patient declined observation  Discharge: Condition: Good, Destination: Home . AVS provided to patient.   Performed by:  Hughie Closs, RN

## 2022-06-05 LAB — QUANTIFERON-TB GOLD PLUS (RQFGPL)
QuantiFERON Mitogen Value: >10 IU/mL
QuantiFERON Nil Value: 0 IU/mL
QuantiFERON TB1 Ag Value: 0.02 IU/mL
QuantiFERON TB2 Ag Value: 0.01 IU/mL

## 2022-06-05 LAB — QUANTIFERON-TB GOLD PLUS: QuantiFERON-TB Gold Plus: NEGATIVE

## 2022-06-06 ENCOUNTER — Other Ambulatory Visit: Payer: Self-pay

## 2022-06-06 DIAGNOSIS — M8589 Other specified disorders of bone density and structure, multiple sites: Secondary | ICD-10-CM

## 2022-06-06 NOTE — Progress Notes (Signed)
Patient states she has scheduled a DEXA scan at [facility] on 10/11/2022 and is requesting that an order is sent. Please review and sign pended DEXA order. Thanks!

## 2022-06-27 ENCOUNTER — Encounter: Payer: Self-pay | Admitting: Rheumatology

## 2022-06-27 ENCOUNTER — Encounter (HOSPITAL_COMMUNITY)
Admission: RE | Admit: 2022-06-27 | Discharge: 2022-06-27 | Disposition: A | Discharge status: Home / Self Care | Payer: Medicare Other | Source: Ambulatory Visit | Attending: Rheumatology | Admitting: Rheumatology

## 2022-06-27 VITALS — BP 139/71 | HR 72 | Temp 97.5°F | Resp 14

## 2022-06-27 DIAGNOSIS — M0579 Rheumatoid arthritis with rheumatoid factor of multiple sites without organ or systems involvement: Secondary | ICD-10-CM | POA: Insufficient documentation

## 2022-06-27 DIAGNOSIS — Z79899 Other long term (current) drug therapy: Secondary | ICD-10-CM | POA: Insufficient documentation

## 2022-06-27 MED ORDER — ACETAMINOPHEN 325 MG PO TABS: 650.0000 mg | ORAL_TABLET | Freq: Once | ORAL | Status: DC

## 2022-06-27 MED ORDER — SODIUM CHLORIDE 0.9 % IV SOLN
1000.0000 mg | INTRAVENOUS | Status: DC
  Administered 2022-06-27: 1000 mg via INTRAVENOUS
  Filled 2022-06-27: qty 40

## 2022-06-27 MED ORDER — DIPHENHYDRAMINE HCL 25 MG PO CAPS: 25.0000 mg | ORAL_CAPSULE | Freq: Once | ORAL | Status: DC

## 2022-06-27 NOTE — Progress Notes (Signed)
Diagnosis: Rheumatoid Arthritis  Provider:  Bo Merino MD  Procedure: Infusion  IV Type: Peripheral, IV Location: R Forearm  Orencia (Abatacept), Dose: 1000 mg  Infusion Start Time: [id number]  Infusion Stop Time: [id number]  Post Infusion IV Care: Patient declined observation and Peripheral IV Discontinued  Discharge: Condition: Stable, Destination: Home . AVS Declined  Performed by:  Binnie Kand, RN

## 2022-07-10 ENCOUNTER — Other Ambulatory Visit: Payer: Self-pay | Admitting: Physician Assistant

## 2022-07-10 DIAGNOSIS — M0579 Rheumatoid arthritis with rheumatoid factor of multiple sites without organ or systems involvement: Secondary | ICD-10-CM

## 2022-07-10 NOTE — Telephone Encounter (Signed)
Next Visit: 08/24/2022  Last Visit: 03/01/2022  Labs: 05/30/2022 CBC is normal.  CMP shows low calcium.     Eye exam:  11/11/2021 WNL    Current Dose per office note 03/01/2022:  Plaquenil 200 mg in the morning and 100 mg at bedtime.    [id number] arthritis with rheumatoid factor of multiple sites without organ or systems involvement    Last Fill: 03/28/2022  Okay to refill Plaquenil?

## 2022-07-25 ENCOUNTER — Encounter (HOSPITAL_COMMUNITY)
Admission: RE | Admit: 2022-07-25 | Discharge: 2022-07-25 | Disposition: A | Discharge status: Home / Self Care | Payer: Medicare Other | Source: Ambulatory Visit | Attending: Rheumatology | Admitting: Rheumatology

## 2022-07-25 VITALS — BP 143/72 | HR 59 | Temp 98.2°F | Resp 16

## 2022-07-25 DIAGNOSIS — Z79899 Other long term (current) drug therapy: Secondary | ICD-10-CM | POA: Diagnosis not present

## 2022-07-25 DIAGNOSIS — M0579 Rheumatoid arthritis with rheumatoid factor of multiple sites without organ or systems involvement: Secondary | ICD-10-CM | POA: Diagnosis not present

## 2022-07-25 LAB — CBC WITH DIFFERENTIAL/PLATELET
Abs Immature Granulocytes: 0.02 10*3/uL (ref 0.00–0.07)
Basophils Absolute: 0 10*3/uL (ref 0.0–0.1)
Basophils Relative: 1 %
Eosinophils Absolute: 0.2 10*3/uL (ref 0.0–0.5)
Eosinophils Relative: 2 %
HCT: 37.3 % (ref 36.0–46.0)
Hemoglobin: 12.6 g/dL (ref 12.0–15.0)
Immature Granulocytes: 0 %
Lymphocytes Relative: 26 %
Lymphs Abs: 1.6 10*3/uL (ref 0.7–4.0)
MCH: 32.9 pg (ref 26.0–34.0)
MCHC: 33.8 g/dL (ref 30.0–36.0)
MCV: 97.4 fL (ref 80.0–100.0)
Monocytes Absolute: 0.5 10*3/uL (ref 0.1–1.0)
Monocytes Relative: 8 %
Neutro Abs: 3.9 10*3/uL (ref 1.7–7.7)
Neutrophils Relative %: 63 %
Platelets: 266 10*3/uL (ref 150–400)
RBC: 3.83 MIL/uL — ABNORMAL LOW (ref 3.87–5.11)
RDW: 12.5 % (ref 11.5–15.5)
WBC: 6.2 10*3/uL (ref 4.0–10.5)
nRBC: 0 % (ref 0.0–0.2)

## 2022-07-25 LAB — COMPREHENSIVE METABOLIC PANEL
ALT: 10 U/L (ref 0–44)
AST: 17 U/L (ref 15–41)
Albumin: 3.7 g/dL (ref 3.5–5.0)
Alkaline Phosphatase: 61 U/L (ref 38–126)
Anion gap: 10 (ref 5–15)
BUN: 17 mg/dL (ref 8–23)
CO2: 23 mmol/L (ref 22–32)
Calcium: 8.7 mg/dL — ABNORMAL LOW (ref 8.9–10.3)
Chloride: 103 mmol/L (ref 98–111)
Creatinine, Ser: 0.76 mg/dL (ref 0.44–1.00)
GFR, Estimated: >60 mL/min (ref >60)
Glucose, Bld: 104 mg/dL — ABNORMAL HIGH (ref 70–99)
Potassium: 3.3 mmol/L — ABNORMAL LOW (ref 3.5–5.1)
Sodium: 136 mmol/L (ref 135–145)
Total Bilirubin: 0.9 mg/dL (ref 0.3–1.2)
Total Protein: 6.7 g/dL (ref 6.5–8.1)

## 2022-07-25 MED ORDER — ACETAMINOPHEN 325 MG PO TABS: 650.0000 mg | ORAL_TABLET | Freq: Once | ORAL | Status: DC

## 2022-07-25 MED ORDER — DIPHENHYDRAMINE HCL 25 MG PO CAPS: 25.0000 mg | ORAL_CAPSULE | Freq: Once | ORAL | Status: DC

## 2022-07-25 MED ORDER — SODIUM CHLORIDE 0.9 % IV SOLN
1000.0000 mg | INTRAVENOUS | Status: DC
  Administered 2022-07-25: 1000 mg via INTRAVENOUS
  Filled 2022-07-25: qty 40

## 2022-07-25 NOTE — Progress Notes (Signed)
Diagnosis: Rheumatoid Arthritis  Provider:  Bo Merino MD  Procedure: Infusion  IV Type: Peripheral, IV Location: L Antecubital  Orencia (Abatacept), Dose: 1000 mg  Infusion Start Time: 1000  Infusion Stop Time: [id number]  Post Infusion IV Care: Patient declined observation and Peripheral IV Discontinued  Discharge: Condition: Stable, Destination: Home . AVS Provided  Performed by:  Binnie Kand, RN

## 2022-07-31 ENCOUNTER — Ambulatory Visit: Payer: Medicare Other | Admitting: Physician Assistant

## 2022-08-11 NOTE — Progress Notes (Signed)
Office Visit Note  Patient: Monica [name]             Date of Birth: Apr 24, 1941           MRN: [id number]             PCP: Soundra Pilon, FNP Referring: Soundra Pilon, FNP Visit Date: 08/24/2022 Occupation: @GUAROCC @  Subjective:  Increased pain and fatigue  History of Present Illness: Monica [name] is a 82 y.o. female history of seropositive rheumatoid arthritis, osteoarthritis and osteopenia.  She states she has been experiencing increased fatigue and decreased grip strength in her hands.  She states she drops objects easily.  She has been getting Orencia IV infusions 1000 mg every 4 weeks.  She is also taking hydroxychloroquine 200 mg in the morning and 100 mg in the evening on a regular basis.  He had no interruption in the treatment since the last visit.  She continues to have pain and discomfort in her knee joints.  She ambulates with the help of a cane.    Activities of Daily Living:  Patient reports morning stiffness for a few hours.   Patient Reports nocturnal pain.  Difficulty dressing/grooming: Denies Difficulty climbing stairs: Reports Difficulty getting out of chair: Reports Difficulty using hands for taps, buttons, cutlery, and/or writing: Denies  Review of Systems  Constitutional:  Positive for fatigue.  HENT:  Positive for mouth dryness. Negative for mouth sores.   Eyes:  Positive for dryness.  Respiratory:  Positive for shortness of breath.   Cardiovascular:  Negative for chest pain and palpitations.  Gastrointestinal:  Positive for constipation. Negative for blood in stool and diarrhea.  Endocrine: Negative for increased urination.  Genitourinary:  Negative for involuntary urination.  Musculoskeletal:  Positive for joint pain, joint pain, joint swelling, myalgias, morning stiffness, muscle tenderness and myalgias. Negative for muscle weakness.  Skin:  Negative for color change, rash, hair loss and sensitivity to sunlight.  Allergic/Immunologic:  Negative for susceptible to infections.  Neurological:  Negative for dizziness and headaches.  Hematological:  Negative for swollen glands.  Psychiatric/Behavioral:  Positive for depressed mood and sleep disturbance. The patient is not nervous/anxious.     PMFS History:  Patient Active Problem List   Diagnosis Date Noted   Asthmatic bronchitis 02/16/2019   Osteopenia 12/20/2016   History of depression 12/11/2016   High risk medication use 05/15/2016   Primary osteoarthritis of both hands 05/15/2016   Primary osteoarthritis of both feet 05/15/2016   Primary osteoarthritis of both knees 05/15/2016   Vitamin D deficiency 05/15/2016   History of hypertension 05/15/2016   History of hyperlipidemia 05/15/2016   Rheumatoid arthritis with rheumatoid factor of multiple sites without organ or systems involvement 01/10/2016   Seasonal allergic rhinitis 10/03/2011   HYPERLIPIDEMIA 12/31/2008   HYPERTENSION 12/31/2008   Mild persistent asthmatic bronchitis with exacerbation 12/31/2008    Past Medical History:  Diagnosis Date   Arthritis    Asthma    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, FEV1 119%   Hyperlipidemia    Hypertension     Family History  Problem Relation Age of Onset   Lung cancer Father    Alzheimer's disease Mother    Past Surgical History:  Procedure Laterality Date   CARPAL TUNNEL RELEASE     FOOT SURGERY     MOUTH SURGERY Right 05/13/2020   Social History   Social History Narrative   Not on file   Immunization History  Administered Date(s)  Administered   Fluad Quad(high Dose 65+) 02/13/2019, 02/18/2020, 02/17/2021   Influenza Nasal 02/29/2012   Influenza Split 03/02/2011   Influenza, High Dose Seasonal PF 02/12/2017, 02/12/2018   Influenza,inj,Quad PF,6+ Mos 02/25/2013, 02/23/2014   Influenza-Unspecified 03/15/2015   Moderna Sars-Covid-2 Vaccination 07/16/2019, 08/13/2019, 08/06/2020   Pneumococcal Conjugate-13 02/25/2013   Zoster Recombinat (Shingrix)  09/27/2021     Objective: Vital Signs: BP 110/71 (BP Location: Left Arm, Patient Position: Sitting, Cuff Size: Large)   Pulse 86   Resp 15   Ht 5' 2.5" (1.588 m)   Wt 208 lb 6.4 oz (94.5 kg)   BMI 37.51 kg/m    Physical Exam Vitals and nursing note reviewed.  Constitutional:      Appearance: She is well-developed.  HENT:     Head: Normocephalic and atraumatic.  Eyes:     Conjunctiva/sclera: Conjunctivae normal.  Cardiovascular:     Rate and Rhythm: Normal rate and regular rhythm.     Heart sounds: Normal heart sounds.  Pulmonary:     Effort: Pulmonary effort is normal.     Breath sounds: Normal breath sounds.  Abdominal:     General: Bowel sounds are normal.     Palpations: Abdomen is soft.  Musculoskeletal:     Cervical back: Normal range of motion.  Lymphadenopathy:     Cervical: No cervical adenopathy.  Skin:    General: Skin is warm and dry.     Capillary Refill: Capillary refill takes less than 2 seconds.  Neurological:     Mental Status: She is alert and oriented to person, place, and time.  Psychiatric:        Behavior: Behavior normal.      Musculoskeletal Exam: cervical spine was in good range of motion.  She had some tenderness over the mid thoracic region.  Shoulder joints, elbow joints, wrist joints, MCPs PIPs and DIPs Juengel range of motion with no synovitis.  Bilateral CMC PIP and DIP thickening was noted.  Right middle trigger finger was noted.  Hip joints and knee joints were in good range of motion.  There was no tenderness over ankles or MTPs.  CDAI Exam: CDAI Score: 2.6  Patient Global: 3 mm; Provider Global: 3 mm Swollen: 0 ; Tender: 2  Joint Exam 08/24/2022      Right  Left  Knee   Tender   Tender     Investigation: No additional findings.  Imaging: No results found.  Recent Labs: Lab Results  Component Value Date   WBC 6.2 07/25/2022   HGB 12.6 07/25/2022   PLT 266 07/25/2022   NA 136 07/25/2022   K 3.3 (L) 07/25/2022   CL  103 07/25/2022   CO2 23 07/25/2022   GLUCOSE 104 (H) 07/25/2022   BUN 17 07/25/2022   CREATININE 0.76 07/25/2022   BILITOT 0.9 07/25/2022   ALKPHOS 61 07/25/2022   AST 17 07/25/2022   ALT 10 07/25/2022   PROT 6.7 07/25/2022   ALBUMIN 3.7 07/25/2022   CALCIUM 8.7 (L) 07/25/2022   GFRAA >60 01/27/2020   QFTBGOLD Negative 07/24/2016   QFTBGOLDPLUS Negative 05/30/2022    Speciality Comments: PLQ eye exam: 11/11/2021 WNL @ [facility]. Follow up in 1 year.  Orencia 1000 mg IV q 28days started on 09/2016  Prior therapy includes: Simponi Aria (inadequate response)  Procedures:  No procedures performed Allergies: Arava [leflunomide] and Piroxicam   Assessment / Plan:     Visit Diagnoses: Rheumatoid arthritis with rheumatoid factor of multiple sites without organ  or systems involvement-she has been experiencing increased pain and discomfort.  She states she has noticed decreased grip strength in her hands.  She also feels depressed and not being very active.  She complains of discomfort in her bilateral knee joints and lower back.  No synovitis was noted on the examination today.  She is on Orencia IV every 28 days without any interruption.  She also stays on hydroxychloroquine.  High risk medication use - Orencia IV 1000 mg every 28 days (started May 2018), Plaquenil 200 mg in the morning and 100 mg at bedtime.  She had an inadequate- Simponi Aria. Discontinued arava.  Labs obtained on July 25, 2022 CBC and CMP were normal except calcium was low at 8.7.  She was advised to get labs with her infusions.  Information regarding immunization was placed in the AVS.  She was advised to hold Orencia if she develops an infection resume after the infection resolves.  Primary osteoarthritis of both hands-she has osteoarthritis in her hands.  Joint protection muscle strengthening was advised.  Hand muscle strengthening exercises were discussed in the office.  Trigger finger, right middle  finger-patient declined cortisone injection.  Primary osteoarthritis of both knees-she continues to pain and discomfort in her knee joints.  She ambulates with the help of a cane.  Lower extremity muscle strengthening exercises were demonstrated and a handout was given.  Primary osteoarthritis of both feet-proper fitting shoes were advised.  Osteopenia of multiple sites - DEXA 11/10/2020 T-score: -1.9, BMD: 0.639 right femoral neck.  No recent falls or fractures.  Calcium rich diet and vitamin D was advised.  Vitamin D deficiency -we will check vitamin D with her next infusion.  Plan: VITAMIN D 25 Hydroxy (Vit-D Deficiency, Fractures)  History of hyperlipidemia  History of hypertension-blood pressure was normal at 110/71.  History of asthma  History of depression-patient has situational depression.  She states she feels lonely.  I advised her to be more socially involved and may join a gym.  Orders: Orders Placed This Encounter  Procedures   VITAMIN D 25 Hydroxy (Vit-D Deficiency, Fractures)   No orders of the defined types were placed in this encounter.  Face-to-face time spent patient was 55 minutes.  More than 50% time was spent in counseling and coordination of care.  Follow-Up Instructions: Return in about 5 months (around 01/24/2023) for Rheumatoid arthritis.   Monica SavoyShaili Tripp Goins, MD  Note - This record has been created using Animal nutritionistDragon software.  Chart creation errors have been sought, but may not always  have been located. Such creation errors do not reflect on  the standard of medical care.

## 2022-08-22 ENCOUNTER — Encounter (HOSPITAL_COMMUNITY)
Admission: RE | Admit: 2022-08-22 | Discharge: 2022-08-22 | Disposition: A | Discharge status: Home / Self Care | Payer: Medicare Other | Source: Ambulatory Visit | Attending: Rheumatology | Admitting: Rheumatology

## 2022-08-22 VITALS — BP 123/82 | HR 70 | Temp 98.2°F | Resp 18

## 2022-08-22 DIAGNOSIS — Z8709 Personal history of other diseases of the respiratory system: Secondary | ICD-10-CM | POA: Insufficient documentation

## 2022-08-22 DIAGNOSIS — M19072 Primary osteoarthritis, left ankle and foot: Secondary | ICD-10-CM | POA: Insufficient documentation

## 2022-08-22 DIAGNOSIS — M19071 Primary osteoarthritis, right ankle and foot: Secondary | ICD-10-CM | POA: Insufficient documentation

## 2022-08-22 DIAGNOSIS — Z8639 Personal history of other endocrine, nutritional and metabolic disease: Secondary | ICD-10-CM | POA: Diagnosis not present

## 2022-08-22 DIAGNOSIS — Z8659 Personal history of other mental and behavioral disorders: Secondary | ICD-10-CM | POA: Diagnosis not present

## 2022-08-22 DIAGNOSIS — Z8679 Personal history of other diseases of the circulatory system: Secondary | ICD-10-CM | POA: Diagnosis not present

## 2022-08-22 DIAGNOSIS — M65331 Trigger finger, right middle finger: Secondary | ICD-10-CM | POA: Diagnosis not present

## 2022-08-22 DIAGNOSIS — E559 Vitamin D deficiency, unspecified: Secondary | ICD-10-CM | POA: Insufficient documentation

## 2022-08-22 DIAGNOSIS — M0579 Rheumatoid arthritis with rheumatoid factor of multiple sites without organ or systems involvement: Secondary | ICD-10-CM | POA: Insufficient documentation

## 2022-08-22 DIAGNOSIS — M8589 Other specified disorders of bone density and structure, multiple sites: Secondary | ICD-10-CM | POA: Diagnosis not present

## 2022-08-22 DIAGNOSIS — M170 Bilateral primary osteoarthritis of knee: Secondary | ICD-10-CM | POA: Diagnosis not present

## 2022-08-22 DIAGNOSIS — Z79899 Other long term (current) drug therapy: Secondary | ICD-10-CM

## 2022-08-22 DIAGNOSIS — M19041 Primary osteoarthritis, right hand: Secondary | ICD-10-CM | POA: Diagnosis not present

## 2022-08-22 DIAGNOSIS — M19042 Primary osteoarthritis, left hand: Secondary | ICD-10-CM | POA: Diagnosis not present

## 2022-08-22 MED ORDER — ACETAMINOPHEN 325 MG PO TABS: 650.0000 mg | ORAL_TABLET | Freq: Once | ORAL | Status: DC

## 2022-08-22 MED ORDER — SODIUM CHLORIDE 0.9 % IV SOLN
1000.0000 mg | INTRAVENOUS | Status: DC
  Administered 2022-08-22: 1000 mg via INTRAVENOUS
  Filled 2022-08-22: qty 40

## 2022-08-22 MED ORDER — DIPHENHYDRAMINE HCL 25 MG PO CAPS: 25.0000 mg | ORAL_CAPSULE | Freq: Once | ORAL | Status: DC

## 2022-08-22 NOTE — Progress Notes (Signed)
Diagnosis: Rheumatoid Arthritis  Provider:  Pollyann Savoy MD  Procedure: Infusion  IV Type: Peripheral, IV Location: L Antecubital  Orencia (Abatacept), Dose: 1000 mg  Infusion Start Time: 1024  Infusion Stop Time: 1056  Post Infusion IV Care: Observation period completed and Peripheral IV Discontinued  Discharge: Condition: Good, Destination: Home . AVS Provided  Performed by:  Enzo Montgomery, RN

## 2022-08-22 NOTE — Addendum Note (Signed)
Encounter addended by: Wyvonne Lenz, RN on: 08/22/2022 11:33 AM  Actions taken: Therapy plan modified

## 2022-08-24 ENCOUNTER — Encounter: Payer: Self-pay | Admitting: Rheumatology

## 2022-08-24 ENCOUNTER — Encounter (INDEPENDENT_AMBULATORY_CARE_PROVIDER_SITE_OTHER): Payer: Medicare Other | Admitting: Rheumatology

## 2022-08-24 VITALS — BP 110/71 | HR 86 | Resp 15 | Ht 62.5 in | Wt 208.4 lb

## 2022-08-24 DIAGNOSIS — M65331 Trigger finger, right middle finger: Secondary | ICD-10-CM

## 2022-08-24 DIAGNOSIS — M19042 Primary osteoarthritis, left hand: Secondary | ICD-10-CM | POA: Diagnosis not present

## 2022-08-24 DIAGNOSIS — Z8639 Personal history of other endocrine, nutritional and metabolic disease: Secondary | ICD-10-CM

## 2022-08-24 DIAGNOSIS — Z79899 Other long term (current) drug therapy: Secondary | ICD-10-CM | POA: Diagnosis not present

## 2022-08-24 DIAGNOSIS — Z8659 Personal history of other mental and behavioral disorders: Secondary | ICD-10-CM

## 2022-08-24 DIAGNOSIS — Z8679 Personal history of other diseases of the circulatory system: Secondary | ICD-10-CM | POA: Diagnosis not present

## 2022-08-24 DIAGNOSIS — M19071 Primary osteoarthritis, right ankle and foot: Secondary | ICD-10-CM | POA: Diagnosis not present

## 2022-08-24 DIAGNOSIS — Z8709 Personal history of other diseases of the respiratory system: Secondary | ICD-10-CM

## 2022-08-24 DIAGNOSIS — M170 Bilateral primary osteoarthritis of knee: Secondary | ICD-10-CM

## 2022-08-24 DIAGNOSIS — E559 Vitamin D deficiency, unspecified: Secondary | ICD-10-CM

## 2022-08-24 DIAGNOSIS — M19041 Primary osteoarthritis, right hand: Secondary | ICD-10-CM

## 2022-08-24 DIAGNOSIS — M8589 Other specified disorders of bone density and structure, multiple sites: Secondary | ICD-10-CM

## 2022-08-24 DIAGNOSIS — M0579 Rheumatoid arthritis with rheumatoid factor of multiple sites without organ or systems involvement: Secondary | ICD-10-CM | POA: Diagnosis not present

## 2022-08-24 DIAGNOSIS — M19072 Primary osteoarthritis, left ankle and foot: Secondary | ICD-10-CM

## 2022-08-24 NOTE — Patient Instructions (Addendum)
Vaccines You are taking a medication(s) that can suppress your immune system.  The following immunizations are recommended: Flu annually Covid-19  Td/Tdap (tetanus, diphtheria, pertussis) every 10 years Pneumonia (Prevnar 15 then Pneumovax 23 at least 1 year apart.  Alternatively, can take Prevnar 20 without needing additional dose) Shingrix: 2 doses from 4 weeks to 6 months apart  Please check with your PCP to make sure you are up to date.  If you have signs or symptoms of an infection or start antibiotics: First, call your PCP for workup of your infection. Hold your medication through the infection, until you complete your antibiotics, and until symptoms resolve if you take the following: Injectable medication (Actemra, Benlysta, Cimzia, Cosentyx, Enbrel, Humira, Kevzara, Orencia, Remicade, Simponi, Stelara, Taltz, Tremfya) Methotrexate Leflunomide (Arava) Mycophenolate (Cellcept) Osborne [place], or Rinvoq    Exercises for Chronic Knee Pain Chronic knee pain is pain that lasts longer than 3 months. For most people with chronic knee pain, exercise and weight loss is an important part of treatment. Your health care provider may want you to focus on: Strengthening the muscles that support your knee. This can take pressure off your knee and lessen pain. Preventing knee stiffness. Maintaining or increasing how far you can move your knee. Losing weight (if this applies) to take pressure off your knee, decrease your risk for injury, and make it easier for you to exercise. Your health care provider will help you develop an exercise program that matches your needs and physical abilities. Below are simple, low-impact exercises you can do at home. Ask your health care provider or a physical therapist how often you should do your exercise program and how many times to repeat each exercise. General safety tips Follow these safety tips for exercising with chronic knee pain: Get your health care  provider's approval before doing any exercises. Start slowly and stop any time an exercise causes pain. Do not exercise if your knee pain is flaring up. Warm up first. Stretching a cold muscle can cause an injury. Do 5-10 minutes of easy movement or light stretching before beginning your exercise routine. Do 5-10 minutes of low-impact activity (like walking or cycling) before starting strengthening exercises. Contact your health care provider any time you have pain during or after exercising. Exercise may cause discomfort but should not be painful. It is normal to be a little stiff or sore after exercising.  Stretching and range-of-motion exercises Front thigh stretch  Stand up straight and support your body by holding on to a chair or resting one hand on a wall. With your legs straight and close together, bend one knee to lift your heel up toward your buttocks. Using one hand for support, grab your ankle with your free hand. Pull your foot up closer toward your buttocks to feel the stretch in front of your thigh. Hold the stretch for 30 seconds. Repeat __________ times. Complete this exercise __________ times a day. Back thigh stretch  Sit on the floor with your back straight and your legs out straight in front of you. Place the palms of your hands on the floor and slide them toward your feet as you bend at the hip. Try to touch your nose to your knees and feel the stretch in the back of your thighs. Hold for 30 seconds. Repeat __________ times. Complete this exercise __________ times a day. Calf stretch  Stand facing a wall. Place the palms of your hands flat against the wall, arms extended, and lean slightly against  the wall. Get into a lunge position with one leg bent at the knee and the other leg stretched out straight behind you. Keep both feet facing the wall and increase the bend in your knee while keeping the heel of the other leg flat on the ground. You should feel the stretch  in your calf. Hold for 30 seconds. Repeat __________ times. Complete this exercise __________ times a day. Strengthening exercises Straight leg lift Lie on your back with one knee bent and the other leg out straight. Slowly lift the straight leg without bending the knee. Lift until your foot is about 12 inches (30 cm) off the floor. Hold for 3-5 seconds and slowly lower your leg. Repeat __________ times. Complete this exercise __________ times a day. Single leg dip Stand between two chairs and put both hands on the backs of the chairs for support. Extend one leg out straight with your body weight resting on the heel of the standing leg. Slowly bend your standing knee to dip your body to the level that is comfortable for you. Hold for 3-5 seconds. Repeat __________ times. Complete this exercise __________ times a day. Hamstring curls Stand straight, knees close together, facing the back of a chair. Hold on to the back of a chair with both hands. Keep one leg straight. Bend the other knee while bringing the heel up toward the buttock until the knee is bent at a 90-degree angle (right angle). Hold for 3-5 seconds. Repeat __________ times. Complete this exercise __________ times a day. Wall squat Stand straight with your back, hips, and head against a wall. Step forward one foot at a time with your back still against the wall. Your feet should be 2 feet (61 cm) from the wall at shoulder width. Keeping your back, hips, and head against the wall, slide down the wall to as close of a sitting position as you can get. Hold for 5-10 seconds, then slowly slide back up. Repeat __________ times. Complete this exercise __________ times a day. Step-ups Step up with one foot onto a sturdy platform or stool that is about 6 inches (15 cm) high. Face sideways with one foot on the platform and one on the ground. Place all your weight on the platform foot and lift your body off the ground until your knee  extends. Let your other leg hang free to the side. Hold for 3-5 seconds then slowly lower your weight down to the floor foot. Repeat __________ times. Complete this exercise __________ times a day. Contact a health care provider if: Your exercise causes pain. Your pain is worse after you exercise. Your pain prevents you from doing your exercises. This information is not intended to replace advice given to you by your health care provider. Make sure you discuss any questions you have with your health care provider. Document Revised: 08/28/2019 Document Reviewed: 04/21/2019 Elsevier Patient Education  2023 [employer].

## 2022-09-19 ENCOUNTER — Encounter (HOSPITAL_COMMUNITY)
Admission: RE | Admit: 2022-09-19 | Discharge: 2022-09-19 | Disposition: A | Discharge status: Home / Self Care | Payer: Medicare Other | Source: Ambulatory Visit | Attending: Rheumatology | Admitting: Rheumatology

## 2022-09-19 VITALS — BP 145/76 | HR 61 | Temp 97.5°F | Resp 20 | Ht 62.5 in | Wt 211.4 lb

## 2022-09-19 DIAGNOSIS — M170 Bilateral primary osteoarthritis of knee: Secondary | ICD-10-CM | POA: Diagnosis not present

## 2022-09-19 DIAGNOSIS — M0579 Rheumatoid arthritis with rheumatoid factor of multiple sites without organ or systems involvement: Secondary | ICD-10-CM

## 2022-09-19 DIAGNOSIS — Z79899 Other long term (current) drug therapy: Secondary | ICD-10-CM | POA: Diagnosis not present

## 2022-09-19 DIAGNOSIS — M19072 Primary osteoarthritis, left ankle and foot: Secondary | ICD-10-CM | POA: Diagnosis not present

## 2022-09-19 DIAGNOSIS — M19041 Primary osteoarthritis, right hand: Secondary | ICD-10-CM

## 2022-09-19 DIAGNOSIS — M19042 Primary osteoarthritis, left hand: Secondary | ICD-10-CM | POA: Diagnosis not present

## 2022-09-19 DIAGNOSIS — M19071 Primary osteoarthritis, right ankle and foot: Secondary | ICD-10-CM | POA: Insufficient documentation

## 2022-09-19 DIAGNOSIS — M8589 Other specified disorders of bone density and structure, multiple sites: Secondary | ICD-10-CM | POA: Diagnosis not present

## 2022-09-19 LAB — CBC WITH DIFFERENTIAL/PLATELET
Abs Immature Granulocytes: 0.01 10*3/uL (ref 0.00–0.07)
Basophils Absolute: 0 10*3/uL (ref 0.0–0.1)
Basophils Relative: 1 %
Eosinophils Absolute: 0.1 10*3/uL (ref 0.0–0.5)
Eosinophils Relative: 2 %
HCT: 37.6 % (ref 36.0–46.0)
Hemoglobin: 12.7 g/dL (ref 12.0–15.0)
Immature Granulocytes: 0 %
Lymphocytes Relative: 24 %
Lymphs Abs: 1.5 10*3/uL (ref 0.7–4.0)
MCH: 33 pg (ref 26.0–34.0)
MCHC: 33.8 g/dL (ref 30.0–36.0)
MCV: 97.7 fL (ref 80.0–100.0)
Monocytes Absolute: 0.5 10*3/uL (ref 0.1–1.0)
Monocytes Relative: 8 %
Neutro Abs: 4 10*3/uL (ref 1.7–7.7)
Neutrophils Relative %: 65 %
Platelets: 282 10*3/uL (ref 150–400)
RBC: 3.85 MIL/uL — ABNORMAL LOW (ref 3.87–5.11)
RDW: 12.7 % (ref 11.5–15.5)
WBC: 6.2 10*3/uL (ref 4.0–10.5)
nRBC: 0 % (ref 0.0–0.2)

## 2022-09-19 LAB — COMPREHENSIVE METABOLIC PANEL
ALT: 10 U/L (ref 0–44)
AST: 17 U/L (ref 15–41)
Albumin: 3.8 g/dL (ref 3.5–5.0)
Alkaline Phosphatase: 59 U/L (ref 38–126)
Anion gap: 9 (ref 5–15)
BUN: 17 mg/dL (ref 8–23)
CO2: 25 mmol/L (ref 22–32)
Calcium: 8.8 mg/dL — ABNORMAL LOW (ref 8.9–10.3)
Chloride: 102 mmol/L (ref 98–111)
Creatinine, Ser: 0.75 mg/dL (ref 0.44–1.00)
GFR, Estimated: >60 mL/min (ref >60)
Glucose, Bld: 99 mg/dL (ref 70–99)
Potassium: 3.5 mmol/L (ref 3.5–5.1)
Sodium: 136 mmol/L (ref 135–145)
Total Bilirubin: 1 mg/dL (ref 0.3–1.2)
Total Protein: 6.5 g/dL (ref 6.5–8.1)

## 2022-09-19 LAB — VITAMIN D 25 HYDROXY (VIT D DEFICIENCY, FRACTURES): Vit D, 25-Hydroxy: 35.62 ng/mL (ref 30–100)

## 2022-09-19 MED ORDER — DIPHENHYDRAMINE HCL 25 MG PO CAPS: 25.0000 mg | ORAL_CAPSULE | Freq: Once | ORAL | Status: DC

## 2022-09-19 MED ORDER — SODIUM CHLORIDE 0.9 % IV SOLN
1000.0000 mg | INTRAVENOUS | Status: DC
  Administered 2022-09-19: 1000 mg via INTRAVENOUS
  Filled 2022-09-19: qty 40

## 2022-09-19 MED ORDER — ACETAMINOPHEN 325 MG PO TABS: 650.0000 mg | ORAL_TABLET | Freq: Once | ORAL | Status: DC

## 2022-09-19 NOTE — Addendum Note (Signed)
Encounter addended by: Wyvonne Lenz, RN on: 09/19/2022 10:56 AM  Actions taken: Therapy plan modified

## 2022-09-19 NOTE — Progress Notes (Signed)
CBC is normal.  Vitamin D is normal.  Calcium is low at 8.8.  Patient should take calcium supplement with total calcium intake of 1200 mg a day between dietary calcium and supplemental calcium.

## 2022-09-19 NOTE — Progress Notes (Signed)
Diagnosis: Rheumatoid Arthritis  Provider:  Pollyann Savoy MD  Procedure: IV Infusion  IV Type: Peripheral, IV Location: L Antecubital  Orencia (Abatacept), Dose: 1000 mg  Infusion Start Time: 0953 am  Infusion Stop Time: 1031 am  Post Infusion IV Care: Observation period completed and Peripheral IV Discontinued  Discharge: Condition: Good, Destination: Home . AVS Provided  Performed by:  Forrest Moron, RN

## 2022-10-02 ENCOUNTER — Other Ambulatory Visit: Payer: Self-pay | Admitting: Physician Assistant

## 2022-10-02 DIAGNOSIS — M0579 Rheumatoid arthritis with rheumatoid factor of multiple sites without organ or systems involvement: Secondary | ICD-10-CM

## 2022-10-03 NOTE — Telephone Encounter (Signed)
Last Fill: 07/10/2022  Eye exam: 11/11/2021   Labs: 09/19/2022 CBC is normal.  Vitamin D is normal.  Calcium is low at 8.8.  Patient should take calcium supplement with total calcium intake of 1200 mg a day between dietary calcium and supplemental calcium.   Next Visit: 02/09/2023  Last Visit: 08/24/2022  UE:[id number] arthritis with rheumatoid factor of multiple sites without organ or systems involvement   Current Dose per office note on 08/24/2022: Plaquenil 200 mg in the morning and 100 mg at bedtime.   Okay to refill Plaquenil?

## 2022-10-13 DIAGNOSIS — M8580 Other specified disorders of bone density and structure, unspecified site: Secondary | ICD-10-CM | POA: Diagnosis not present

## 2022-10-13 DIAGNOSIS — E7800 Pure hypercholesterolemia, unspecified: Secondary | ICD-10-CM | POA: Diagnosis not present

## 2022-10-13 DIAGNOSIS — F3341 Major depressive disorder, recurrent, in partial remission: Secondary | ICD-10-CM | POA: Diagnosis not present

## 2022-10-13 DIAGNOSIS — R7303 Prediabetes: Secondary | ICD-10-CM | POA: Diagnosis not present

## 2022-10-13 DIAGNOSIS — I10 Essential (primary) hypertension: Secondary | ICD-10-CM | POA: Diagnosis not present

## 2022-10-13 DIAGNOSIS — F419 Anxiety disorder, unspecified: Secondary | ICD-10-CM | POA: Diagnosis not present

## 2022-10-13 DIAGNOSIS — Z0000 Encounter for general adult medical examination without abnormal findings: Secondary | ICD-10-CM | POA: Diagnosis not present

## 2022-10-13 DIAGNOSIS — J4530 Mild persistent asthma, uncomplicated: Secondary | ICD-10-CM | POA: Diagnosis not present

## 2022-10-13 DIAGNOSIS — M069 Rheumatoid arthritis, unspecified: Secondary | ICD-10-CM | POA: Diagnosis not present

## 2022-10-17 ENCOUNTER — Encounter (HOSPITAL_COMMUNITY)
Admission: RE | Admit: 2022-10-17 | Discharge: 2022-10-17 | Disposition: A | Discharge status: Home / Self Care | Payer: Medicare Other | Source: Ambulatory Visit | Attending: Rheumatology | Admitting: Rheumatology

## 2022-10-17 VITALS — BP 122/68 | HR 67 | Temp 98.2°F | Resp 16 | Ht 62.0 in | Wt 208.5 lb

## 2022-10-17 DIAGNOSIS — Z79899 Other long term (current) drug therapy: Secondary | ICD-10-CM | POA: Insufficient documentation

## 2022-10-17 DIAGNOSIS — M0579 Rheumatoid arthritis with rheumatoid factor of multiple sites without organ or systems involvement: Secondary | ICD-10-CM | POA: Insufficient documentation

## 2022-10-17 MED ORDER — SODIUM CHLORIDE 0.9 % IV SOLN
1000.0000 mg | INTRAVENOUS | Status: DC
  Administered 2022-10-17: 1000 mg via INTRAVENOUS
  Filled 2022-10-17: qty 40

## 2022-10-17 MED ORDER — DIPHENHYDRAMINE HCL 25 MG PO CAPS: 25.0000 mg | ORAL_CAPSULE | Freq: Once | ORAL | Status: DC

## 2022-10-17 MED ORDER — ACETAMINOPHEN 325 MG PO TABS: 650.0000 mg | ORAL_TABLET | Freq: Once | ORAL | Status: DC

## 2022-10-17 NOTE — Progress Notes (Signed)
Diagnosis: Rheumatoid Arthritis  Provider:  Pollyann Savoy MD  Procedure: IV Infusion  IV Type: Peripheral, IV Location: L Antecubital  Orencia (Abatacept), Dose: 1000 mg  Infusion Start Time: 0932 am  Infusion Stop Time: 1007 am  Post Infusion IV Care: Observation period completed and Peripheral IV Discontinued  Discharge: Condition: Good, Destination: Home . AVS Provided  Performed by:  Forrest Moron, RN

## 2022-10-17 NOTE — Addendum Note (Signed)
Encounter addended by: Forrest Moron, RN on: 10/17/2022 12:39 PM  Actions taken: LDA properties accepted

## 2022-11-14 ENCOUNTER — Encounter (HOSPITAL_COMMUNITY)
Admission: RE | Admit: 2022-11-14 | Discharge: 2022-11-14 | Disposition: A | Discharge status: Home / Self Care | Payer: Medicare Other | Source: Ambulatory Visit | Attending: Rheumatology | Admitting: Rheumatology

## 2022-11-14 VITALS — BP 145/81 | HR 57 | Temp 97.6°F | Resp 18

## 2022-11-14 DIAGNOSIS — Z79899 Other long term (current) drug therapy: Secondary | ICD-10-CM

## 2022-11-14 DIAGNOSIS — M0579 Rheumatoid arthritis with rheumatoid factor of multiple sites without organ or systems involvement: Secondary | ICD-10-CM

## 2022-11-14 LAB — CBC WITH DIFFERENTIAL/PLATELET
Abs Immature Granulocytes: 0.01 10*3/uL (ref 0.00–0.07)
Basophils Absolute: 0 10*3/uL (ref 0.0–0.1)
Basophils Relative: 1 %
Eosinophils Absolute: 0.1 10*3/uL (ref 0.0–0.5)
Eosinophils Relative: 2 %
HCT: 39.5 % (ref 36.0–46.0)
Hemoglobin: 13.2 g/dL (ref 12.0–15.0)
Immature Granulocytes: 0 %
Lymphocytes Relative: 32 %
Lymphs Abs: 1.7 10*3/uL (ref 0.7–4.0)
MCH: 33 pg (ref 26.0–34.0)
MCHC: 33.4 g/dL (ref 30.0–36.0)
MCV: 98.8 fL (ref 80.0–100.0)
Monocytes Absolute: 0.5 10*3/uL (ref 0.1–1.0)
Monocytes Relative: 9 %
Neutro Abs: 3.1 10*3/uL (ref 1.7–7.7)
Neutrophils Relative %: 56 %
Platelets: 300 10*3/uL (ref 150–400)
RBC: 4 MIL/uL (ref 3.87–5.11)
RDW: 12.7 % (ref 11.5–15.5)
WBC: 5.4 10*3/uL (ref 4.0–10.5)
nRBC: 0 % (ref 0.0–0.2)

## 2022-11-14 LAB — COMPREHENSIVE METABOLIC PANEL
ALT: 11 U/L (ref 0–44)
AST: 17 U/L (ref 15–41)
Albumin: 3.9 g/dL (ref 3.5–5.0)
Alkaline Phosphatase: 56 U/L (ref 38–126)
Anion gap: 10 (ref 5–15)
BUN: 16 mg/dL (ref 8–23)
CO2: 25 mmol/L (ref 22–32)
Calcium: 9.2 mg/dL (ref 8.9–10.3)
Chloride: 101 mmol/L (ref 98–111)
Creatinine, Ser: 0.75 mg/dL (ref 0.44–1.00)
GFR, Estimated: >60 mL/min (ref >60)
Glucose, Bld: 96 mg/dL (ref 70–99)
Potassium: 3.4 mmol/L — ABNORMAL LOW (ref 3.5–5.1)
Sodium: 136 mmol/L (ref 135–145)
Total Bilirubin: 0.9 mg/dL (ref 0.3–1.2)
Total Protein: 7 g/dL (ref 6.5–8.1)

## 2022-11-14 MED ORDER — DIPHENHYDRAMINE HCL 25 MG PO CAPS
25.0000 mg | ORAL_CAPSULE | Freq: Once | ORAL | Status: DC
  Filled 2022-11-14: qty 1

## 2022-11-14 MED ORDER — SODIUM CHLORIDE 0.9 % IV SOLN
1000.0000 mg | INTRAVENOUS | Status: DC
  Administered 2022-11-14: 1000 mg via INTRAVENOUS
  Filled 2022-11-14: qty 40

## 2022-11-14 MED ORDER — ACETAMINOPHEN 325 MG PO TABS
650.0000 mg | ORAL_TABLET | Freq: Once | ORAL | Status: DC
  Filled 2022-11-14: qty 2

## 2022-11-14 NOTE — Progress Notes (Signed)
CBC normal.  CMP showed low potassium.  Please notify patient and forward results to her PCP.

## 2022-11-14 NOTE — Progress Notes (Signed)
Diagnosis: Rheumatoid Arthritis,   Provider:  Pollyann Savoy MD  Procedure: IV Infusion  IV Type: Peripheral, IV Location: L Antecubital  Orencia (Abatacept), Dose: 1000 mg  Infusion Start Time: 1021  Infusion Stop Time: 1054  Post Infusion IV Care: Patient declined observation and Peripheral IV Discontinued  Discharge: Condition: Good, Destination: Home . AVS Provided  Performed by:  Marlow Baars [place]Bur[place]tt, RN

## 2022-11-15 DIAGNOSIS — H25042 Posterior subcapsular polar age-related cataract, left eye: Secondary | ICD-10-CM | POA: Diagnosis not present

## 2022-11-15 DIAGNOSIS — H04123 Dry eye syndrome of bilateral lacrimal glands: Secondary | ICD-10-CM | POA: Diagnosis not present

## 2022-11-15 DIAGNOSIS — H524 Presbyopia: Secondary | ICD-10-CM | POA: Diagnosis not present

## 2022-11-15 DIAGNOSIS — H2512 Age-related nuclear cataract, left eye: Secondary | ICD-10-CM | POA: Diagnosis not present

## 2022-11-15 DIAGNOSIS — H43813 Vitreous degeneration, bilateral: Secondary | ICD-10-CM | POA: Diagnosis not present

## 2022-11-15 DIAGNOSIS — H25012 Cortical age-related cataract, left eye: Secondary | ICD-10-CM | POA: Diagnosis not present

## 2022-11-15 DIAGNOSIS — Z79899 Other long term (current) drug therapy: Secondary | ICD-10-CM | POA: Diagnosis not present

## 2022-11-15 DIAGNOSIS — H5203 Hypermetropia, bilateral: Secondary | ICD-10-CM | POA: Diagnosis not present

## 2022-11-30 ENCOUNTER — Encounter: Payer: Self-pay | Admitting: Rheumatology

## 2022-11-30 DIAGNOSIS — M069 Rheumatoid arthritis, unspecified: Secondary | ICD-10-CM | POA: Diagnosis not present

## 2022-11-30 DIAGNOSIS — Z1231 Encounter for screening mammogram for malignant neoplasm of breast: Secondary | ICD-10-CM | POA: Diagnosis not present

## 2022-11-30 DIAGNOSIS — M8588 Other specified disorders of bone density and structure, other site: Secondary | ICD-10-CM | POA: Diagnosis not present

## 2022-11-30 LAB — HM DEXA SCAN

## 2022-12-05 ENCOUNTER — Telehealth: Payer: Self-pay | Admitting: Pharmacy Technician

## 2022-12-05 NOTE — Telephone Encounter (Signed)
Auth Submission: NO AUTH NEEDED Site of care: Site of care: [facility] Payer: MEDICARE A/B & BCBS SUPP Medication & CPT/J Code(s) submitted: Orencia (Abatacept) [id number] Route of submission (phone, fax, portal):  Phone # Fax # Auth type: Buy/Bill HB Units/visits requested: 1000 MMG Q4WKS Reference number:  Approval from: 12/05/22 to 05/08/23

## 2022-12-06 ENCOUNTER — Telehealth: Payer: Self-pay | Admitting: *Deleted

## 2022-12-06 DIAGNOSIS — R922 Inconclusive mammogram: Secondary | ICD-10-CM | POA: Diagnosis not present

## 2022-12-06 DIAGNOSIS — R921 Mammographic calcification found on diagnostic imaging of breast: Secondary | ICD-10-CM | POA: Diagnosis not present

## 2022-12-06 NOTE — Telephone Encounter (Signed)
Received DEXA results from [facility].  Date of Scan: 11/30/2022  Lowest T-score:-2.5  BMD:0.574  Lowest site measured:Right Femoral Neck  DX: Osteoporosis  Significant changes in BMD and site measured (5% and above):-14 % Right Total Femur, -9% Left Total Femur  Current Regimen:Vitamin D  Recommendation:Discuss treatment options at follow up visit or earlier appointment if available.   Reviewed by:Dr. Pollyann Savoy   Next Appointment:  02/09/2023  Attempted to contact the patient and left message for patient to call the office.

## 2022-12-12 ENCOUNTER — Encounter (HOSPITAL_COMMUNITY): Payer: Medicare Other | Attending: Rheumatology | Admitting: Emergency Medicine

## 2022-12-12 ENCOUNTER — Encounter (HOSPITAL_COMMUNITY): Admission: RE | Admit: 2022-12-12 | Payer: Medicare Other | Source: Ambulatory Visit

## 2022-12-12 VITALS — BP 142/83 | HR 58 | Temp 98.3°F | Resp 18 | Ht 61.5 in | Wt 209.0 lb

## 2022-12-12 DIAGNOSIS — M19042 Primary osteoarthritis, left hand: Secondary | ICD-10-CM | POA: Diagnosis not present

## 2022-12-12 DIAGNOSIS — M65331 Trigger finger, right middle finger: Secondary | ICD-10-CM | POA: Diagnosis not present

## 2022-12-12 DIAGNOSIS — Z79899 Other long term (current) drug therapy: Secondary | ICD-10-CM | POA: Insufficient documentation

## 2022-12-12 DIAGNOSIS — M19041 Primary osteoarthritis, right hand: Secondary | ICD-10-CM | POA: Diagnosis not present

## 2022-12-12 DIAGNOSIS — Z8709 Personal history of other diseases of the respiratory system: Secondary | ICD-10-CM | POA: Diagnosis not present

## 2022-12-12 DIAGNOSIS — M170 Bilateral primary osteoarthritis of knee: Secondary | ICD-10-CM | POA: Diagnosis not present

## 2022-12-12 DIAGNOSIS — E559 Vitamin D deficiency, unspecified: Secondary | ICD-10-CM | POA: Diagnosis not present

## 2022-12-12 DIAGNOSIS — Z8659 Personal history of other mental and behavioral disorders: Secondary | ICD-10-CM | POA: Diagnosis not present

## 2022-12-12 DIAGNOSIS — Z8639 Personal history of other endocrine, nutritional and metabolic disease: Secondary | ICD-10-CM | POA: Insufficient documentation

## 2022-12-12 DIAGNOSIS — E079 Disorder of thyroid, unspecified: Secondary | ICD-10-CM | POA: Diagnosis not present

## 2022-12-12 DIAGNOSIS — Z8679 Personal history of other diseases of the circulatory system: Secondary | ICD-10-CM | POA: Insufficient documentation

## 2022-12-12 DIAGNOSIS — M810 Age-related osteoporosis without current pathological fracture: Secondary | ICD-10-CM | POA: Insufficient documentation

## 2022-12-12 DIAGNOSIS — M19071 Primary osteoarthritis, right ankle and foot: Secondary | ICD-10-CM | POA: Insufficient documentation

## 2022-12-12 DIAGNOSIS — M0579 Rheumatoid arthritis with rheumatoid factor of multiple sites without organ or systems involvement: Secondary | ICD-10-CM | POA: Insufficient documentation

## 2022-12-12 DIAGNOSIS — M19072 Primary osteoarthritis, left ankle and foot: Secondary | ICD-10-CM | POA: Diagnosis not present

## 2022-12-12 MED ORDER — ACETAMINOPHEN 325 MG PO TABS: 650.0000 mg | ORAL_TABLET | Freq: Once | ORAL | Status: DC

## 2022-12-12 MED ORDER — SODIUM CHLORIDE 0.9 % IV SOLN
1000.0000 mg | Freq: Once | INTRAVENOUS | Status: AC
  Administered 2022-12-12: 1000 mg via INTRAVENOUS
  Filled 2022-12-12: qty 40

## 2022-12-12 MED ORDER — DIPHENHYDRAMINE HCL 25 MG PO CAPS: 25.0000 mg | ORAL_CAPSULE | Freq: Once | ORAL | Status: DC

## 2022-12-12 NOTE — Progress Notes (Signed)
Diagnosis: Rheumatoid Arthritis  Provider:  Pollyann Savoy MD  Procedure: IV Infusion  IV Type: Peripheral, IV Location: L Antecubital  Orencia (Abatacept), Dose: 1000 mg  Infusion Start Time: 1610  Infusion Stop Time: 1020  Post Infusion IV Care: Patient declined observation and Peripheral IV Discontinued  Discharge: Condition: Good, Destination: Home . AVS Provided  Performed by:  Arrie Senate, RN

## 2022-12-22 NOTE — Progress Notes (Signed)
Office Visit Note  Patient: Monica [name]             Date of Birth: 09-Feb-1941           MRN: [id number]             PCP: Soundra Pilon, FNP Referring: Soundra Pilon, FNP Visit Date: 01/03/2023 Occupation: @GUAROCC @  Subjective:  DEXA results  History of Present Illness: Monica [name] is a 82 y.o. female with history of seropositive rheumatoid arthritis, osteoarthritis and osteoporosis.  Patient denies having a flare of rheumatoid arthritis.  She continues to have some pain and stiffness due to underlying osteoarthritis.  She states her right middle finger triggers intermittently.  She has been doing some stretching exercises for her hands which has been useful.  She continues to have knee joint discomfort.  She ambulates with the help of a cane.  Patient remains on IV Orencia 1000 mg infusions every 4 weeks and Plaquenil 200 mg in the morning and 100 mg in the evening. Her last Orencia infusion was administered on 12/12/2022.  She had no interruption in treatment.  Patient recently had DEXA scan and wants to discuss treatment options.  She states she consumes calcium rich diet and takes calcium supplement.  She has been also taking vitamin D.  Activities of Daily Living:  Patient reports morning stiffness for 1 1/2 hours.   Patient Denies nocturnal pain.  Difficulty dressing/grooming: Denies Difficulty climbing stairs: Reports Difficulty getting out of chair: Denies Difficulty using hands for taps, buttons, cutlery, and/or writing: Reports  Review of Systems  Constitutional:  Positive for fatigue.  HENT:  Positive for mouth dryness. Negative for mouth sores.   Eyes:  Positive for dryness.  Respiratory:  Negative for shortness of breath.   Cardiovascular:  Negative for chest pain and palpitations.  Gastrointestinal:  Negative for blood in stool, constipation and diarrhea.  Endocrine: Negative for increased urination.  Genitourinary:  Negative for involuntary urination.   Musculoskeletal:  Positive for joint pain, gait problem, joint pain, joint swelling, myalgias, muscle weakness, morning stiffness and myalgias. Negative for muscle tenderness.  Skin:  Negative for color change, rash, hair loss and sensitivity to sunlight.  Allergic/Immunologic: Negative for susceptible to infections.  Neurological:  Negative for dizziness and headaches.  Hematological:  Negative for swollen glands.  Psychiatric/Behavioral:  Positive for depressed mood. Negative for sleep disturbance. The patient is not nervous/anxious.     PMFS History:  Patient Active Problem List   Diagnosis Date Noted   Asthmatic bronchitis 02/16/2019   Osteopenia 12/20/2016   History of depression 12/11/2016   High risk medication use 05/15/2016   Primary osteoarthritis of both hands 05/15/2016   Primary osteoarthritis of both feet 05/15/2016   Primary osteoarthritis of both knees 05/15/2016   Vitamin D deficiency 05/15/2016   History of hypertension 05/15/2016   History of hyperlipidemia 05/15/2016   Rheumatoid arthritis with rheumatoid factor of multiple sites without organ or systems involvement (HCC) 01/10/2016   Seasonal allergic rhinitis 10/03/2011   HYPERLIPIDEMIA 12/31/2008   HYPERTENSION 12/31/2008   Mild persistent asthmatic bronchitis with exacerbation 12/31/2008    Past Medical History:  Diagnosis Date   Arthritis    Asthma    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, FEV1 119%   Hyperlipidemia    Hypertension     Family History  Problem Relation Age of Onset   Lung cancer Father    Alzheimer's disease Mother    Past Surgical History:  Procedure Laterality Date   CARPAL TUNNEL RELEASE     FOOT SURGERY     MOUTH SURGERY Right 05/13/2020   Social History   Social History Narrative   Not on file   Immunization History  Administered Date(s) Administered   Fluad Quad(high Dose 65+) 02/13/2019, 02/18/2020, 02/17/2021   Influenza Nasal 02/29/2012   Influenza Split  03/02/2011   Influenza, High Dose Seasonal PF 02/12/2017, 02/12/2018   Influenza,inj,Quad PF,6+ Mos 02/25/2013, 02/23/2014   Influenza-Unspecified 03/15/2015   Moderna Sars-Covid-2 Vaccination 07/16/2019, 08/13/2019, 08/06/2020   Pneumococcal Conjugate-13 02/25/2013   Zoster Recombinant(Shingrix) 09/27/2021     Objective: Vital Signs: BP 129/81 (BP Location: Left Arm, Patient Position: Sitting, Cuff Size: Normal)   Pulse 69   Resp 16   Ht 5' 1.5" (1.562 m)   Wt 211 lb (95.7 kg)   BMI 39.22 kg/m    Physical Exam Vitals and nursing note reviewed.  Constitutional:      Appearance: She is well-developed.  HENT:     Head: Normocephalic and atraumatic.  Eyes:     Conjunctiva/sclera: Conjunctivae normal.  Cardiovascular:     Rate and Rhythm: Normal rate and regular rhythm.     Heart sounds: Normal heart sounds.  Pulmonary:     Effort: Pulmonary effort is normal.     Breath sounds: Normal breath sounds.  Abdominal:     General: Bowel sounds are normal.     Palpations: Abdomen is soft.  Musculoskeletal:     Cervical back: Normal range of motion.  Lymphadenopathy:     Cervical: No cervical adenopathy.  Skin:    General: Skin is warm and dry.     Capillary Refill: Capillary refill takes less than 2 seconds.  Neurological:     Mental Status: She is alert and oriented to person, place, and time.  Psychiatric:        Behavior: Behavior normal.      Musculoskeletal Exam: Cervical spine was in good range of motion.  Shoulder joints, elbow joints, wrist joints, MCPs and PIPs been good range of motion.  She had bilateral PIP and DIP thickening with limited extension of some of the PIP and DIP joints.  She had mild inflammation in her right fifth PIP joint.  She had right middle trigger finger with some thickening of her flexor tendon.  Hip joints and knee joints were in good range of motion.  She has valgus deformity of her left knee joint.  There was no tenderness over ankles or MTPs.   Bilateral pes cavus and dorsal spurring was noted.  CDAI Exam: CDAI Score: 4  Patient Global: 10 / 100; Provider Global: 10 / 100 Swollen: 1 ; Tender: 1  Joint Exam 01/03/2023      Right  Left  PIP 5 (finger)  Swollen Tender        Investigation: No additional findings.  Imaging: No results found.  Recent Labs: Lab Results  Component Value Date   WBC 5.4 11/14/2022   HGB 13.2 11/14/2022   PLT 300 11/14/2022   NA 136 11/14/2022   K 3.4 (L) 11/14/2022   CL 101 11/14/2022   CO2 25 11/14/2022   GLUCOSE 96 11/14/2022   BUN 16 11/14/2022   CREATININE 0.75 11/14/2022   BILITOT 0.9 11/14/2022   ALKPHOS 56 11/14/2022   AST 17 11/14/2022   ALT 11 11/14/2022   PROT 7.0 11/14/2022   ALBUMIN 3.9 11/14/2022   CALCIUM 9.2 11/14/2022   GFRAA >60 01/27/2020  QFTBGOLD Negative 07/24/2016   QFTBGOLDPLUS Negative 05/30/2022      Speciality Comments: PLQ eye exam: 11/15/2022 WNL  @ [facility]. Follow up in 1 year.  Orencia 1000 mg IV q 28days started on 09/2016  Prior therapy includes: Simponi Aria (inadequate response)  Procedures:  No procedures performed Allergies: Arava [leflunomide] and Piroxicam   Assessment / Plan:     Visit Diagnoses: Rheumatoid arthritis with rheumatoid factor of multiple sites without organ or systems involvement (HCC) -patient denies having a flare of rheumatoid arthritis since her last visit.  She has been taking Plaquenil and getting Orencia infusions on a regular basis.  She had no synovitis on the examination except for mild swelling of her right fifth PIP joint which could be due to underlying osteoarthritis as well.  She continues to have discomfort in her hands and knee joints due to underlying osteoarthritis.  A prescription refill for Plaquenil was sent per patient's request.  Plan: hydroxychloroquine (PLAQUENIL) 200 MG tablet  High risk medication use - Orencia IV 1000 mg every 28 days (started May 2018), Plaquenil 200 mg in the  morning and 100 mg at bedtime. PLQ eye exam: 11/15/2022.  Labs obtained on November 14, 2022 CBC and CMP were normal.  TB Gold was negative on May 30, 2022.  Information about immunization was placed in the AVS.  She was advised to hold Orencia if she develops an infection resume after the infection resolves.  Primary osteoarthritis of both hands-she has severe osteoarthritis in her hands.  She has been doing an muscle strengthening exercises which has been helpful.  Trigger finger, right middle finger-she is not triggering.  I discussed the option of cortisone injection in the future if she has ongoing triggering.  Primary osteoarthritis of both knees-she has osteoarthritis in her bilateral knee joints with valgus deformity in her left knee joint.  She continues to ambulates with the help of a cane.  Primary osteoarthritis of both feet-she has pes cavus with dorsal spurring.  No synovitis was noted.  Age-related osteoporosis without current pathological fracture -November 30, 2022 T-score -2.5, BMD 0.574 right femoral neck no comparison, right total femur -14%, left total femur -9%, AP spine no comparison. DEXA 11/10/2020 T-score: -1.9, BMD: 0.639 right femoral neck. -I did detailed discussion with the patient regarding the DEXA scan results.  She is in the osteoporosis range.  Different treatment options and their side effects were discussed.  Indications and side effects of alendronate were discussed.  Handout was given.  Increased risk of atypical fracture and osteonecrosis of the jaw were also discussed.  Good oral hygiene and regular visits with the dentist were discussed.  Plan: alendronate (FOSAMAX) 70 MG tablet weekly with full glass of water was discussed.  She was advised not to recline after taking the medication.  I will also obtain TSH, PTH, phosphorus and vitamin D level with her next labs.  Vitamin D deficiency-patient has been taking vitamin D 2000 units daily.  Will check vitamin D level and  adjust the dose accordingly.  History of hyperlipidemia  History of hypertension-blood pressure was normal at 129/81 today.  History of asthma  History of depression-she is on Zoloft.  Orders: No orders of the defined types were placed in this encounter.  Meds ordered this encounter  Medications   hydroxychloroquine (PLAQUENIL) 200 MG tablet    Sig: TAKE 1 TABLET BY MOUTH IN THE MORNING AND 1/2 TABLET AT BEDTIME.    Dispense:  135 tablet  Refill:  0   alendronate (FOSAMAX) 70 MG tablet    Sig: Take 1 tablet (70 mg total) by mouth once a week. Take with a full glass of water on an empty stomach.    Dispense:  4 tablet    Refill:  3     Follow-Up Instructions: Return in about 5 months (around 06/05/2023) for Rheumatoid arthritis, Osteoarthritis, Osteoporosis.   Pollyann Savoy, MD  Note - This record has been created using Animal nutritionist.  Chart creation errors have been sought, but may not always  have been located. Such creation errors do not reflect on  the standard of medical care.

## 2022-12-25 ENCOUNTER — Encounter: Payer: Self-pay | Admitting: Internal Medicine

## 2022-12-28 DIAGNOSIS — E7800 Pure hypercholesterolemia, unspecified: Secondary | ICD-10-CM | POA: Diagnosis not present

## 2022-12-28 DIAGNOSIS — Z23 Encounter for immunization: Secondary | ICD-10-CM | POA: Diagnosis not present

## 2022-12-28 DIAGNOSIS — J4530 Mild persistent asthma, uncomplicated: Secondary | ICD-10-CM | POA: Diagnosis not present

## 2023-01-03 ENCOUNTER — Encounter: Payer: Medicare Other | Admitting: Rheumatology

## 2023-01-03 ENCOUNTER — Other Ambulatory Visit: Payer: Self-pay | Admitting: Pharmacist

## 2023-01-03 ENCOUNTER — Encounter: Payer: Self-pay | Admitting: Rheumatology

## 2023-01-03 ENCOUNTER — Other Ambulatory Visit: Payer: Self-pay | Admitting: Physician Assistant

## 2023-01-03 VITALS — BP 129/81 | HR 69 | Resp 16 | Ht 61.5 in | Wt 211.0 lb

## 2023-01-03 DIAGNOSIS — Z8679 Personal history of other diseases of the circulatory system: Secondary | ICD-10-CM

## 2023-01-03 DIAGNOSIS — M19042 Primary osteoarthritis, left hand: Secondary | ICD-10-CM

## 2023-01-03 DIAGNOSIS — M19041 Primary osteoarthritis, right hand: Secondary | ICD-10-CM | POA: Diagnosis not present

## 2023-01-03 DIAGNOSIS — Z8639 Personal history of other endocrine, nutritional and metabolic disease: Secondary | ICD-10-CM

## 2023-01-03 DIAGNOSIS — Z79899 Other long term (current) drug therapy: Secondary | ICD-10-CM

## 2023-01-03 DIAGNOSIS — M0579 Rheumatoid arthritis with rheumatoid factor of multiple sites without organ or systems involvement: Secondary | ICD-10-CM

## 2023-01-03 DIAGNOSIS — M65331 Trigger finger, right middle finger: Secondary | ICD-10-CM

## 2023-01-03 DIAGNOSIS — Z8659 Personal history of other mental and behavioral disorders: Secondary | ICD-10-CM | POA: Diagnosis not present

## 2023-01-03 DIAGNOSIS — M19072 Primary osteoarthritis, left ankle and foot: Secondary | ICD-10-CM

## 2023-01-03 DIAGNOSIS — M810 Age-related osteoporosis without current pathological fracture: Secondary | ICD-10-CM | POA: Diagnosis not present

## 2023-01-03 DIAGNOSIS — M170 Bilateral primary osteoarthritis of knee: Secondary | ICD-10-CM

## 2023-01-03 DIAGNOSIS — M8589 Other specified disorders of bone density and structure, multiple sites: Secondary | ICD-10-CM

## 2023-01-03 DIAGNOSIS — Z8709 Personal history of other diseases of the respiratory system: Secondary | ICD-10-CM

## 2023-01-03 DIAGNOSIS — M19071 Primary osteoarthritis, right ankle and foot: Secondary | ICD-10-CM | POA: Diagnosis not present

## 2023-01-03 DIAGNOSIS — E559 Vitamin D deficiency, unspecified: Secondary | ICD-10-CM | POA: Diagnosis not present

## 2023-01-03 MED ORDER — ALENDRONATE SODIUM 70 MG PO TABS: 70.0000 mg | ORAL_TABLET | ORAL | 3 refills | Status: DC

## 2023-01-03 MED ORDER — HYDROXYCHLOROQUINE SULFATE 200 MG PO TABS
ORAL_TABLET | ORAL | 0 refills | Status: DC
Start: 2023-01-03 — End: 2023-03-26

## 2023-01-03 NOTE — Progress Notes (Signed)
Pharmacy Note  Subjective: Patient presents today to the [facility] for follow up office visit.  Patient seen by pharmacist for counseling on bisphosphonate therapy for osteoporosis. Patient has no history of dysphagia or GERD. She does report pill burden  Objective: T-score: -2.5 at RFN, DEXA completed on 11/30/2022 Significant changes in BMD and site measured (5% and above):-14 % Right Total Femur, -9% Left Total Femur  Lab Results  Component Value Date   VD25OH 35.62 09/19/2022   CMP     Component Value Date/Time   NA 136 11/14/2022 0934   K 3.4 (L) 11/14/2022 0934   CL 101 11/14/2022 0934   CO2 25 11/14/2022 0934   GLUCOSE 96 11/14/2022 0934   BUN 16 11/14/2022 0934   CREATININE 0.75 11/14/2022 0934   CALCIUM 9.2 11/14/2022 0934   PROT 7.0 11/14/2022 0934   ALBUMIN 3.9 11/14/2022 0934   AST 17 11/14/2022 0934   ALT 11 11/14/2022 0934   ALKPHOS 56 11/14/2022 0934   BILITOT 0.9 11/14/2022 0934   GFRNONAA >60 11/14/2022 0934   GFRAA >60 01/27/2020 0931    Assessment/Plan: Counseled patient that alendronate is an oral bisphosphonate that reduces bone turnover by inhibiting osteoclasts that chew up bone.  Counseled patient on purpose, proper use, and adverse effects of alendronate.  Reviewed with patient that alendronate should be taken once weekly.  Alendronate must be taking first thing in the morning, with a full glass of water, and she must wait an hour prior to eating food.  Also advised patient that she should not lie down until after she has eaten.  Provided patient with medication education material and answered all questions.  Reviewed adverse events of alendronate including risk of nausea & diarrhea, headache, and muscle & bone pain.  Reviewed rare adverse effect of osteonecrosis of the jaw and advised patient to alert her dentist that she is on alendronate prior to any major dental work.  Patient confirms she does not have any major dental work scheduled at  this time.  She sees dentist twice yearly. Patient agrees to trial of alendronate at this time.     Reviewed importance of taking calcium and vitamin D with bisphosphonate therapy. Recommended daily amount of calcium is 1200mg  and vitamin D [phone] units.  Advised calcium is better obtained through diet vs supplement.  Counseled about risk of excess calcium supplementation such a kidney stones and increased risk of heart disease.  Patient advised to split calcium to 600mg  twice daily or reduce pill burden by taking combination calcium-vitamin D OTC supplement  She will have baseline labs completed with upcoming Orencia infusion on 9//2024 - TSH, phosphorous, Vitamin D, PTH - prior to initiating alendronate.   Chesley Mires, PharmD, MPH, BCPS, CPP Clinical Pharmacist (Rheumatology and Pulmonology)

## 2023-01-03 NOTE — Progress Notes (Signed)
Patient saw Dr. Corliss Skains today and will be starting alendronate. Will need baseline PTH, TSH, Vitamin D, and phosphorous which we'd like drawn with upcoming Orencia infusion on 01/09/2023 at [facility] infusion center.  Messaged Estanislado Emms, RN at [facility].  Chesley Mires, PharmD, MPH, BCPS, CPP Clinical Pharmacist (Rheumatology and Pulmonology)

## 2023-01-03 NOTE — Patient Instructions (Addendum)
Alendronate Solution What is this medication? ALENDRONATE (a LEN droe nate) treats osteoporosis. It works by Interior and spatial designer stronger and less likely to break (fracture). It belongs to a group of medications called bisphosphonates. This medicine may be used for other purposes; ask your health care provider or pharmacist if you have questions. COMMON BRAND NAME(S): Fosamax What should I tell my care team before I take this medication? They need to know if you have any of these conditions: Bleeding disorder Cancer Dental disease Difficulty swallowing Infection (fever, chills, cough, sore throat, pain or trouble passing urine) Kidney disease Low levels of calcium or other minerals in the blood Low red blood cell counts Receiving steroids like dexamethasone or prednisone Stomach or intestine problems Trouble sitting or standing for 30 minutes An unusual or allergic reaction to alendronate, other medications, foods, dyes or preservatives Pregnant or trying to get pregnant Breast-feeding How should I use this medication? Take this medication by mouth with a full glass of water. Take it as directed on the prescription label at the same time every day. Use a specially marked oral syringe, spoon, or dropper to measure each dose. Ask your pharmacist if you do not have one. Household spoons are not accurate. Take the dose right after waking up. Do not eat or drink anything before taking it. Do not take it with any other drink except water. After taking it, do not eat breakfast, drink, or take any other medications or vitamins for at least 30 minutes. Sit or stand up for at least 30 minutes after you take it. Do not lie down. Keep taking it unless your care team tells you to stop. A special MedGuide will be given to you by the pharmacist with each prescription and refill. Be sure to read this information carefully each time. Talk to your care team about the use of this medication in children. Special  care may be needed. Overdosage: If you think you have taken too much of this medicine contact a poison control center or emergency room at once. NOTE: This medicine is only for you. Do not share this medicine with others. What if I miss a dose? If you take your medication once a day, skip it. Take your next dose at the scheduled time the next morning. Do not take two doses on the same day. If you take your medication once a week, take the missed dose on the morning after you remember. Do not take two doses on the same day. What may interact with this medication? Aluminum hydroxide Antacids Aspirin Calcium supplements Iron supplements Magnesium supplements Medications for inflammation like ibuprofen, naproxen, and others Vitamins with minerals This list may not describe all possible interactions. Give your health care provider a list of all the medicines, herbs, non-prescription drugs, or dietary supplements you use. Also tell them if you smoke, drink alcohol, or use illegal drugs. Some items may interact with your medicine. What should I watch for while using this medication? Visit your care team for regular checks on your progress. It may be some time before you see the benefit from this medication. Some people who take this medication have severe bone, joint, or muscle pain. This medication may also increase your risk for jaw problems or a broken thigh bone. Tell your care team right away if you have severe pain in your jaw, bones, joints, or muscles. Tell you care team if you have any pain that does not go away or that gets worse. Tell your dentist  and dental surgeon that you are taking this medication. You should not have major dental surgery while on this medication. See your dentist to have a dental exam and fix any dental problems before starting this medication. Take good care of your teeth while on this medication. Make sure you see your dentist for regular follow-up appointments. You  should make sure you get enough calcium and vitamin D while you are taking this medication. Discuss the foods you eat and the vitamins you take with your care team. You may need blood work done while you are taking this medication. What side effects may I notice from receiving this medication? Side effects that you should report to your care team as soon as possible: Allergic reactions--skin rash, itching, hives, swelling of the face, lips, tongue, or throat Low calcium level--muscle pain or cramps, confusion, tingling, or numbness in the hands or feet Osteonecrosis of the jaw--pain, swelling, or redness in the mouth, numbness of the jaw, poor healing after dental work, unusual discharge from the mouth, visible bones in the mouth Pain or trouble swallowing Severe bone, joint, or muscle pain Stomach bleeding--bloody or black, tar-like stools, vomiting blood or brown material that looks like coffee grounds Side effects that usually do not require medical attention (report to your care team if they continue or are bothersome): Constipation Diarrhea Nausea Stomach pain This list may not describe all possible side effects. Call your doctor for medical advice about side effects. You may report side effects to FDA at 1-800-FDA-1088. Where should I keep my medication? Keep out of the reach of children and pets. Store at room temperature between 20 and 25 degrees C (68 and 77 degrees F). Do not freeze. Throw away any unused medication after the expiration date. NOTE: This sheet is a summary. It may not cover all possible information. If you have questions about this medicine, talk to your doctor, pharmacist, or health care provider.  2024 Elsevier/Gold Standard (2020-04-22 00:00:00)   Vaccines You are taking a medication(s) that can suppress your immune system.  The following immunizations are recommended: Flu annually Covid-19  RSV Td/Tdap (tetanus, diphtheria, pertussis) every 10  years Pneumonia (Prevnar 15 then Pneumovax 23 at least 1 year apart.  Alternatively, can take Prevnar 20 without needing additional dose) Shingrix: 2 doses from 4 weeks to 6 months apart  Please check with your PCP to make sure you are up to date.  If you have signs or symptoms of an infection or start antibiotics: First, call your PCP for workup of your infection. Hold your medication through the infection, until you complete your antibiotics, and until symptoms resolve if you take the following: Injectable medication (Actemra, Benlysta, Cimzia, Cosentyx, Enbrel, Humira, Kevzara, Orencia, Remicade, Simponi, Stelara, Taltz, Tremfya) Methotrexate Leflunomide (Arava) Mycophenolate (Cellcept) Monica [name], Monica [name], or Monica [name]

## 2023-01-04 ENCOUNTER — Encounter: Payer: Self-pay | Admitting: Pulmonary Disease

## 2023-01-04 ENCOUNTER — Ambulatory Visit: Payer: Medicare Other | Admitting: Pulmonary Disease

## 2023-01-04 VITALS — BP 120/70 | HR 70 | Ht 61.5 in | Wt 210.2 lb

## 2023-01-04 DIAGNOSIS — J4540 Moderate persistent asthma, uncomplicated: Secondary | ICD-10-CM

## 2023-01-04 DIAGNOSIS — R490 Dysphonia: Secondary | ICD-10-CM | POA: Diagnosis not present

## 2023-01-04 NOTE — Patient Instructions (Signed)
Is nice to meet you  It is quite possible that the hoarseness is related to the inhaled steroid and Trelegy  Is okay to take Benadryl at night for 2-3 nights to see if this improves things in case there is some allergic component.  I would expect phlegm production, nasal congestion, sinus congestion, runny nose if this was the case.  If is not getting better, I recommend stopping Trelegy.  I would stop it for at least 1 week, ideally up to 2 weeks.  See if the voice returns stronger in the meantime.  When you stop Trelegy you can use Stiolto 2 puffs once a day.  This contains 2 of the 3 medicines and Trelegy but it omits the inhaled steroid.  I would consider a referral to an ENT doctor to look at the vocal cords in the future if despite our interventions the hoarseness persists.  Return to clinic in 6 weeks with Dr. Maple Hudson or sooner as needed

## 2023-01-04 NOTE — Progress Notes (Signed)
@Patient  ID: Monica [name], female    DOB: 1940-06-02, 82 y.o.   MRN: [id number]  Chief Complaint  Patient presents with   Acute Visit    Pt states her hoarseness started 3 weeks ago.     Referring provider: Soundra Pilon, FNP  HPI:   82 y.o. woman with history of asthma followed by [name], MD here for evaluation of hoarseness, acute visit.  Multiple prior pulmonary notes reviewed.  She states she has had a hoarse voice for about the last 3 weeks.  Came out of the blue.  Denies any URI symptoms.  No cough.  No fever or chills.  No sore throat.  No nasal congestion, rhinorrhea, sinus congestion, postnasal drip.  Just happen out of the blue without infectious symptoms or odd symptoms.  Only thing going on.  No worsening dyspnea etc.  We discussed likely cause of hoarseness to be mechanical in nature and likely related to thinning of the vocal cords with ICS administration.  Admittedly she has been on ICS for years now unclear why now just happening.  Also discussed possible vocal cord abnormality and my recommendation to be evaluated by ENT doctor.  Questionaires / Pulmonary Flowsheets:   ACT:  Asthma Control Test ACT Total Score  02/17/2022  9:23 AM 23    MMRC:     No data to display          Epworth:      No data to display          Tests:   FENO:  Lab Results  Component Value Date   NITRICOXIDE 32 02/12/2017    PFT:     No data to display          WALK:      No data to display          Imaging: No results found.  Lab Results:  CBC    Component Value Date/Time   WBC 5.4 11/14/2022 0933   RBC 4.00 11/14/2022 0933   HGB 13.2 11/14/2022 0933   HCT 39.5 11/14/2022 0933   PLT 300 11/14/2022 0933   MCV 98.8 11/14/2022 0933   MCH 33.0 11/14/2022 0933   MCHC 33.4 11/14/2022 0933   RDW 12.7 11/14/2022 0933   LYMPHSABS 1.7 11/14/2022 0933   MONOABS 0.5 11/14/2022 0933   EOSABS 0.1 11/14/2022 0933   BASOSABS 0.0 11/14/2022 0933     BMET    Component Value Date/Time   NA 136 11/14/2022 0934   K 3.4 (L) 11/14/2022 0934   CL 101 11/14/2022 0934   CO2 25 11/14/2022 0934   GLUCOSE 96 11/14/2022 0934   BUN 16 11/14/2022 0934   CREATININE 0.75 11/14/2022 0934   CALCIUM 9.2 11/14/2022 0934   GFRNONAA >60 11/14/2022 0934   GFRAA >60 01/27/2020 0931    BNP No results found for: "BNP"  ProBNP No results found for: "PROBNP"  Specialty Problems       Pulmonary Problems   Mild persistent asthmatic bronchitis with exacerbation    PFT 02/02/09-mild obstruction small airways, minimal response bronchodilator, DLCO mildly reduced. FVC 3.36/116%, FEV1 2.48/119%, ratio 0.74, FEF 25-75% 1.80/75%, TLC 108%, DLCO 70% Office Spirometry 09/08/16-WNL-FVC 2.63/98%, FEV1 1.91/95%, ratio 0.73, FEF 25-75% 1.32/82%       Seasonal allergic rhinitis   Asthmatic bronchitis    Allergies  Allergen Reactions   Arava [Leflunomide] Cough   Piroxicam     REACTION: effected eyes    Immunization History  Administered Date(s) Administered   Fluad Quad(high Dose 65+) 02/13/2019, 02/18/2020, 02/17/2021   Influenza Nasal 02/29/2012   Influenza Split 03/02/2011   Influenza, High Dose Seasonal PF 02/12/2017, 02/12/2018   Influenza,inj,Quad PF,6+ Mos 02/25/2013, 02/23/2014   Influenza-Unspecified 03/15/2015   Moderna Sars-Covid-2 Vaccination 07/16/2019, 08/13/2019, 08/06/2020   Pneumococcal Conjugate-13 02/25/2013   Zoster Recombinant(Shingrix) 09/27/2021    Past Medical History:  Diagnosis Date   Arthritis    Asthma    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, FEV1 119%   Hyperlipidemia    Hypertension     Tobacco History: Social History   Tobacco Use  Smoking Status Never   Passive exposure: Never  Smokeless Tobacco Never   Counseling given: Not Answered   Continue to not smoke  Outpatient Encounter Medications as of 01/04/2023  Medication Sig   Abatacept (ORENCIA IV) Inject 1,000 mg into the vein every 28  (twenty-eight) days. Last order placed on 10/30/19 for 2 doses.   acetaminophen (TYLENOL) 500 MG tablet Take 1,000 mg by mouth every 8 (eight) hours.   albuterol (VENTOLIN HFA) 108 (90 Base) MCG/ACT inhaler INHALE 1-2 PUFFS EVERY 4 HOURS AS NEEDED FOR SHORTNESS OF BREATH/WHEEZING.   alendronate (FOSAMAX) 70 MG tablet Take 1 tablet (70 mg total) by mouth once a week. Take with a full glass of water on an empty stomach.   cholecalciferol (VITAMIN D) 1000 UNITS tablet Take 2,000 Units by mouth daily.   diclofenac Sodium (VOLTAREN) 1 % GEL APPLY 2-4 GRAMS TO AFFECTED JOINT UP TO 4 TIMES DAILY.   diphenhydrAMINE (BENADRYL) 25 MG tablet Take 25 mg by mouth as needed (Prior to Orencia infusions).   Fluticasone-Umeclidin-Vilant (TRELEGY ELLIPTA) 100-62.5-25 MCG/ACT AEPB INHALE 1 PUFF DAILY.   furosemide (LASIX) 20 MG tablet Take 20 mg by mouth daily as needed.   hydrochlorothiazide (HYDRODIURIL) 25 MG tablet Take 25 mg by mouth daily.   hydroxychloroquine (PLAQUENIL) 200 MG tablet TAKE 1 TABLET BY MOUTH IN THE MORNING AND 1/2 TABLET AT BEDTIME.   ibuprofen (ADVIL,MOTRIN) 200 MG tablet Take 200 mg by mouth daily.    lovastatin (MEVACOR) 20 MG tablet Take 20 mg by mouth at bedtime.   sertraline (ZOLOFT) 100 MG tablet Take 100 mg by mouth daily.   No facility-administered encounter medications on file as of 01/04/2023.     Review of Systems  Review of Systems  No chest pain with exertion.  No orthopnea or PND.  No weight loss.  Physical Exam  BP 120/70   Pulse 70   Ht 5' 1.5" (1.562 m)   Wt 210 lb 3.2 oz (95.3 kg)   SpO2 97%   BMI 39.07 kg/m   Wt Readings from Last 5 Encounters:  01/04/23 210 lb 3.2 oz (95.3 kg)  01/03/23 211 lb (95.7 kg)  12/12/22 209 lb (94.8 kg)  10/17/22 208 lb 8 oz (94.6 kg)  09/19/22 211 lb 6.7 oz (95.9 kg)    BMI Readings from Last 5 Encounters:  01/04/23 39.07 kg/m  01/03/23 39.22 kg/m  12/12/22 38.85 kg/m  10/17/22 38.14 kg/m  09/19/22 38.05 kg/m      Physical Exam General: Sitting in chair, no acute distress Eyes: EOMI, no icterus Neck: Supple, no JVP Mouth: Clear oropharynx without erythema or abnormality Pulmonary: Clear, normal work of breathing Cardiovascular: Warm, no edema MSK: No synovitis, no joint effusion Neuro: Normal gait, weakness Psych: Normal mood, full affect   Assessment & Plan:   Hoarseness: Present for about 3 weeks.  Out of the  blue.  No preceding viral or infectious symptoms.  No pain.  No true laryngitis.  Suspect some abnormality vocal cord.  She is on Trelegy.  Suspect atrophy in the setting of ICS administration.  Counseled to stop Trelegy for a period of time.  Can replace with Stiolto in the meantime.  Also recommended ENT evaluation to see if there are vocal cord lesions.  She declines ENT evaluation.  Advised her to contact us if hoarseness does not improve with holding Trelegy.  Advise she can resume Trelegy after holding for a couple weeks if symptoms improve.  Asthma: Historically well-controlled on Trelegy.  See above altering therapy, substation for [facility] for now given concern for ICS causing pulmonary atrophy and hoarseness.  May need to resume Trelegy in the future and just deal with vocal cord issues especially if voice improves with stopping Trelegy.   Return in about 6 weeks (around 02/15/2023) for with Dr. Maple Hudson.   Karren Burly, MD 01/04/2023   This appointment required 40 minutes of patient care (this includes precharting, chart review, review of results, face-to-face care, etc.).

## 2023-01-09 ENCOUNTER — Encounter (HOSPITAL_COMMUNITY): Payer: Medicare Other | Attending: Rheumatology | Admitting: Emergency Medicine

## 2023-01-09 ENCOUNTER — Other Ambulatory Visit: Payer: Self-pay | Admitting: Rheumatology

## 2023-01-09 ENCOUNTER — Encounter (HOSPITAL_COMMUNITY): Payer: Medicare Other

## 2023-01-09 VITALS — BP 143/84 | HR 57 | Temp 98.1°F | Resp 18

## 2023-01-09 DIAGNOSIS — Z79899 Other long term (current) drug therapy: Secondary | ICD-10-CM | POA: Insufficient documentation

## 2023-01-09 DIAGNOSIS — E559 Vitamin D deficiency, unspecified: Secondary | ICD-10-CM | POA: Diagnosis not present

## 2023-01-09 DIAGNOSIS — M0579 Rheumatoid arthritis with rheumatoid factor of multiple sites without organ or systems involvement: Secondary | ICD-10-CM | POA: Diagnosis not present

## 2023-01-09 DIAGNOSIS — E079 Disorder of thyroid, unspecified: Secondary | ICD-10-CM | POA: Diagnosis not present

## 2023-01-09 MED ORDER — DIPHENHYDRAMINE HCL 25 MG PO CAPS: 25.0000 mg | ORAL_CAPSULE | Freq: Once | ORAL | Status: DC

## 2023-01-09 MED ORDER — ACETAMINOPHEN 325 MG PO TABS: 650.0000 mg | ORAL_TABLET | Freq: Once | ORAL | Status: DC

## 2023-01-09 MED ORDER — SODIUM CHLORIDE 0.9 % IV SOLN
1000.0000 mg | Freq: Once | INTRAVENOUS | Status: AC
  Administered 2023-01-09: 1000 mg via INTRAVENOUS
  Filled 2023-01-09: qty 40

## 2023-01-09 NOTE — Progress Notes (Signed)
Diagnosis: Rheumatoid Arthritis  Provider:  Pollyann Savoy MD  Procedure: IV Infusion  IV Type: Peripheral, IV Location: L Antecubital  Orencia (Abatacept), Dose: 1000 mg  Infusion Start Time: 0934  Infusion Stop Time: 1006  Post Infusion IV Care: Peripheral IV Discontinued  Discharge: Condition: Good, Destination: Home . AVS Provided  Performed by:  Arrie Senate, RN

## 2023-01-10 LAB — CBC
HCT: 40.2 % (ref 35.0–45.0)
Hemoglobin: 13.4 g/dL (ref 11.7–15.5)
MCH: 32.8 pg (ref 27.0–33.0)
MCHC: 33.3 g/dL (ref 32.0–36.0)
MCV: 98.3 fL (ref 80.0–100.0)
MPV: 9.2 fL (ref 7.5–12.5)
Platelets: 291 10*3/uL (ref 140–400)
RBC: 4.09 10*6/uL (ref 3.80–5.10)
RDW: 11.7 % (ref 11.0–15.0)
WBC: 5.3 10*3/uL (ref 3.8–10.8)

## 2023-01-10 LAB — COMPREHENSIVE METABOLIC PANEL
AG Ratio: 1.8 (calc) (ref 1.0–2.5)
ALT: 7 U/L (ref 6–29)
AST: 14 U/L (ref 10–35)
Albumin: 4.2 g/dL (ref 3.6–5.1)
Alkaline phosphatase (APISO): 70 U/L (ref 37–153)
BUN: 14 mg/dL (ref 7–25)
CO2: 29 mmol/L (ref 20–32)
Calcium: 9.1 mg/dL (ref 8.6–10.4)
Chloride: 102 mmol/L (ref 98–110)
Creat: 0.85 mg/dL (ref 0.60–0.95)
Globulin: 2.3 g/dL (ref 1.9–3.7)
Glucose, Bld: 70 mg/dL (ref 65–99)
Potassium: 3.9 mmol/L (ref 3.5–5.3)
Sodium: 139 mmol/L (ref 135–146)
Total Bilirubin: 0.5 mg/dL (ref 0.2–1.2)
Total Protein: 6.5 g/dL (ref 6.1–8.1)

## 2023-01-10 LAB — VITAMIN D 25 HYDROXY (VIT D DEFICIENCY, FRACTURES): Vit D, 25-Hydroxy: 38 ng/mL (ref 30–100)

## 2023-01-10 LAB — PHOSPHORUS: Phosphorus: 3.5 mg/dL (ref 2.1–4.3)

## 2023-01-10 LAB — TSH: TSH: 1.22 m[IU]/L (ref 0.40–4.50)

## 2023-01-10 LAB — PTH, INTACT AND CALCIUM
Calcium: 9.1 mg/dL (ref 8.6–10.4)
PTH: 57 pg/mL (ref 16–77)

## 2023-01-12 ENCOUNTER — Telehealth: Payer: Self-pay | Admitting: Pharmacist

## 2023-01-12 NOTE — Progress Notes (Signed)
Labs wnl to proceed with weekly alendronate and continue Orencia IV infusions as prescribed  Chesley Mires, PharmD, MPH, BCPS, CPP Clinical Pharmacist (Rheumatology and Pulmonology)\

## 2023-01-12 NOTE — Telephone Encounter (Signed)
Labs from Orencia infusion for osteoporosis wnl. Ok to initiate alendronate.  Discussed with pt.  She already picked up alendronate from pharmacy. She has been advised to initiate alendronate 70mg  p.o. once weekly. She will take on weekends. Reviewed that there is no interaction with her current med list but she needs to take in AM and stay upright with no food/drink intake (other than water) for up to hour after administration  Chesley Mires, PharmD, MPH, BCPS, CPP Clinical Pharmacist (Rheumatology and Pulmonology)

## 2023-01-25 ENCOUNTER — Ambulatory Visit: Payer: Medicare Other | Admitting: Physician Assistant

## 2023-01-25 ENCOUNTER — Other Ambulatory Visit: Payer: Self-pay | Admitting: Internal Medicine

## 2023-01-28 ENCOUNTER — Encounter: Payer: Self-pay | Admitting: Internal Medicine

## 2023-01-29 MED ORDER — STIOLTO RESPIMAT 2.5-2.5 MCG/ACT IN AERS: 2.0000 | INHALATION_SPRAY | Freq: Every day | RESPIRATORY_TRACT | 5 refills | Status: DC

## 2023-02-06 ENCOUNTER — Other Ambulatory Visit: Payer: Self-pay | Admitting: Rheumatology

## 2023-02-06 ENCOUNTER — Encounter (HOSPITAL_COMMUNITY): Payer: Medicare Other | Attending: Rheumatology | Admitting: *Deleted

## 2023-02-06 ENCOUNTER — Encounter (HOSPITAL_COMMUNITY): Payer: Medicare Other

## 2023-02-06 VITALS — BP 136/65 | HR 79 | Temp 97.5°F | Resp 18

## 2023-02-06 DIAGNOSIS — M0579 Rheumatoid arthritis with rheumatoid factor of multiple sites without organ or systems involvement: Secondary | ICD-10-CM | POA: Diagnosis not present

## 2023-02-06 MED ORDER — DIPHENHYDRAMINE HCL 25 MG PO CAPS: 25.0000 mg | ORAL_CAPSULE | Freq: Once | ORAL | Status: DC

## 2023-02-06 MED ORDER — ACETAMINOPHEN 325 MG PO TABS: 650.0000 mg | ORAL_TABLET | Freq: Once | ORAL | Status: DC

## 2023-02-06 MED ORDER — SODIUM CHLORIDE 0.9 % IV SOLN
1000.0000 mg | Freq: Once | INTRAVENOUS | Status: AC
  Administered 2023-02-06: 1000 mg via INTRAVENOUS
  Filled 2023-02-06: qty 40

## 2023-02-06 NOTE — Progress Notes (Signed)
Diagnosis: Rheumatoid Arthritis  Provider:  Pollyann Savoy MD  Procedure: IV Infusion  IV Type: Peripheral, IV Location: L Antecubital  Orencia (Abatacept), Dose: 1000 mg  Infusion Start Time: 0941  Infusion Stop Time: 1011  Post Infusion IV Care: Observation period completed  Discharge: Condition: Good, Destination: Home . AVS Provided  Performed by:  Daleen Squibb, RN

## 2023-02-06 NOTE — Telephone Encounter (Signed)
Last Fill: 05/29/2022  Next Visit: 06/07/2023  Last Visit: 01/03/2023  Dx: Primary osteoarthritis of both knees   Current Dose per office note on 01/03/2023: not discussed  Okay to refill Voltaren Gel?

## 2023-02-09 ENCOUNTER — Ambulatory Visit: Payer: Medicare Other | Admitting: Rheumatology

## 2023-02-16 NOTE — Progress Notes (Signed)
HPI female never smoker followed for asthma, allergic rhinitis, complicated by rheumatoid arthritis PFT 02/02/09-mild obstruction small airways, minimal response bronchodilator, DLCO mildly reduced. FVC 3.36/116%, FEV1 2.48/119%, ratio 0.74, FEF 25-75% 1.80/75%, TLC 108%, DLCO 70% Office Spirometry 09/08/16-WNL-FVC 2.63/98%, FEV1 1.91/95%, ratio 0.73, FEF 25-75% 1.32/82% FENO 02/12/17- 32H ------------------------------------------------------------------------------------- 02/17/22- 82 year old female never smoker followed for Asthmatic Bronchitis, chronic cough, Allergic Rhinitis, complicated by Rheumatoid Arthritis, Osteoarthritis, Depression, HTN,  -Trelegy, Ventolin hfa       Orencia and Plaquenil for RA Covid vax- 3 Moderna Flu vax-  ACT 23 Occasional cough drops seems to be adequate for cough control beyond her inhalers.  She has had no increased cough or change in chronic dyspnea on exertion and no acute issues.  Likes Trelegy.  02/19/23- 82 year old female never smoker followed for Asthmatic Bronchitis, chronic cough, Allergic Rhinitis, complicated by Rheumatoid Arthritis, Osteoarthritis, Depression, HTN,  -Trelegy> Stiolto, Ventolin hfa       Orencia and Plaquenil for RA LOV Dr Alex Gardener 8/29 for recent onset hoarseness.> replaced Trelgy with Stiolto. Suggested ENT. ------Hoarseness has resolved. Coughing and wheezy Flu vax today senior Reports increased cough and wheeze since changing Trelegy to [employer]. Using rescue inhaler 2-3x/day.  ROS-see HPI    + = positive Constitutional:   No-   weight loss, night sweats, fevers, chills, fatigue, lassitude. HEENT:   No-  headaches, difficulty swallowing, tooth/dental problems, sore throat,       No-  sneezing, itching, ear ache, nasal congestion, +post nasal drip,  CV:  No-   chest pain, orthopnea, PND, +swelling in lower extremities, anasarca, dizziness, palpitations Resp:   + shortness of breath with exertion or at rest.              No-    productive cough,   +non-productive cough,  No- coughing up of blood.              No-   change in color of mucus.  + wheezing.   Skin: No-   rash or lesions. GI:  No-   heartburn, indigestion, abdominal pain, nausea, vomiting,  GU:  MS: + joint pain or swelling.   Neuro-     nothing unusual Psych:  No- change in mood or affect. No depression or anxiety.  No memory loss.  OBJ        General- Alert, Oriented, Affect-appropriate, Distress- none acute. + Morbidly obese Skin- rash-none, lesions- none, excoriation- none Lymphadenopathy- none Head- atraumatic            Eyes- Gross vision intact, PERRLA, conjunctivae clear secretions            Ears- Hearing, canals-normal            Nose- Clear, no-Septal dev, mucus, polyps, erosion, perforation             Throat- Mallampati III , mucosa clear , drainage- none, tonsils- atrophic. +throat clearing Neck- flexible , trachea midline, no stridor , thyroid nl, carotid no bruit Chest - symmetrical excursion , unlabored           Heart/CV- RRR , no murmur , no gallop  , no rub, nl s1 s2                           - JVD- none , edema- none, stasis changes- none, varices- none           Lung- clear, wheeze- none, cough- none , dullness-none, rub- none  Chest wall-  Abd-  Br/ Gen/ Rectal- Not done, not indicated Extrem- cyanosis- none, clubbing, none, atrophy- none, strength- nl Neuro- grossly intact to observation

## 2023-02-19 ENCOUNTER — Ambulatory Visit: Payer: Medicare Other | Admitting: Internal Medicine

## 2023-02-19 ENCOUNTER — Encounter: Payer: Self-pay | Admitting: Internal Medicine

## 2023-02-19 VITALS — BP 133/84 | HR 80 | Temp 98.4°F | Ht 61.5 in | Wt 213.4 lb

## 2023-02-19 DIAGNOSIS — J4531 Mild persistent asthma with (acute) exacerbation: Secondary | ICD-10-CM | POA: Diagnosis not present

## 2023-02-19 DIAGNOSIS — Z23 Encounter for immunization: Secondary | ICD-10-CM | POA: Diagnosis not present

## 2023-02-19 DIAGNOSIS — J4540 Moderate persistent asthma, uncomplicated: Secondary | ICD-10-CM

## 2023-02-19 DIAGNOSIS — M0579 Rheumatoid arthritis with rheumatoid factor of multiple sites without organ or systems involvement: Secondary | ICD-10-CM

## 2023-02-19 MED ORDER — BREZTRI AEROSPHERE 160-9-4.8 MCG/ACT IN AERO: 2.0000 | INHALATION_SPRAY | Freq: Two times a day (BID) | RESPIRATORY_TRACT | Status: DC

## 2023-02-19 NOTE — Patient Instructions (Signed)
Order- sample x 2 Breztri    inhale 2 puffs then rinse mouth, twice daily. It may help to reduce hoarseness from the steroid component if you use the Breztri before meals ( breakfast and supper). That way eating and swallowing can help clear medicine from your throat.  Order- Flu vax senior

## 2023-03-06 ENCOUNTER — Encounter: Payer: Medicare Other | Admitting: Internal Medicine

## 2023-03-06 ENCOUNTER — Other Ambulatory Visit: Payer: Self-pay | Admitting: Internal Medicine

## 2023-03-06 VITALS — BP 138/82 | HR 75 | Temp 97.9°F | Resp 18

## 2023-03-06 DIAGNOSIS — M0579 Rheumatoid arthritis with rheumatoid factor of multiple sites without organ or systems involvement: Secondary | ICD-10-CM

## 2023-03-06 MED ORDER — SODIUM CHLORIDE 0.9 % IV SOLN
1000.0000 mg | Freq: Once | INTRAVENOUS | Status: AC
  Administered 2023-03-06: 1000 mg via INTRAVENOUS
  Filled 2023-03-06: qty 40

## 2023-03-06 MED ORDER — DIPHENHYDRAMINE HCL 25 MG PO CAPS: 25.0000 mg | ORAL_CAPSULE | Freq: Once | ORAL | Status: DC

## 2023-03-06 MED ORDER — ACETAMINOPHEN 325 MG PO TABS: 650.0000 mg | ORAL_TABLET | Freq: Once | ORAL | Status: DC

## 2023-03-06 NOTE — Progress Notes (Signed)
Diagnosis: Rheumatoid Arthritis  Provider:  Pollyann Savoy MD  Procedure: IV Infusion  IV Type: Peripheral, IV Location: L Antecubital  Orencia (Abatacept), Dose: 1000 mg  Infusion start time: 1033  Infusion Stop Time: 1106  Post Infusion IV Care: Observation period completed  Discharge: Condition: Good, Destination: Home . AVS Provided  Performed by:  Feliberto Harts, LPN

## 2023-03-06 NOTE — Progress Notes (Unsigned)
Diagnosis: Rheumatoid Arthritis  Provider:  Pollyann Savoy MD  Procedure: IV Infusion  IV Type: Peripheral, IV Location: L Antecubital  Orencia (Abatacept), Dose: 1000 mg  Infusion Start Time: 1033  Infusion Stop Time: 1106  Post Infusion IV Care: Observation period completed  Discharge: Condition: Good, Destination: Home . AVS Provided  Performed by:  Feliberto Harts, LPN

## 2023-03-07 LAB — CBC WITH DIFFERENTIAL/PLATELET
Absolute Lymphocytes: 1773 {cells}/uL (ref 850–3900)
Absolute Monocytes: 474 {cells}/uL (ref 200–950)
Basophils Absolute: 32 {cells}/uL (ref 0–200)
Basophils Relative: 0.5 %
Eosinophils Absolute: 160 {cells}/uL (ref 15–500)
Eosinophils Relative: 2.5 %
HCT: 38.3 % (ref 35.0–45.0)
Hemoglobin: 12.6 g/dL (ref 11.7–15.5)
MCH: 32.3 pg (ref 27.0–33.0)
MCHC: 32.9 g/dL (ref 32.0–36.0)
MCV: 98.2 fL (ref 80.0–100.0)
MPV: 9.2 fL (ref 7.5–12.5)
Monocytes Relative: 7.4 %
Neutro Abs: 3962 {cells}/uL (ref 1500–7800)
Neutrophils Relative %: 61.9 %
Platelets: 281 10*3/uL (ref 140–400)
RBC: 3.9 10*6/uL (ref 3.80–5.10)
RDW: 11.7 % (ref 11.0–15.0)
Total Lymphocyte: 27.7 %
WBC: 6.4 10*3/uL (ref 3.8–10.8)

## 2023-03-07 LAB — COMPREHENSIVE METABOLIC PANEL
AG Ratio: 1.7 (calc) (ref 1.0–2.5)
ALT: 8 U/L (ref 6–29)
AST: 15 U/L (ref 10–35)
Albumin: 4 g/dL (ref 3.6–5.1)
Alkaline phosphatase (APISO): 55 U/L (ref 37–153)
BUN: 15 mg/dL (ref 7–25)
CO2: 27 mmol/L (ref 20–32)
Calcium: 8.9 mg/dL (ref 8.6–10.4)
Chloride: 103 mmol/L (ref 98–110)
Creat: 0.73 mg/dL (ref 0.60–0.95)
Globulin: 2.3 g/dL (ref 1.9–3.7)
Glucose, Bld: 77 mg/dL (ref 65–99)
Potassium: 3.9 mmol/L (ref 3.5–5.3)
Sodium: 140 mmol/L (ref 135–146)
Total Bilirubin: 0.5 mg/dL (ref 0.2–1.2)
Total Protein: 6.3 g/dL (ref 6.1–8.1)

## 2023-03-07 NOTE — Progress Notes (Signed)
CBC and CMP are normal.

## 2023-03-10 ENCOUNTER — Encounter: Payer: Self-pay | Admitting: Internal Medicine

## 2023-03-10 NOTE — Assessment & Plan Note (Signed)
Problem with Trelegy was hoarseness/ steroid effect.  Plan- try Breztri, add spacer if needed. Flu vax

## 2023-03-10 NOTE — Assessment & Plan Note (Signed)
Plan- watching impact of immunomodulation on respiratory status

## 2023-03-26 ENCOUNTER — Other Ambulatory Visit: Payer: Self-pay | Admitting: Rheumatology

## 2023-03-26 DIAGNOSIS — M0579 Rheumatoid arthritis with rheumatoid factor of multiple sites without organ or systems involvement: Secondary | ICD-10-CM

## 2023-03-26 NOTE — Telephone Encounter (Signed)
Last Fill: 01/03/2023  Eye exam: 11/15/2022 WNL     Labs: 03/06/2023 CBC and CMP are normal.   Next Visit: 06/07/2023  Last Visit: 01/03/2023  DX: Rheumatoid arthritis with rheumatoid factor of multiple sites without organ or systems involvement   Current Dose per office note 01/03/2023: Plaquenil 200 mg in the morning and 100 mg at bedtime.   Okay to refill Plaquenil?

## 2023-04-03 ENCOUNTER — Encounter: Payer: Medicare Other | Attending: Rheumatology | Admitting: *Deleted

## 2023-04-03 VITALS — BP 138/84 | HR 64 | Temp 97.8°F | Resp 18

## 2023-04-03 DIAGNOSIS — M0579 Rheumatoid arthritis with rheumatoid factor of multiple sites without organ or systems involvement: Secondary | ICD-10-CM | POA: Diagnosis not present

## 2023-04-03 MED ORDER — DIPHENHYDRAMINE HCL 25 MG PO CAPS: 25.0000 mg | ORAL_CAPSULE | Freq: Once | ORAL | Status: DC

## 2023-04-03 MED ORDER — SODIUM CHLORIDE 0.9 % IV SOLN
1000.0000 mg | Freq: Once | INTRAVENOUS | Status: AC
  Administered 2023-04-03: 1000 mg via INTRAVENOUS
  Filled 2023-04-03: qty 40

## 2023-04-03 MED ORDER — ACETAMINOPHEN 325 MG PO TABS: 650.0000 mg | ORAL_TABLET | Freq: Once | ORAL | Status: DC

## 2023-04-03 NOTE — Progress Notes (Signed)
Diagnosis: Rheumatoid Arthritis  Provider:  Pollyann Savoy MD  Procedure: IV Infusion  IV Type: Peripheral, IV Location: L Antecubital  Orencia (Abatacept), Dose: 1000 mg  Infusion Start Time: 0952  Infusion Stop Time: 1025  Post Infusion IV Care: Observation period completed  Discharge: Condition: Good, Destination: Home . AVS Provided  Performed by:  Daleen Squibb, RN

## 2023-04-06 ENCOUNTER — Other Ambulatory Visit: Payer: Self-pay

## 2023-04-16 DIAGNOSIS — I10 Essential (primary) hypertension: Secondary | ICD-10-CM | POA: Diagnosis not present

## 2023-04-16 DIAGNOSIS — E7800 Pure hypercholesterolemia, unspecified: Secondary | ICD-10-CM | POA: Diagnosis not present

## 2023-04-16 DIAGNOSIS — M810 Age-related osteoporosis without current pathological fracture: Secondary | ICD-10-CM | POA: Diagnosis not present

## 2023-04-16 DIAGNOSIS — Z6838 Body mass index (BMI) 38.0-38.9, adult: Secondary | ICD-10-CM | POA: Diagnosis not present

## 2023-04-16 DIAGNOSIS — F3341 Major depressive disorder, recurrent, in partial remission: Secondary | ICD-10-CM | POA: Diagnosis not present

## 2023-04-16 DIAGNOSIS — R7303 Prediabetes: Secondary | ICD-10-CM | POA: Diagnosis not present

## 2023-04-22 ENCOUNTER — Other Ambulatory Visit: Payer: Self-pay | Admitting: Rheumatology

## 2023-04-22 DIAGNOSIS — M810 Age-related osteoporosis without current pathological fracture: Secondary | ICD-10-CM

## 2023-04-23 NOTE — Telephone Encounter (Signed)
Last Fill: 01/03/2023  Labs: 03/06/2023 CBC and CMP are normal.   Next Visit: 06/07/2023  Last Visit: 01/03/2023  DX: Age-related osteoporosis without current pathological fracture   Current Dose per office note on 01/03/2023:  Plan: alendronate (FOSAMAX) 70 MG tablet weekly with full glass of water was discussed   Okay to refill Fosamax?

## 2023-05-04 ENCOUNTER — Telehealth: Payer: Self-pay | Admitting: Pharmacist

## 2023-05-04 NOTE — Telephone Encounter (Signed)
Called patient to confirm if any anticipated insurance changes in 2025. Patient confirms no changes to insurance in 2025. She will continue to have Medicare and BCBS supplement  Medication: IV Orencia   Medicare covers 80% of the infusion and no authorization is required, and the supplement would cover the 20% of the cost that was not paid for by Medicare as long as Medicare covered the medication?after patient pays her Medicare B deductible.?   Chesley Mires, PharmD, MPH, BCPS, CPP Clinical Pharmacist (Rheumatology and Pulmonology)

## 2023-05-08 ENCOUNTER — Other Ambulatory Visit: Payer: Self-pay | Admitting: Emergency Medicine

## 2023-05-08 ENCOUNTER — Encounter: Payer: Medicare Other | Attending: Rheumatology | Admitting: *Deleted

## 2023-05-08 VITALS — BP 122/87 | HR 80 | Temp 97.6°F | Resp 16

## 2023-05-08 DIAGNOSIS — M0579 Rheumatoid arthritis with rheumatoid factor of multiple sites without organ or systems involvement: Secondary | ICD-10-CM | POA: Insufficient documentation

## 2023-05-08 MED ORDER — ACETAMINOPHEN 325 MG PO TABS
650.0000 mg | ORAL_TABLET | Freq: Once | ORAL | Status: DC
  Filled 2023-05-08: qty 2

## 2023-05-08 MED ORDER — SODIUM CHLORIDE 0.9 % IV SOLN
1000.0000 mg | Freq: Once | INTRAVENOUS | Status: AC
  Administered 2023-05-08: 1000 mg via INTRAVENOUS
  Filled 2023-05-08: qty 40

## 2023-05-08 MED ORDER — DIPHENHYDRAMINE HCL 25 MG PO CAPS
25.0000 mg | ORAL_CAPSULE | Freq: Once | ORAL | Status: DC
Start: 2023-05-08 — End: 2023-05-08
  Filled 2023-05-08: qty 1

## 2023-05-08 NOTE — Progress Notes (Signed)
error 

## 2023-05-08 NOTE — Progress Notes (Deleted)
error 

## 2023-05-08 NOTE — Progress Notes (Signed)
 Diagnosis: Rheumatoid Arthritis  Provider:  Dolphus Reiter MD  Procedure: IV Infusion  IV Type: Peripheral, IV Location: L Antecubital  Orencia  (Abatacept ), Dose: 1000 mg  Infusion Start Time: 0945  Infusion Stop Time: 1015  Post Infusion IV Care: Observation period completed  Discharge: Condition: Good, Destination: Home . AVS Provided  Performed by:  Baldwin Darice Helling, RN

## 2023-05-09 LAB — CBC WITH DIFFERENTIAL/PLATELET
Absolute Lymphocytes: 1981 {cells}/uL (ref 850–3900)
Absolute Monocytes: 539 {cells}/uL (ref 200–950)
Basophils Absolute: 49 {cells}/uL (ref 0–200)
Basophils Relative: 0.7 %
Eosinophils Absolute: 280 {cells}/uL (ref 15–500)
Eosinophils Relative: 4 %
HCT: 39 % (ref 35.0–45.0)
Hemoglobin: 12.9 g/dL (ref 11.7–15.5)
MCH: 32.6 pg (ref 27.0–33.0)
MCHC: 33.1 g/dL (ref 32.0–36.0)
MCV: 98.5 fL (ref 80.0–100.0)
MPV: 9 fL (ref 7.5–12.5)
Monocytes Relative: 7.7 %
Neutro Abs: 4151 {cells}/uL (ref 1500–7800)
Neutrophils Relative %: 59.3 %
Platelets: 305 10*3/uL (ref 140–400)
RBC: 3.96 10*6/uL (ref 3.80–5.10)
RDW: 11.5 % (ref 11.0–15.0)
Total Lymphocyte: 28.3 %
WBC: 7 10*3/uL (ref 3.8–10.8)

## 2023-05-09 LAB — COMPREHENSIVE METABOLIC PANEL
AG Ratio: 1.6 (calc) (ref 1.0–2.5)
ALT: 10 U/L (ref 6–29)
AST: 17 U/L (ref 10–35)
Albumin: 4.1 g/dL (ref 3.6–5.1)
Alkaline phosphatase (APISO): 54 U/L (ref 37–153)
BUN: 17 mg/dL (ref 7–25)
CO2: 27 mmol/L (ref 20–32)
Calcium: 9.3 mg/dL (ref 8.6–10.4)
Chloride: 102 mmol/L (ref 98–110)
Creat: 0.8 mg/dL (ref 0.60–0.95)
Globulin: 2.5 g/dL (ref 1.9–3.7)
Glucose, Bld: 105 mg/dL — ABNORMAL HIGH (ref 65–99)
Potassium: 3.7 mmol/L (ref 3.5–5.3)
Sodium: 140 mmol/L (ref 135–146)
Total Bilirubin: 0.5 mg/dL (ref 0.2–1.2)
Total Protein: 6.6 g/dL (ref 6.1–8.1)

## 2023-05-09 NOTE — Progress Notes (Signed)
 HPI female never smoker followed for asthma, allergic rhinitis, complicated by rheumatoid arthritis PFT 02/02/09-mild obstruction small airways, minimal response bronchodilator, DLCO mildly reduced. FVC 3.36/116%, FEV1 2.48/119%, ratio 0.74, FEF 25-75% 1.80/75%, TLC 108%, DLCO 70% Office Spirometry 09/08/16-WNL-FVC 2.63/98%, FEV1 1.91/95%, ratio 0.73, FEF 25-75% 1.32/82% FENO 02/12/17- 32H -------------------------------------------------------------------------------------   02/19/23- 83 year old female never smoker followed for Asthmatic Bronchitis, chronic cough, Allergic Rhinitis, complicated by Rheumatoid Arthritis, Osteoarthritis, Depression, HTN,  -Trelegy> Stiolto, Ventolin  hfa       Orencia  and Plaquenil  for RA LOV Dr Arby 8/29 for recent onset hoarseness.> replaced Trelgy with Stiolto. Suggested ENT. ------Hoarseness has resolved. Coughing and wheezy Flu vax today senior Reports increased cough and wheeze since changing Trelegy to [employer]. Using rescue inhaler 2-3x/day.  05/10/23- 83 year old female never smoker followed for Asthmatic Bronchitis, chronic cough, Allergic Rhinitis, complicated by Rheumatoid Arthritis, Osteoarthritis, Depression, HTN,  -Trelegy> Stiolto, Ventolin  hfa       Orencia  and Plaquenil  for RA We gave Breztri  samples to use before meals at last  visit since Trelegy caused hoarseness and Stiolto not as effective. Body weight today- 214 lbs Rheumatology manages her RA. Discussed the use of AI scribe software for clinical note transcription with the patient, who gave verbal consent to proceed.  History of Present Illness   The patient, with a history of asthma and rheumatoid arthritis, presents with hoarseness and increased coughing and wheezing. The patient has been on Trelegy, an inhaler for asthma, but switched to Stiolto due to hoarseness. However, the patient reported that Stiolto did not control their asthma as well, leading to increased coughing and wheezing.  The patient then tried Breztri , but was concerned about the steroid component in the inhaler, which they believed was causing the hoarseness. The patient also noted that they are on Orencia , an infusion medicine for rheumatoid arthritis, which may be affecting their immune system. The patient expressed concern about potential side effects of the inhalers, including osteoporosis, depression, and high blood pressure. The patient also reported having difficulty sleeping due to their symptoms and has been researching their condition and treatments online. We had long discussion of inhaler components. We are going to try Symbicort / Spiriva  with her spacer tube.     ROS-see HPI    + = positive Constitutional:   No-   weight loss, night sweats, fevers, chills, fatigue, lassitude. HEENT:   No-  headaches, difficulty swallowing, tooth/dental problems, sore throat,       No-  sneezing, itching, ear ache, nasal congestion, +post nasal drip,  CV:  No-   chest pain, orthopnea, PND, +swelling in lower extremities, anasarca, dizziness, palpitations Resp:   + shortness of breath with exertion or at rest.              No-   productive cough,   +non-productive cough,  No- coughing up of blood.              No-   change in color of mucus.  + wheezing.   Skin: No-   rash or lesions. GI:  No-   heartburn, indigestion, abdominal pain, nausea, vomiting,  GU:  MS: + joint pain or swelling.   Neuro-     nothing unusual Psych:  No- change in mood or affect. No depression or anxiety.  No memory loss.  OBJ        General- Alert, Oriented, Affect-appropriate, Distress- none acute. + Morbidly obese Skin- rash-none, lesions- none, excoriation- none Lymphadenopathy- none Head- atraumatic  Eyes- Gross vision intact, PERRLA, conjunctivae clear secretions            Ears- Hearing, canals-normal            Nose- Clear, no-Septal dev, mucus, polyps, erosion, perforation             Throat- Mallampati III , mucosa  clear , drainage- none, tonsils- atrophic.  Neck- flexible , trachea midline, no stridor , thyroid  nl, carotid no bruit Chest - symmetrical excursion , unlabored           Heart/CV- RRR , no murmur , no gallop  , no rub, nl s1 s2                           - JVD- none , edema- none, stasis changes- none, varices- none           Lung-  wheeze- none, +cough- mild loose , dullness-none, rub- none           Chest wall-  Abd-  Br/ Gen/ Rectal- Not done, not indicated Extrem- cyanosis- none, clubbing, none, atrophy- none, strength- nl Neuro- grossly intact to observation  Assessment and Plan    Asthma Increased coughing and wheezing after switching from Trelegy to [employer]. Hoarseness improved with Stiolto. Patient tried Breztri  but was concerned about the steroid component. Patient prefers Trelegy but is concerned about hoarseness. -Start Symbicort  (2 puffs twice a day) and Spiriva  (1 puff daily). -Use a spacer tube with Symbicort  to reduce hoarseness. -Rinse mouth after using inhalers. -Consider biologic therapy if current regimen is not effective.  RSV Vaccination Patient was asked about RSV vaccination by another provider. -Discussed the new RSV vaccine and its potential benefits. -Decision on vaccination deferred to future visit.  Follow-up in 4 months or sooner if needed.

## 2023-05-09 NOTE — Progress Notes (Signed)
 CBC and CMP are within normal limits.

## 2023-05-10 ENCOUNTER — Encounter: Payer: Self-pay | Admitting: Internal Medicine

## 2023-05-10 ENCOUNTER — Ambulatory Visit: Payer: Medicare Other | Admitting: Internal Medicine

## 2023-05-10 VITALS — BP 124/76 | HR 78 | Ht 61.5 in | Wt 214.8 lb

## 2023-05-10 DIAGNOSIS — J45909 Unspecified asthma, uncomplicated: Secondary | ICD-10-CM | POA: Diagnosis not present

## 2023-05-10 DIAGNOSIS — J4530 Mild persistent asthma, uncomplicated: Secondary | ICD-10-CM

## 2023-05-10 MED ORDER — BUDESONIDE-FORMOTEROL FUMARATE 160-4.5 MCG/ACT IN AERO: INHALATION_SPRAY | RESPIRATORY_TRACT | 6 refills | Status: DC

## 2023-05-10 MED ORDER — SPIRIVA RESPIMAT 1.25 MCG/ACT IN AERS: INHALATION_SPRAY | RESPIRATORY_TRACT | 12 refills | Status: DC

## 2023-05-10 NOTE — Patient Instructions (Signed)
 We are replacing Trelegy, Stiolto and [employer]-  Scripts sent for  Symbicort- inhale 2 puffs through spacer tube, twice daily Spiriva- inale 2 puffs, twice daily Then rinse mouth

## 2023-05-14 ENCOUNTER — Telehealth: Payer: Self-pay

## 2023-05-14 ENCOUNTER — Other Ambulatory Visit: Payer: Self-pay

## 2023-05-14 NOTE — Telephone Encounter (Signed)
 Auth Submission: NO AUTH NEEDED Site of care: Site of care: [facility] Payer: medicare a/b, bcbs supp Medication & CPT/J Code(s) submitted: Orencia  (Abatacept ) G9870 Route of submission (phone, fax, portal):  Phone # Fax # Auth type: Buy/Bill HB Units/visits requested: 1000mg , q4weeks Reference number:  Approval from: 05/14/23 to 05/07/24

## 2023-05-24 NOTE — Progress Notes (Signed)
Office Visit Note  Patient: Monica [name]             Date of Birth: February 07, 1941           MRN: [id number]             PCP: Soundra Pilon, FNP Referring: Soundra Pilon, FNP Visit Date: 06/07/2023 Occupation: @GUAROCC @  Subjective:  Intermittent joint pain  History of Present Illness: Monica [name] is a 83 y.o. female with seropositive rheumatoid arthritis, osteoarthritis and osteoporosis.  She states she continues to have pain and discomfort over bilateral CMC joints.  She has stiffness in her hands.  She has not noticed any joint swelling.  She has intermittent discomfort in her knee joints in her feet.  She has not noticed any swelling.  She has had no recurrence of trigger finger.  She has been taking Fosamax 70 mg p.o. weekly since her last visit on a regular basis.  She did not experience any side effects from Fosamax.    Activities of Daily Living:  Patient reports morning stiffness for 1-2 hours.   Patient Denies nocturnal pain.  Difficulty dressing/grooming: Denies Difficulty climbing stairs: Reports Difficulty getting out of chair: Reports Difficulty using hands for taps, buttons, cutlery, and/or writing: Denies  Review of Systems  Constitutional:  Positive for fatigue.  HENT:  Positive for mouth dryness. Negative for mouth sores.   Eyes:  Positive for dryness.  Respiratory:  Negative for shortness of breath.   Cardiovascular:  Negative for chest pain and palpitations.  Gastrointestinal:  Negative for blood in stool, constipation and diarrhea.  Endocrine: Negative for increased urination.  Genitourinary:  Negative for involuntary urination.  Musculoskeletal:  Positive for joint pain, gait problem, joint pain, muscle weakness and morning stiffness. Negative for joint swelling, myalgias, muscle tenderness and myalgias.  Skin:  Positive for rash. Negative for color change, hair loss and sensitivity to sunlight.  Allergic/Immunologic: Negative for susceptible to  infections.  Neurological:  Negative for dizziness and headaches.  Hematological:  Negative for swollen glands.  Psychiatric/Behavioral:  Positive for depressed mood and sleep disturbance. The patient is nervous/anxious.     PMFS History:  Patient Active Problem List   Diagnosis Date Noted   Asthmatic bronchitis 02/16/2019   Osteopenia 12/20/2016   History of depression 12/11/2016   High risk medication use 05/15/2016   Primary osteoarthritis of both hands 05/15/2016   Primary osteoarthritis of both feet 05/15/2016   Primary osteoarthritis of both knees 05/15/2016   Vitamin D deficiency 05/15/2016   History of hypertension 05/15/2016   History of hyperlipidemia 05/15/2016   Rheumatoid arthritis with rheumatoid factor of multiple sites without organ or systems involvement (HCC) 01/10/2016   Seasonal allergic rhinitis 10/03/2011   HYPERLIPIDEMIA 12/31/2008   Essential hypertension 12/31/2008   Mild persistent asthmatic bronchitis with exacerbation 12/31/2008    Past Medical History:  Diagnosis Date   Arthritis    Asthma    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, FEV1 119%   Hyperlipidemia    Hypertension     Family History  Problem Relation Age of Onset   Lung cancer Father    Alzheimer's disease Mother    Past Surgical History:  Procedure Laterality Date   CARPAL TUNNEL RELEASE     FOOT SURGERY     MOUTH SURGERY Right 05/13/2020   Social History   Social History Narrative   Not on file   Immunization History  Administered Date(s) Administered   [employer]  Quad(high Dose 65+) 02/13/2019, 02/18/2020, 02/17/2021   Fluad Trivalent(High Dose 65+) 02/19/2023   Influenza Nasal 02/29/2012   Influenza Split 03/02/2011   Influenza, High Dose Seasonal PF 02/12/2017, 02/12/2018   Influenza,inj,Quad PF,6+ Mos 02/25/2013, 02/23/2014   Influenza-Unspecified 03/15/2015   Moderna Sars-Covid-2 Vaccination 07/16/2019, 08/13/2019, 08/06/2020   Pneumococcal Conjugate-13 02/25/2013    Zoster Recombinant(Shingrix) 09/27/2021     Objective: Vital Signs: BP (!) 167/90 (BP Location: Left Arm, Patient Position: Sitting, Cuff Size: Normal)   Pulse 88   Resp 16   Ht 5' 1.5" (1.562 m)   Wt 215 lb (97.5 kg)   BMI 39.97 kg/m    Physical Exam Vitals and nursing note reviewed.  Constitutional:      Appearance: She is well-developed.  HENT:     Head: Normocephalic and atraumatic.  Eyes:     Conjunctiva/sclera: Conjunctivae normal.  Cardiovascular:     Rate and Rhythm: Normal rate and regular rhythm.     Heart sounds: Normal heart sounds.  Pulmonary:     Effort: Pulmonary effort is normal.     Breath sounds: Normal breath sounds.  Abdominal:     General: Bowel sounds are normal.     Palpations: Abdomen is soft.  Musculoskeletal:     Cervical back: Normal range of motion.  Lymphadenopathy:     Cervical: No cervical adenopathy.  Skin:    General: Skin is warm and dry.     Capillary Refill: Capillary refill takes less than 2 seconds.  Neurological:     Mental Status: She is alert and oriented to person, place, and time.  Psychiatric:        Behavior: Behavior normal.      Musculoskeletal Exam: Cervical spine was in good range of motion without any discomfort.  Shoulders, elbows, wrists, MCPs PIPs and DIPs with good range of motion.  She had bilateral CMC thickening and discomfort to palpation.  PIP and DIP thickening with no synovitis was noted.  Hip joints and knee joints with good range of motion without any warmth swelling or effusion.  She had pes cavus and dorsal spurs.  CDAI Exam: CDAI Score: -- Patient Global: --; Provider Global: -- Swollen: --; Tender: -- Joint Exam 06/07/2023   No joint exam has been documented for this visit   There is currently no information documented on the homunculus. Go to the Rheumatology activity and complete the homunculus joint exam.  Investigation: No additional findings.  Imaging: No results found.  Recent  Labs: Lab Results  Component Value Date   WBC 7.0 05/08/2023   HGB 12.9 05/08/2023   PLT 305 05/08/2023   NA 140 05/08/2023   K 3.7 05/08/2023   CL 102 05/08/2023   CO2 27 05/08/2023   GLUCOSE 105 (H) 05/08/2023   BUN 17 05/08/2023   CREATININE 0.80 05/08/2023   BILITOT 0.5 05/08/2023   ALKPHOS 56 11/14/2022   AST 17 05/08/2023   ALT 10 05/08/2023   PROT 6.6 05/08/2023   ALBUMIN 3.9 11/14/2022   CALCIUM 9.3 05/08/2023   GFRAA >60 01/27/2020   QFTBGOLD Negative 07/24/2016   QFTBGOLDPLUS Negative 05/30/2022    Speciality Comments: PLQ eye exam: 11/15/2022 WNL  @ [facility]. Follow up in 1 year. Orencia 1000 mg IV q 28days started on 09/2016  Prior therapy includes: Simponi Aria (inadequate response) Fosamax  January 03, 2023  Procedures:  No procedures performed Allergies: Arava [leflunomide] and Piroxicam   Assessment / Plan:     Visit Diagnoses: Rheumatoid  arthritis with rheumatoid factor of multiple sites without organ or systems involvement (HCC)-patient denies having a flare of rheumatoid arthritis.  She has been tolerating Orencia infusions and Plaquenil without any side effects.  She gives history of intermittent swelling in her hands but no synovitis was noted on the examination today.  She complains of discomfort over the [facility] joints.  High risk medication use - Orencia IV 1000 mg every 28 days (started May 2018), Plaquenil 200 mg in the morning and 100 mg at bedtime. PLQ eye exam: 11/15/2022.  Labs obtained on May 08, 2023 CBC and CMP were normal.  TB Gold was negative on May 30, 2022.  Will check TB Gold with her next labs.  Will also check lipid panel with her next labs.  Information on immunization was placed in the AVS.  She was advised to present Orencia if she develops an infection resume after the infection resolves.  Annual eye examination was advised.  Primary osteoarthritis of both hands-she has severe osteoarthritis of the bilateral hands  with PIP and DIP thickening and bilateral CMC thickening.  Joint protection was discussed.  I also offered CMC brace which she declined.  Trigger finger, right middle finger-no recent recurrence.  Primary osteoarthritis of both knees -she continues to have some discomfort in her knee joints.  No warmth swelling or effusion was noted.  Bilateral valgus deformity.  She ambulates with the help of a cane.  Primary osteoarthritis of both feet-she has bilateral pes cavus and dorsal spurs.  Patient denies any discomfort in her feet today.  No synovitis was noted.  Age-related osteoporosis without current pathological fracture - November 30, 2022 T-score -2.5, BMD 0.574 right femoral neck no comparison, right total femur -14%, left total femur -9%, AP spine no comparison.  Fosamax 70 mg p.o. weekly was started on January 03, 2023.  She has been tolerating medication well without any side effects.  Calcium rich diet and vitamin D was advised.  Vitamin D deficiency-vitamin D was 38 on January 09, 2023.  Other medical problems are listed as follows:  History of hyperlipidemia  History of hypertension-admit was elevated at 160/92 today.  Repeat blood pressure was 167/90.  She was advised to monitor pressure closely and follow-up with her PCP.  History of asthma  History of depression  Orders: No orders of the defined types were placed in this encounter.  No orders of the defined types were placed in this encounter.    Follow-Up Instructions: Return in about 5 months (around 11/05/2023) for Rheumatoid arthritis.   Pollyann Savoy, MD  Note - This record has been created using Animal nutritionist.  Chart creation errors have been sought, but may not always  have been located. Such creation errors do not reflect on  the standard of medical care.

## 2023-06-05 ENCOUNTER — Encounter: Payer: Medicare Other | Attending: Rheumatology | Admitting: Internal Medicine

## 2023-06-05 VITALS — BP 131/80 | HR 79 | Temp 97.3°F | Resp 16

## 2023-06-05 DIAGNOSIS — M19042 Primary osteoarthritis, left hand: Secondary | ICD-10-CM | POA: Insufficient documentation

## 2023-06-05 DIAGNOSIS — Z79899 Other long term (current) drug therapy: Secondary | ICD-10-CM | POA: Diagnosis not present

## 2023-06-05 DIAGNOSIS — Z8709 Personal history of other diseases of the respiratory system: Secondary | ICD-10-CM | POA: Insufficient documentation

## 2023-06-05 DIAGNOSIS — M0579 Rheumatoid arthritis with rheumatoid factor of multiple sites without organ or systems involvement: Secondary | ICD-10-CM | POA: Insufficient documentation

## 2023-06-05 DIAGNOSIS — M65331 Trigger finger, right middle finger: Secondary | ICD-10-CM | POA: Insufficient documentation

## 2023-06-05 DIAGNOSIS — E559 Vitamin D deficiency, unspecified: Secondary | ICD-10-CM | POA: Diagnosis not present

## 2023-06-05 DIAGNOSIS — Z8639 Personal history of other endocrine, nutritional and metabolic disease: Secondary | ICD-10-CM | POA: Diagnosis not present

## 2023-06-05 DIAGNOSIS — M19041 Primary osteoarthritis, right hand: Secondary | ICD-10-CM | POA: Diagnosis not present

## 2023-06-05 DIAGNOSIS — Z8659 Personal history of other mental and behavioral disorders: Secondary | ICD-10-CM | POA: Insufficient documentation

## 2023-06-05 DIAGNOSIS — M810 Age-related osteoporosis without current pathological fracture: Secondary | ICD-10-CM | POA: Insufficient documentation

## 2023-06-05 DIAGNOSIS — M19072 Primary osteoarthritis, left ankle and foot: Secondary | ICD-10-CM | POA: Diagnosis not present

## 2023-06-05 DIAGNOSIS — Z8679 Personal history of other diseases of the circulatory system: Secondary | ICD-10-CM | POA: Diagnosis not present

## 2023-06-05 DIAGNOSIS — M170 Bilateral primary osteoarthritis of knee: Secondary | ICD-10-CM | POA: Insufficient documentation

## 2023-06-05 DIAGNOSIS — M19071 Primary osteoarthritis, right ankle and foot: Secondary | ICD-10-CM | POA: Diagnosis not present

## 2023-06-05 MED ORDER — ABATACEPT 250 MG IV SOLR
1000.0000 mg | Freq: Once | INTRAVENOUS | Status: AC
  Administered 2023-06-05: 1000 mg via INTRAVENOUS
  Filled 2023-06-05: qty 40

## 2023-06-05 NOTE — Progress Notes (Signed)
Diagnosis: Rheumatoid Arthritis  Provider:  Pollyann Savoy MD  Procedure: IV Infusion  IV Type: Peripheral, IV Location: L Antecubital  Orencia (Abatacept), Dose: 1000 mg  Infusion Start Time: 0935  Infusion Stop Time: 1005  Post Infusion IV Care: Observation period completed  Discharge: Condition: Good, Destination: Home . AVS Provided  Performed by:  Cleotilde Neer, LPN

## 2023-06-07 ENCOUNTER — Encounter: Payer: Self-pay | Admitting: Rheumatology

## 2023-06-07 ENCOUNTER — Encounter: Payer: Medicare Other | Admitting: Rheumatology

## 2023-06-07 VITALS — BP 167/90 | HR 88 | Resp 16 | Ht 61.5 in | Wt 215.0 lb

## 2023-06-07 DIAGNOSIS — Z8709 Personal history of other diseases of the respiratory system: Secondary | ICD-10-CM

## 2023-06-07 DIAGNOSIS — E559 Vitamin D deficiency, unspecified: Secondary | ICD-10-CM | POA: Diagnosis not present

## 2023-06-07 DIAGNOSIS — Z8639 Personal history of other endocrine, nutritional and metabolic disease: Secondary | ICD-10-CM

## 2023-06-07 DIAGNOSIS — M19041 Primary osteoarthritis, right hand: Secondary | ICD-10-CM | POA: Diagnosis not present

## 2023-06-07 DIAGNOSIS — M0579 Rheumatoid arthritis with rheumatoid factor of multiple sites without organ or systems involvement: Secondary | ICD-10-CM

## 2023-06-07 DIAGNOSIS — M170 Bilateral primary osteoarthritis of knee: Secondary | ICD-10-CM | POA: Diagnosis not present

## 2023-06-07 DIAGNOSIS — M65331 Trigger finger, right middle finger: Secondary | ICD-10-CM | POA: Diagnosis not present

## 2023-06-07 DIAGNOSIS — M19042 Primary osteoarthritis, left hand: Secondary | ICD-10-CM | POA: Diagnosis not present

## 2023-06-07 DIAGNOSIS — Z8679 Personal history of other diseases of the circulatory system: Secondary | ICD-10-CM | POA: Diagnosis not present

## 2023-06-07 DIAGNOSIS — Z79899 Other long term (current) drug therapy: Secondary | ICD-10-CM

## 2023-06-07 DIAGNOSIS — Z8659 Personal history of other mental and behavioral disorders: Secondary | ICD-10-CM

## 2023-06-07 DIAGNOSIS — M810 Age-related osteoporosis without current pathological fracture: Secondary | ICD-10-CM

## 2023-06-07 DIAGNOSIS — M19072 Primary osteoarthritis, left ankle and foot: Secondary | ICD-10-CM

## 2023-06-07 DIAGNOSIS — M19071 Primary osteoarthritis, right ankle and foot: Secondary | ICD-10-CM

## 2023-06-07 NOTE — Patient Instructions (Signed)
Vaccines You are taking a medication(s) that can suppress your immune system.  The following immunizations are recommended: Flu annually Covid-19  RSV Td/Tdap (tetanus, diphtheria, pertussis) every 10 years Pneumonia (Prevnar 15 then Pneumovax 23 at least 1 year apart.  Alternatively, can take Prevnar 20 without needing additional dose) Shingrix: 2 doses from 4 weeks to 6 months apart  Please check with your PCP to make sure you are up to date.   If you have signs or symptoms of an infection or start antibiotics: First, call your PCP for workup of your infection. Hold your medication through the infection, until you complete your antibiotics, and until symptoms resolve if you take the following: Injectable medication (Actemra, Benlysta, Cimzia, Cosentyx, Enbrel, Humira, Kevzara, Orencia, Remicade, Simponi, Stelara, Taltz, Tremfya) Methotrexate Leflunomide (Arava) Mycophenolate (Cellcept) Harriette Ohara, Olumiant, or Rinvoq

## 2023-06-25 ENCOUNTER — Other Ambulatory Visit: Payer: Self-pay | Admitting: Physician Assistant

## 2023-06-25 DIAGNOSIS — M0579 Rheumatoid arthritis with rheumatoid factor of multiple sites without organ or systems involvement: Secondary | ICD-10-CM

## 2023-06-25 NOTE — Telephone Encounter (Signed)
Last Fill: 03/26/2023  Eye exam:  11/15/2022 WNL   Labs: 05/08/2023 CBC and CMP are within normal limits.   Next Visit: 11/06/2023  Last Visit: 06/07/2023  [id number] arthritis with rheumatoid factor of multiple sites without organ or systems involvement   Current Dose per office note 06/07/2023: Plaquenil 200 mg in the morning and 100 mg at bedtime.   Okay to refill Plaquenil?

## 2023-06-28 ENCOUNTER — Other Ambulatory Visit: Payer: Self-pay | Admitting: Pharmacist

## 2023-06-28 ENCOUNTER — Encounter: Payer: Self-pay | Admitting: Rheumatology

## 2023-06-28 DIAGNOSIS — Z111 Encounter for screening for respiratory tuberculosis: Secondary | ICD-10-CM | POA: Insufficient documentation

## 2023-06-28 DIAGNOSIS — Z13220 Encounter for screening for lipoid disorders: Secondary | ICD-10-CM | POA: Insufficient documentation

## 2023-06-28 NOTE — Progress Notes (Signed)
Lipid panel and TB gold ordered to be drawn with upcoming IV Orencia infusion on 07/03/2023  Chesley Mires, PharmD, MPH, BCPS, CPP Clinical Pharmacist (Rheumatology and Pulmonology)

## 2023-07-03 ENCOUNTER — Other Ambulatory Visit: Payer: Self-pay | Admitting: Emergency Medicine

## 2023-07-03 ENCOUNTER — Encounter: Payer: Medicare Other | Attending: Rheumatology | Admitting: *Deleted

## 2023-07-03 VITALS — BP 112/91 | HR 78 | Temp 97.4°F

## 2023-07-03 DIAGNOSIS — Z111 Encounter for screening for respiratory tuberculosis: Secondary | ICD-10-CM | POA: Insufficient documentation

## 2023-07-03 DIAGNOSIS — Z79899 Other long term (current) drug therapy: Secondary | ICD-10-CM | POA: Insufficient documentation

## 2023-07-03 DIAGNOSIS — Z13220 Encounter for screening for lipoid disorders: Secondary | ICD-10-CM

## 2023-07-03 DIAGNOSIS — M0579 Rheumatoid arthritis with rheumatoid factor of multiple sites without organ or systems involvement: Secondary | ICD-10-CM

## 2023-07-03 DIAGNOSIS — Z7969 Long term (current) use of other immunomodulators and immunosuppressants: Secondary | ICD-10-CM | POA: Diagnosis not present

## 2023-07-03 DIAGNOSIS — E785 Hyperlipidemia, unspecified: Secondary | ICD-10-CM | POA: Diagnosis not present

## 2023-07-03 MED ORDER — SODIUM CHLORIDE 0.9 % IV SOLN
1000.0000 mg | Freq: Once | INTRAVENOUS | Status: AC
  Administered 2023-07-03: 1000 mg via INTRAVENOUS
  Filled 2023-07-03: qty 40

## 2023-07-03 NOTE — Progress Notes (Signed)
 Diagnosis: Rheumatoid Arthritis  Provider:  Pollyann Savoy MD  Procedure: IV Infusion  IV Type: Peripheral, IV Location: L Antecubital  Orencia (Abatacept), Dose: 1000 mg  Infusion Start Time: 0928  Infusion Stop Time: 1000  Post Infusion IV Care: Observation period completed  Discharge: Condition: Good, Destination: Home . AVS Provided  Performed by:  Daleen Squibb, RN

## 2023-07-06 LAB — CBC WITH DIFFERENTIAL/PLATELET
Absolute Lymphocytes: 1707 {cells}/uL (ref 850–3900)
Absolute Monocytes: 598 {cells}/uL (ref 200–950)
Basophils Absolute: 41 {cells}/uL (ref 0–200)
Basophils Relative: 0.6 %
Eosinophils Absolute: 163 {cells}/uL (ref 15–500)
Eosinophils Relative: 2.4 %
HCT: 40.3 % (ref 35.0–45.0)
Hemoglobin: 13.6 g/dL (ref 11.7–15.5)
MCH: 32.6 pg (ref 27.0–33.0)
MCHC: 33.7 g/dL (ref 32.0–36.0)
MCV: 96.6 fL (ref 80.0–100.0)
MPV: 9.3 fL (ref 7.5–12.5)
Monocytes Relative: 8.8 %
Neutro Abs: 4291 {cells}/uL (ref 1500–7800)
Neutrophils Relative %: 63.1 %
Platelets: 291 10*3/uL (ref 140–400)
RBC: 4.17 10*6/uL (ref 3.80–5.10)
RDW: 11.7 % (ref 11.0–15.0)
Total Lymphocyte: 25.1 %
WBC: 6.8 10*3/uL (ref 3.8–10.8)

## 2023-07-06 LAB — COMPREHENSIVE METABOLIC PANEL
AG Ratio: 1.7 (calc) (ref 1.0–2.5)
ALT: 8 U/L (ref 6–29)
AST: 15 U/L (ref 10–35)
Albumin: 4.3 g/dL (ref 3.6–5.1)
Alkaline phosphatase (APISO): 49 U/L (ref 37–153)
BUN: 16 mg/dL (ref 7–25)
CO2: 27 mmol/L (ref 20–32)
Calcium: 9.3 mg/dL (ref 8.6–10.4)
Chloride: 101 mmol/L (ref 98–110)
Creat: 0.8 mg/dL (ref 0.60–0.95)
Globulin: 2.6 g/dL (ref 1.9–3.7)
Glucose, Bld: 86 mg/dL (ref 65–99)
Potassium: 3.4 mmol/L — ABNORMAL LOW (ref 3.5–5.3)
Sodium: 138 mmol/L (ref 135–146)
Total Bilirubin: 0.5 mg/dL (ref 0.2–1.2)
Total Protein: 6.9 g/dL (ref 6.1–8.1)

## 2023-07-06 LAB — QUANTIFERON-TB GOLD PLUS
Mitogen-NIL: >10 [IU]/mL
NIL: 0.06 [IU]/mL
QuantiFERON-TB Gold Plus: NEGATIVE
TB1-NIL: <0 [IU]/mL
TB2-NIL: <0 [IU]/mL

## 2023-07-06 LAB — LIPID PANEL
Cholesterol: 133 mg/dL (ref <200)
HDL: 54 mg/dL (ref >=50)
LDL Cholesterol (Calc): 64 mg/dL
Non-HDL Cholesterol (Calc): 79 mg/dL (ref <130)
Total CHOL/HDL Ratio: 2.5 (calc) (ref <5.0)
Triglycerides: 71 mg/dL (ref <150)

## 2023-07-06 NOTE — Progress Notes (Signed)
 Potassium is low.  Please notify patient and forward results to her PCP.  Patient should contact her PCP regarding low potassium.  TB Gold is negative.  CBC is normal.  Her lipid panel is normal.

## 2023-07-31 ENCOUNTER — Encounter: Payer: Medicare Other | Attending: Rheumatology | Admitting: *Deleted

## 2023-07-31 VITALS — BP 140/70 | HR 68 | Temp 97.6°F | Resp 16

## 2023-07-31 DIAGNOSIS — M0579 Rheumatoid arthritis with rheumatoid factor of multiple sites without organ or systems involvement: Secondary | ICD-10-CM | POA: Diagnosis not present

## 2023-07-31 DIAGNOSIS — Z13220 Encounter for screening for lipoid disorders: Secondary | ICD-10-CM | POA: Diagnosis not present

## 2023-07-31 DIAGNOSIS — Z111 Encounter for screening for respiratory tuberculosis: Secondary | ICD-10-CM | POA: Diagnosis not present

## 2023-07-31 DIAGNOSIS — Z7969 Long term (current) use of other immunomodulators and immunosuppressants: Secondary | ICD-10-CM | POA: Insufficient documentation

## 2023-07-31 DIAGNOSIS — Z79899 Other long term (current) drug therapy: Secondary | ICD-10-CM | POA: Diagnosis not present

## 2023-07-31 MED ORDER — SODIUM CHLORIDE 0.9 % IV SOLN
1000.0000 mg | Freq: Once | INTRAVENOUS | Status: AC
  Administered 2023-07-31: 1000 mg via INTRAVENOUS
  Filled 2023-07-31: qty 40

## 2023-07-31 NOTE — Progress Notes (Signed)
 Diagnosis: Rheumatoid Arthritis  Provider:  Pollyann Savoy MD  Procedure: IV Infusion  IV Type: Peripheral, IV Location: L Antecubital  Orencia (Abatacept), Dose: 1000 mg  Infusion Start Time: 0930  Infusion Stop Time: 1000  Post Infusion IV Care: Observation period completed  Discharge: Condition: Good, Destination: Home . AVS Provided  Performed by:  Daleen Squibb, RN

## 2023-08-28 ENCOUNTER — Encounter: Payer: Medicare Other | Attending: Rheumatology | Admitting: *Deleted

## 2023-08-28 ENCOUNTER — Other Ambulatory Visit: Payer: Self-pay | Admitting: Emergency Medicine

## 2023-08-28 VITALS — BP 99/85 | HR 60 | Temp 98.0°F | Resp 16

## 2023-08-28 DIAGNOSIS — M0579 Rheumatoid arthritis with rheumatoid factor of multiple sites without organ or systems involvement: Secondary | ICD-10-CM

## 2023-08-28 DIAGNOSIS — Z13220 Encounter for screening for lipoid disorders: Secondary | ICD-10-CM | POA: Insufficient documentation

## 2023-08-28 DIAGNOSIS — Z111 Encounter for screening for respiratory tuberculosis: Secondary | ICD-10-CM | POA: Diagnosis not present

## 2023-08-28 DIAGNOSIS — Z79899 Other long term (current) drug therapy: Secondary | ICD-10-CM | POA: Insufficient documentation

## 2023-08-28 MED ORDER — ABATACEPT 250 MG IV SOLR
1000.0000 mg | Freq: Once | INTRAVENOUS | Status: AC
  Administered 2023-08-28: 1000 mg via INTRAVENOUS
  Filled 2023-08-28: qty 40

## 2023-08-28 NOTE — Progress Notes (Signed)
 Diagnosis: Rheumatoid Arthritis  Provider:  Nicholas Bari MD  Procedure: IV Infusion  IV Type: Peripheral, IV Location: L Antecubital  Orencia  (Abatacept ), Dose: 1000 mg  Infusion Start Time: 0933  Infusion Stop Time: 1005  Post Infusion IV Care: Observation period completed  Discharge: Condition: Good, Destination: Home . AVS Provided  Performed by:  Verneda Golder, RN

## 2023-08-29 LAB — COMPREHENSIVE METABOLIC PANEL WITH GFR
AG Ratio: 1.9 (calc) (ref 1.0–2.5)
ALT: 6 U/L (ref 6–29)
AST: 16 U/L (ref 10–35)
Albumin: 4.1 g/dL (ref 3.6–5.1)
Alkaline phosphatase (APISO): 49 U/L (ref 37–153)
BUN: 12 mg/dL (ref 7–25)
CO2: 30 mmol/L (ref 20–32)
Calcium: 9 mg/dL (ref 8.6–10.4)
Chloride: 103 mmol/L (ref 98–110)
Creat: 0.76 mg/dL (ref 0.60–0.95)
Globulin: 2.2 g/dL (ref 1.9–3.7)
Glucose, Bld: 81 mg/dL (ref 65–99)
Potassium: 4 mmol/L (ref 3.5–5.3)
Sodium: 139 mmol/L (ref 135–146)
Total Bilirubin: 0.5 mg/dL (ref 0.2–1.2)
Total Protein: 6.3 g/dL (ref 6.1–8.1)
eGFR: 78 mL/min/{1.73_m2} (ref >=60)

## 2023-08-29 LAB — CBC WITH DIFFERENTIAL/PLATELET
Absolute Lymphocytes: 1653 {cells}/uL (ref 850–3900)
Absolute Monocytes: 441 {cells}/uL (ref 200–950)
Basophils Absolute: 41 {cells}/uL (ref 0–200)
Basophils Relative: 0.7 %
Eosinophils Absolute: 168 {cells}/uL (ref 15–500)
Eosinophils Relative: 2.9 %
HCT: 38.5 % (ref 35.0–45.0)
Hemoglobin: 12.9 g/dL (ref 11.7–15.5)
MCH: 33.1 pg — ABNORMAL HIGH (ref 27.0–33.0)
MCHC: 33.5 g/dL (ref 32.0–36.0)
MCV: 98.7 fL (ref 80.0–100.0)
MPV: 9.3 fL (ref 7.5–12.5)
Monocytes Relative: 7.6 %
Neutro Abs: 3497 {cells}/uL (ref 1500–7800)
Neutrophils Relative %: 60.3 %
Platelets: 286 10*3/uL (ref 140–400)
RBC: 3.9 10*6/uL (ref 3.80–5.10)
RDW: 11.7 % (ref 11.0–15.0)
Total Lymphocyte: 28.5 %
WBC: 5.8 10*3/uL (ref 3.8–10.8)

## 2023-08-29 NOTE — Progress Notes (Signed)
 CBC and CMP are normal.

## 2023-09-06 NOTE — Progress Notes (Signed)
 HPI female never smoker followed for asthma, allergic rhinitis, complicated by rheumatoid arthritis PFT 02/02/09-mild obstruction small airways, minimal response bronchodilator, DLCO mildly reduced. FVC 3.36/116%, FEV1 2.48/119%, ratio 0.74, FEF 25-75% 1.80/75%, TLC 108%, DLCO 70% Office Spirometry 09/08/16-WNL-FVC 2.63/98%, FEV1 1.91/95%, ratio 0.73, FEF 25-75% 1.32/82% FENO 02/12/17- 32H -------------------------------------------------------------------------------------  05/10/23- 83 year old female never smoker followed for Asthmatic Bronchitis, chronic cough, Allergic Rhinitis, complicated by Rheumatoid Arthritis, Osteoarthritis, Depression, HTN,  -Trelegy> Stiolto, Ventolin  hfa       Orencia  and Plaquenil  for RA We gave Breztri  samples to use before meals at last  visit since Trelegy caused hoarseness and Stiolto not as effective. Body weight today- 214 lbs Rheumatology manages her RA. Discussed the use of AI scribe software for clinical note transcription with the patient, who gave verbal consent to proceed.  History of Present Illness   The patient, with a history of asthma and rheumatoid arthritis, presents with hoarseness and increased coughing and wheezing. The patient has been on Trelegy, an inhaler for asthma, but switched to Stiolto due to hoarseness. However, the patient reported that Stiolto did not control their asthma as well, leading to increased coughing and wheezing. The patient then tried Breztri , but was concerned about the steroid component in the inhaler, which they believed was causing the hoarseness. The patient also noted that they are on Orencia , an infusion medicine for rheumatoid arthritis, which may be affecting their immune system. The patient expressed concern about potential side effects of the inhalers, including osteoporosis, depression, and high blood pressure. The patient also reported having difficulty sleeping due to their symptoms and has been researching their  condition and treatments online. We had long discussion of inhaler components. We are going to try Symbicort / Spiriva  with her spacer tube.   Assessment and Plan:    Asthma Increased coughing and wheezing after switching from Trelegy to Stiolto. Hoarseness improved with Stiolto. Patient tried Breztri  but was concerned about the steroid component. Patient prefers Trelegy but is concerned about hoarseness. -Start Symbicort  (2 puffs twice a day) and Spiriva  (1 puff daily). -Use a spacer tube with Symbicort  to reduce hoarseness. -Rinse mouth after using inhalers. -Consider biologic therapy if current regimen is not effective.  RSV Vaccination Patient was asked about RSV vaccination by another provider. -Discussed the new RSV vaccine and its potential benefits. -Decision on vaccination deferred to future visit.  Follow-up in 4 months or sooner if needed.   09/07/23-  83 year old female never smoker followed for Asthmatic Bronchitis, chronic Cough, Allergic Rhinitis, complicated by Rheumatoid Arthritis, Osteoarthritis, Osteoporosis, Depression, HTN,  -Symbicort  160, Spiriva  1.25, Ventolin  hfa       Orencia  and Plaquenil  for RA -----Cough with some wheezing.  Albuterol  is helping with wheezing. ACT score- 15 Discussed the use of AI scribe software for clinical note transcription with the patient, who gave verbal consent to proceed.  History of Present Illness   Monica [name] is an 83 year old female with chronic obstructive pulmonary disease (COPD) who presents with increased coughing and wheezing.  She experiences increased coughing and wheezing, which she attributes to issues with her medication coverage. She uses Spiriva  as prescribed, but insurance coverage issues led to a gap in her medication use. She currently uses Symbicort  plus Spiriva  and an albuterol  rescue inhaler. The albuterol  inhaler is used once or twice most days. With Symbicort  and Spiriva , her symptoms are well-controlled,  reducing the need for the rescue inhaler.  She wakes up at night with chest pressure, which improves  after deep breaths and walking. The rescue inhaler provides relief. She has adjusted her medication intake to one puff twice a day or two puffs once a day, based on her understanding of the prescription instructions. She uses a spacer with her inhaler, improving medication delivery and reducing throat irritation.     Assessment and Plan:    Asthma moderate persistent Chronic cough Chronic cough with wheezing, likely due to suboptimal medication regimen. Insurance limits Spiriva  to two inhalations once daily. Possible contribution from spring pollen. Trace crackles may relate to rheumatoid arthritis. Symbicort  plus Spiriva  equivalent to Trelegy, but particle size and flow mechanics differ. - Rewrite Spiriva  prescription for two puffs per day, either as one puff twice a day or two puffs once a day. - Order chest x-ray to evaluate chronic cough, especially in context of rheumatoid arthritis. - Continue Symbicort  plus Spiriva , with albuterol  as needed.  Rheumatoid arthritis Rheumatoid arthritis with potential pulmonary involvement indicated by trace crackles. Chronic cough may relate to rheumatoid arthritis. - Monitor for pulmonary involvement related to rheumatoid arthritis.     ROS-see HPI    + = positive Constitutional:   No-   weight loss, night sweats, fevers, chills, fatigue, lassitude. HEENT:   No-  headaches, difficulty swallowing, tooth/dental problems, sore throat,       No-  sneezing, itching, ear ache, nasal congestion, +post nasal drip,  CV:  No-   chest pain, orthopnea, PND, +swelling in lower extremities, anasarca, dizziness, palpitations Resp:   + shortness of breath with exertion or at rest.              No-   productive cough,   +non-productive cough,  No- coughing up of blood.              No-   change in color of mucus.  + wheezing.   Skin: No-   rash or lesions. GI:  No-    heartburn, indigestion, abdominal pain, nausea, vomiting,  GU:  MS: + joint pain or swelling.   Neuro-     nothing unusual Psych:  No- change in mood or affect. No depression or anxiety.  No memory loss.  OBJ        General- Alert, Oriented, Affect-appropriate, Distress- none acute. + Morbidly obese Skin- rash-none, lesions- none, excoriation- none Lymphadenopathy- none Head- atraumatic            Eyes- Gross vision intact, PERRLA, conjunctivae clear secretions            Ears- Hearing, canals-normal            Nose- Clear, no-Septal dev, mucus, polyps, erosion, perforation             Throat- Mallampati III , mucosa clear , drainage- none, tonsils- atrophic.  Neck- flexible , trachea midline, no stridor , thyroid  nl, carotid no bruit Chest - symmetrical excursion , unlabored           Heart/CV- RRR , no murmur , no gallop  , no rub, nl s1 s2                           - JVD- none , edema- none, stasis changes- none, varices- none           Lung-  wheeze- none, +cough- mild loose , dullness-none, rub- none           Chest wall-  Abd-  Br/ Gen/ Rectal- Not done,  not indicated Extrem- cyanosis- none, clubbing, none, atrophy- none, strength- nl Neuro- grossly intact to observation

## 2023-09-07 ENCOUNTER — Encounter: Payer: Self-pay | Admitting: Internal Medicine

## 2023-09-07 ENCOUNTER — Ambulatory Visit (INDEPENDENT_AMBULATORY_CARE_PROVIDER_SITE_OTHER)

## 2023-09-07 ENCOUNTER — Ambulatory Visit: Payer: Medicare Other | Admitting: Internal Medicine

## 2023-09-07 VITALS — BP 110/75 | HR 86 | Temp 97.9°F | Ht 62.5 in | Wt 210.6 lb

## 2023-09-07 DIAGNOSIS — M069 Rheumatoid arthritis, unspecified: Secondary | ICD-10-CM

## 2023-09-07 DIAGNOSIS — M0579 Rheumatoid arthritis with rheumatoid factor of multiple sites without organ or systems involvement: Secondary | ICD-10-CM | POA: Diagnosis not present

## 2023-09-07 DIAGNOSIS — J4540 Moderate persistent asthma, uncomplicated: Secondary | ICD-10-CM

## 2023-09-07 DIAGNOSIS — I771 Stricture of artery: Secondary | ICD-10-CM | POA: Diagnosis not present

## 2023-09-07 MED ORDER — SPIRIVA RESPIMAT 1.25 MCG/ACT IN AERS: INHALATION_SPRAY | RESPIRATORY_TRACT | 12 refills | Status: AC

## 2023-09-07 NOTE — Patient Instructions (Signed)
 Continue Symbicort  2 puffs, then rinse mouth, twice daily with spacer  Continue albuterol  rescue inhaler - 2 puffs every 4-6 hours, as needed  Script sent for Spiriva - total of 2 puffs daily  Order- CXR    dx Asthma moderate persistent, Rheumatoid Arthritis

## 2023-09-17 ENCOUNTER — Encounter: Payer: Self-pay | Admitting: Internal Medicine

## 2023-09-24 ENCOUNTER — Other Ambulatory Visit: Payer: Self-pay | Admitting: Physician Assistant

## 2023-09-24 DIAGNOSIS — M0579 Rheumatoid arthritis with rheumatoid factor of multiple sites without organ or systems involvement: Secondary | ICD-10-CM

## 2023-09-24 NOTE — Telephone Encounter (Signed)
 Last Fill: 06/25/2023  Eye exam: 11/15/2022 WNL    Labs: 08/28/2023 CBC and CMP are normal.   Next Visit: 11/06/2023  Last Visit: 06/07/2023  [id number] arthritis with rheumatoid factor of multiple sites without organ or systems involvement   Current Dose per office note 06/07/2023: Plaquenil  200 mg in the morning and 100 mg at bedtime.   Okay to refill Plaquenil ?

## 2023-09-25 ENCOUNTER — Encounter: Payer: Medicare Other | Attending: Rheumatology | Admitting: Emergency Medicine

## 2023-09-25 VITALS — BP 136/77 | HR 64 | Temp 97.8°F | Resp 16

## 2023-09-25 DIAGNOSIS — Z79899 Other long term (current) drug therapy: Secondary | ICD-10-CM | POA: Diagnosis not present

## 2023-09-25 DIAGNOSIS — Z111 Encounter for screening for respiratory tuberculosis: Secondary | ICD-10-CM | POA: Insufficient documentation

## 2023-09-25 DIAGNOSIS — M0579 Rheumatoid arthritis with rheumatoid factor of multiple sites without organ or systems involvement: Secondary | ICD-10-CM | POA: Insufficient documentation

## 2023-09-25 DIAGNOSIS — Z13220 Encounter for screening for lipoid disorders: Secondary | ICD-10-CM | POA: Diagnosis not present

## 2023-09-25 MED ORDER — SODIUM CHLORIDE 0.9 % IV SOLN
1000.0000 mg | Freq: Once | INTRAVENOUS | Status: AC
  Administered 2023-09-25: 1000 mg via INTRAVENOUS
  Filled 2023-09-25: qty 40

## 2023-09-25 NOTE — Progress Notes (Signed)
 Diagnosis: Rheumatoid Arthritis  Provider:  Nicholas Bari MD  Procedure: IV Infusion  IV Type: Peripheral, IV Location: L Antecubital  Orencia  (Abatacept ), Dose: 1000 mg  Infusion Start Time: 0930  Infusion Stop Time: 1000  Post Infusion IV Care: Observation period completed and Peripheral IV Discontinued  Discharge: Condition: Good, Destination: Home . AVS Provided  Performed by:  Rico Charters, RN

## 2023-10-23 ENCOUNTER — Encounter: Attending: Rheumatology | Admitting: Emergency Medicine

## 2023-10-23 ENCOUNTER — Other Ambulatory Visit: Payer: Self-pay | Admitting: Emergency Medicine

## 2023-10-23 VITALS — BP 157/72 | HR 70 | Temp 98.0°F | Resp 16

## 2023-10-23 DIAGNOSIS — M0579 Rheumatoid arthritis with rheumatoid factor of multiple sites without organ or systems involvement: Secondary | ICD-10-CM | POA: Diagnosis not present

## 2023-10-23 DIAGNOSIS — Z13220 Encounter for screening for lipoid disorders: Secondary | ICD-10-CM | POA: Diagnosis not present

## 2023-10-23 DIAGNOSIS — Z111 Encounter for screening for respiratory tuberculosis: Secondary | ICD-10-CM | POA: Insufficient documentation

## 2023-10-23 DIAGNOSIS — Z79899 Other long term (current) drug therapy: Secondary | ICD-10-CM | POA: Insufficient documentation

## 2023-10-23 MED ORDER — SODIUM CHLORIDE 0.9 % IV SOLN
1000.0000 mg | Freq: Once | INTRAVENOUS | Status: AC
  Administered 2023-10-23: 1000 mg via INTRAVENOUS
  Filled 2023-10-23: qty 40

## 2023-10-23 NOTE — Progress Notes (Signed)
 Office Visit Note  Patient: Monica [name]             Date of Birth: Jul 22, 1940           MRN: [id number]             PCP: Marvene Prentice SAUNDERS, FNP Referring: Marvene Prentice SAUNDERS, FNP Visit Date: 11/06/2023 Occupation: @GUAROCC @  Subjective:  Medication management  History of Present Illness: Monica [name] is a 83 y.o. female with rheumatoid arthritis, osteoarthritis and osteoporosis.  She returns today after her last visit in January 2025.  She continues to have discomfort in her both hands, knee joints and her feet.  She has been experiencing increased pain and discomfort in her right knee joint and left hip which she describes over the trochanteric region.  She has been having nocturnal pain and has difficulty sleeping on her side.  She has not noticed any increased joint swelling.  She is on IV Orencia  1000 mg every 28 days, Plaquenil  200 mg in the morning and 100 mg at bedtime without any interruption.  She has been on Fosamax  70 mg p.o. weekly for osteoporosis since August 2024.    Activities of Daily Living:  Patient reports morning stiffness for 1-2 hours.   Patient Denies nocturnal pain.  Difficulty dressing/grooming: Denies Difficulty climbing stairs: Reports Difficulty getting out of chair: Reports Difficulty using hands for taps, buttons, cutlery, and/or writing: Reports  Review of Systems  Constitutional:  Positive for fatigue.  HENT:  Negative for mouth sores and mouth dryness.   Eyes:  Positive for dryness.  Respiratory:  Negative for difficulty breathing.   Cardiovascular:  Negative for chest pain and palpitations.  Gastrointestinal:  Negative for blood in stool, constipation and diarrhea.  Endocrine: Positive for increased urination.  Genitourinary:  Negative for involuntary urination.  Musculoskeletal:  Positive for joint pain, gait problem, joint pain, joint swelling, myalgias, muscle weakness, morning stiffness and myalgias. Negative for muscle tenderness.  Skin:   Negative for color change, rash, hair loss and sensitivity to sunlight.  Allergic/Immunologic: Negative for susceptible to infections.  Neurological:  Positive for light-headedness. Negative for dizziness and headaches.  Hematological:  Negative for swollen glands.  Psychiatric/Behavioral:  Positive for depressed mood and sleep disturbance. The patient is not nervous/anxious.     PMFS History:  Patient Active Problem List   Diagnosis Date Noted   Lipid screening 06/28/2023   Screening for tuberculosis 06/28/2023   Asthmatic bronchitis 02/16/2019   Osteopenia 12/20/2016   History of depression 12/11/2016   High risk medication use 05/15/2016   Primary osteoarthritis of both hands 05/15/2016   Primary osteoarthritis of both feet 05/15/2016   Primary osteoarthritis of both knees 05/15/2016   Vitamin D  deficiency 05/15/2016   History of hypertension 05/15/2016   History of hyperlipidemia 05/15/2016   Rheumatoid arthritis with rheumatoid factor of multiple sites without organ or systems involvement (HCC) 01/10/2016   Seasonal allergic rhinitis 10/03/2011   HYPERLIPIDEMIA 12/31/2008   Essential hypertension 12/31/2008   Mild persistent asthmatic bronchitis with exacerbation 12/31/2008    Past Medical History:  Diagnosis Date   Arthritis    Asthma    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, FEV1 119%   Hyperlipidemia    Hypertension     Family History  Problem Relation Age of Onset   Lung cancer Father    Alzheimer's disease Mother    Past Surgical History:  Procedure Laterality Date   CARPAL TUNNEL RELEASE  FOOT SURGERY     MOUTH SURGERY Right 05/13/2020   Social History   Social History Narrative   Not on file   Immunization History  Administered Date(s) Administered   Fluad Quad(high Dose 65+) 02/13/2019, 02/18/2020, 02/17/2021   Fluad Trivalent(High Dose 65+) 02/19/2023   Influenza Nasal 02/29/2012   Influenza Split 03/02/2011   Influenza, High Dose Seasonal PF  02/12/2017, 02/12/2018   Influenza,inj,Quad PF,6+ Mos 02/25/2013, 02/23/2014   Influenza-Unspecified 03/15/2015   Moderna Sars-Covid-2 Vaccination 07/16/2019, 08/13/2019, 08/06/2020   Pneumococcal Conjugate-13 02/25/2013   Zoster Recombinant(Shingrix) 09/27/2021     Objective: Vital Signs: BP 125/78 (BP Location: Left Arm, Patient Position: Sitting, Cuff Size: Normal)   Pulse 77   Resp 16   Ht 5' 1.5 (1.562 m)   Wt 206 lb 6.4 oz (93.6 kg)   BMI 38.37 kg/m    Physical Exam Vitals and nursing note reviewed.  Constitutional:      Appearance: She is well-developed.  HENT:     Head: Normocephalic and atraumatic.   Eyes:     Conjunctiva/sclera: Conjunctivae normal.    Cardiovascular:     Rate and Rhythm: Normal rate and regular rhythm.     Heart sounds: Normal heart sounds.  Pulmonary:     Effort: Pulmonary effort is normal.     Breath sounds: Normal breath sounds.  Abdominal:     General: Bowel sounds are normal.     Palpations: Abdomen is soft.   Musculoskeletal:     Cervical back: Normal range of motion.  Lymphadenopathy:     Cervical: No cervical adenopathy.   Skin:    General: Skin is warm and dry.     Capillary Refill: Capillary refill takes less than 2 seconds.   Neurological:     Mental Status: She is alert and oriented to person, place, and time.   Psychiatric:        Behavior: Behavior normal.      Musculoskeletal Exam: She had good range of motion of the cervical spine.  Thoracic kyphosis was noted.  She had no tenderness over thoracic or lumbar spine.  Shoulders, elbows, wrist, MCPs PIPs and DIPs with good range of motion.  Bilateral CMC thickening was noted.  Hip joints were in good range of motion.  She had tenderness over left trochanteric bursa.  She had bilateral genu valgus deformity.  There was no tenderness over her ankles or MTPs.  CDAI Exam: CDAI Score: -- Patient Global: --; Provider Global: -- Swollen: --; Tender: -- Joint Exam  11/06/2023   No joint exam has been documented for this visit   There is currently no information documented on the homunculus. Go to the Rheumatology activity and complete the homunculus joint exam.  Investigation: No additional findings.  Imaging: No results found.  Recent Labs: Lab Results  Component Value Date   WBC 5.8 08/28/2023   HGB 12.9 08/28/2023   PLT 286 08/28/2023   NA 139 08/28/2023   K 4.0 08/28/2023   CL 103 08/28/2023   CO2 30 08/28/2023   GLUCOSE 81 08/28/2023   BUN 12 08/28/2023   CREATININE 0.76 08/28/2023   BILITOT 0.5 08/28/2023   ALKPHOS 56 11/14/2022   AST 16 08/28/2023   ALT 6 08/28/2023   PROT 6.3 08/28/2023   ALBUMIN 3.9 11/14/2022   CALCIUM 9.0 08/28/2023   GFRAA >60 01/27/2020   QFTBGOLD Negative 07/24/2016   QFTBGOLDPLUS NEGATIVE 07/03/2023    Speciality Comments: PLQ eye exam: 11/15/2022 WNL  @ [facility]. Follow up in 1 year. Orencia  1000 mg IV q 28days started on 09/2016  Prior therapy includes: Simponi  Aria (inadequate response) Fosamax   January 03, 2023  J  Procedures:  No procedures performed Allergies: Arava [leflunomide] and Piroxicam   Assessment / Plan:     Visit Diagnoses: Rheumatoid arthritis with rheumatoid factor of multiple sites without organ or systems involvement (HCC)-patient denies having a rheumatoid arthritis flare.  Although she continues to have pain and discomfort in her joints due to underlying osteoarthritis.  She complains of increased discomfort in her left trochanteric region and also her right knee.  She continues to be on IV Orencia  and Plaquenil .  She had no interruption in the treatment.  High risk medication use - Orencia  IV 1000 mg every 28 days (started May 2018), Plaquenil  200 mg in the morning and 100 mg at bedtime. PLQ eye exam: 11/15/2022.  Patient was advised to have repeat eye examination on an annual basis.  TB Gold was negative on July 03, 2023.  She had labs done at her PCPs  office on October 26, 2023.  CBC WBC 6.0, hemoglobin 13.2, platelets 294, CMP GFR 74, AST 15, ALT 8, hemoglobin A1c 5.3%, LDL 66, triglycerides 66.  Information reimmunization was placed in the AVS.  She will continue to get labs with infusions.  Annual skin examination to screen for skin cancer was advised.  Use of sunscreen and sun protection was discussed.  Primary osteoarthritis of both hands-she has involvement of bilateral CMC joints which causes discomfort.  Left trochanteric bursitis-she has been having pain and discomfort over the left trochanteric bursa.  IT band stretches were advised.  Patient declined cortisone injection.  Primary osteoarthritis of both knees-she has severe arthritis in her knee joints.  She continues to have pain and discomfort in her knee joints.  She ambulates with the help of a cane.  Primary osteoarthritis of both feet-no synovitis was noted on the examination today.  Age-related osteoporosis without current pathological fracture - November 30, 2022 T-score -2.5, BMD 0.574 right femoral neck no comparison, right total femur -14%, left total femur -9%, Fosamax  70 mg p.o. weekly since 08/24.  Vitamin D  deficiency-vitamin D  was normal on January 09, 2023.  History of hypertension-blood pressure was normal today.  History of hyperlipidemia-October 26, 2023 lipid panel was [facility] at [facility].  History of asthma-she is followed by Dr. Neysa.  She had a recent chest x-ray which was unremarkable.  Chest x-ray was reviewed per patient's request.  History of depression  Orders: No orders of the defined types were placed in this encounter.  No orders of the defined types were placed in this encounter.   Face-to-face time spent with patient was over 30 minutes. Greater than 50% of time was spent in counseling and coordination of care.  Follow-Up Instructions: Return in about 5 months (around 04/07/2024) for Rheumatoid arthritis, Osteoarthritis.   Maya Nash, MD  Note - This record has been created using Animal nutritionist.  Chart creation errors have been sought, but may not always  have been located. Such creation errors do not reflect on  the standard of medical care.

## 2023-10-23 NOTE — Progress Notes (Signed)
 Diagnosis: Rheumatoid Arthritis  Provider:  Nicholas Bari MD  Procedure: IV Infusion  IV Type: Peripheral, IV Location: L Antecubital  Orencia  (Abatacept ), Dose: 1000 mg  Infusion Start Time: 0940  Infusion Stop Time: 1012  Post Infusion IV Care: Peripheral IV Discontinued  Discharge: Condition: Good, Destination: Home . AVS Provided  Performed by:  Arlina Benjamin, RN

## 2023-10-25 DIAGNOSIS — I10 Essential (primary) hypertension: Secondary | ICD-10-CM | POA: Diagnosis not present

## 2023-10-25 DIAGNOSIS — R7303 Prediabetes: Secondary | ICD-10-CM | POA: Diagnosis not present

## 2023-10-25 DIAGNOSIS — J4530 Mild persistent asthma, uncomplicated: Secondary | ICD-10-CM | POA: Diagnosis not present

## 2023-10-25 DIAGNOSIS — F3341 Major depressive disorder, recurrent, in partial remission: Secondary | ICD-10-CM | POA: Diagnosis not present

## 2023-10-25 DIAGNOSIS — Z0000 Encounter for general adult medical examination without abnormal findings: Secondary | ICD-10-CM | POA: Diagnosis not present

## 2023-10-25 DIAGNOSIS — M810 Age-related osteoporosis without current pathological fracture: Secondary | ICD-10-CM | POA: Diagnosis not present

## 2023-10-25 DIAGNOSIS — I700 Atherosclerosis of aorta: Secondary | ICD-10-CM | POA: Diagnosis not present

## 2023-10-25 DIAGNOSIS — M069 Rheumatoid arthritis, unspecified: Secondary | ICD-10-CM | POA: Diagnosis not present

## 2023-10-25 DIAGNOSIS — Z6837 Body mass index (BMI) 37.0-37.9, adult: Secondary | ICD-10-CM | POA: Diagnosis not present

## 2023-10-25 DIAGNOSIS — M159 Polyosteoarthritis, unspecified: Secondary | ICD-10-CM | POA: Diagnosis not present

## 2023-10-25 DIAGNOSIS — F419 Anxiety disorder, unspecified: Secondary | ICD-10-CM | POA: Diagnosis not present

## 2023-10-25 DIAGNOSIS — E7800 Pure hypercholesterolemia, unspecified: Secondary | ICD-10-CM | POA: Diagnosis not present

## 2023-10-31 ENCOUNTER — Other Ambulatory Visit: Payer: Self-pay

## 2023-10-31 MED ORDER — DICLOFENAC SODIUM 1 % EX GEL: CUTANEOUS | 2 refills | Status: AC

## 2023-10-31 NOTE — Telephone Encounter (Signed)
 Patient contacted the office for lab results and a refill of the diclofenac . Patient requested to have refills on her prescription. Advised the patient we had not received the lab results yet. Patient states she new her first blood sample hemolyzed so they drew more. Contacted the infusion center at [phone]. Karen at the infusion center stated the patient's second sample also hemolyzed. Contacted the patient and advised her of the news. Patient was not happy but she states she will plan to update labs at her upcomming appointment.   Last Fill: 02/06/2023  Next Visit: 11/06/2023  Last Visit: 06/07/2023  Dx: not mentioned  Current Dose per office note on 06/07/2023: not mentioned   Okay to refill Voltaren  Gel?

## 2023-11-06 ENCOUNTER — Encounter: Payer: Self-pay | Admitting: Rheumatology

## 2023-11-06 ENCOUNTER — Encounter: Payer: Medicare Other | Attending: Rheumatology | Admitting: Rheumatology

## 2023-11-06 VITALS — BP 125/78 | HR 77 | Resp 16 | Ht 61.5 in | Wt 206.4 lb

## 2023-11-06 DIAGNOSIS — Z8639 Personal history of other endocrine, nutritional and metabolic disease: Secondary | ICD-10-CM | POA: Diagnosis not present

## 2023-11-06 DIAGNOSIS — Z13220 Encounter for screening for lipoid disorders: Secondary | ICD-10-CM | POA: Diagnosis not present

## 2023-11-06 DIAGNOSIS — M19042 Primary osteoarthritis, left hand: Secondary | ICD-10-CM | POA: Diagnosis not present

## 2023-11-06 DIAGNOSIS — M19071 Primary osteoarthritis, right ankle and foot: Secondary | ICD-10-CM | POA: Insufficient documentation

## 2023-11-06 DIAGNOSIS — M170 Bilateral primary osteoarthritis of knee: Secondary | ICD-10-CM | POA: Insufficient documentation

## 2023-11-06 DIAGNOSIS — M19072 Primary osteoarthritis, left ankle and foot: Secondary | ICD-10-CM | POA: Insufficient documentation

## 2023-11-06 DIAGNOSIS — Z79899 Other long term (current) drug therapy: Secondary | ICD-10-CM | POA: Insufficient documentation

## 2023-11-06 DIAGNOSIS — M7062 Trochanteric bursitis, left hip: Secondary | ICD-10-CM | POA: Insufficient documentation

## 2023-11-06 DIAGNOSIS — E559 Vitamin D deficiency, unspecified: Secondary | ICD-10-CM | POA: Insufficient documentation

## 2023-11-06 DIAGNOSIS — M8589 Other specified disorders of bone density and structure, multiple sites: Secondary | ICD-10-CM

## 2023-11-06 DIAGNOSIS — M0579 Rheumatoid arthritis with rheumatoid factor of multiple sites without organ or systems involvement: Secondary | ICD-10-CM | POA: Diagnosis present

## 2023-11-06 DIAGNOSIS — Z8679 Personal history of other diseases of the circulatory system: Secondary | ICD-10-CM | POA: Insufficient documentation

## 2023-11-06 DIAGNOSIS — M19041 Primary osteoarthritis, right hand: Secondary | ICD-10-CM | POA: Insufficient documentation

## 2023-11-06 DIAGNOSIS — Z8659 Personal history of other mental and behavioral disorders: Secondary | ICD-10-CM | POA: Diagnosis not present

## 2023-11-06 DIAGNOSIS — M65331 Trigger finger, right middle finger: Secondary | ICD-10-CM

## 2023-11-06 DIAGNOSIS — Z111 Encounter for screening for respiratory tuberculosis: Secondary | ICD-10-CM | POA: Diagnosis not present

## 2023-11-06 DIAGNOSIS — M810 Age-related osteoporosis without current pathological fracture: Secondary | ICD-10-CM | POA: Insufficient documentation

## 2023-11-06 DIAGNOSIS — Z8709 Personal history of other diseases of the respiratory system: Secondary | ICD-10-CM | POA: Insufficient documentation

## 2023-11-06 NOTE — Patient Instructions (Signed)
 Vaccines You are taking a medication(s) that can suppress your immune system.  The following immunizations are recommended: Flu annually Covid-19  Td/Tdap (tetanus, diphtheria, pertussis) every 10 years Pneumonia (Prevnar 15 then Pneumovax 23 at least 1 year apart.  Alternatively, can take Prevnar 20 without needing additional dose) Shingrix: 2 doses from 4 weeks to 6 months apart  Please check with your PCP to make sure you are up to date.   If you have signs or symptoms of an infection or start antibiotics: First, call your PCP for workup of your infection. Hold your medication through the infection, until you complete your antibiotics, and until symptoms resolve if you take the following: Injectable medication (Actemra, Benlysta, Cimzia, Cosentyx, Enbrel, Humira, Kevzara, Orencia , Remicade, Simponi , Stelara, Taltz, Tremfya) Methotrexate Leflunomide (Arava) Mycophenolate (Cellcept) Earma, Olumiant, or Rinvoq  Please get annual skin examination to screen for skin cancer while you are on Orencia .  Please use sunscreen and sun protection.

## 2023-11-16 DIAGNOSIS — H04123 Dry eye syndrome of bilateral lacrimal glands: Secondary | ICD-10-CM | POA: Diagnosis not present

## 2023-11-16 DIAGNOSIS — H25012 Cortical age-related cataract, left eye: Secondary | ICD-10-CM | POA: Diagnosis not present

## 2023-11-16 DIAGNOSIS — H524 Presbyopia: Secondary | ICD-10-CM | POA: Diagnosis not present

## 2023-11-16 DIAGNOSIS — H2512 Age-related nuclear cataract, left eye: Secondary | ICD-10-CM | POA: Diagnosis not present

## 2023-11-16 DIAGNOSIS — Z79899 Other long term (current) drug therapy: Secondary | ICD-10-CM | POA: Diagnosis not present

## 2023-11-16 DIAGNOSIS — H25042 Posterior subcapsular polar age-related cataract, left eye: Secondary | ICD-10-CM | POA: Diagnosis not present

## 2023-11-20 ENCOUNTER — Encounter: Admitting: Emergency Medicine

## 2023-11-20 ENCOUNTER — Other Ambulatory Visit: Payer: Self-pay

## 2023-11-20 VITALS — BP 125/73 | HR 61 | Temp 97.8°F | Resp 17

## 2023-11-20 DIAGNOSIS — M0579 Rheumatoid arthritis with rheumatoid factor of multiple sites without organ or systems involvement: Secondary | ICD-10-CM

## 2023-11-20 DIAGNOSIS — Z79899 Other long term (current) drug therapy: Secondary | ICD-10-CM | POA: Diagnosis not present

## 2023-11-20 DIAGNOSIS — M7062 Trochanteric bursitis, left hip: Secondary | ICD-10-CM | POA: Diagnosis not present

## 2023-11-20 DIAGNOSIS — Z111 Encounter for screening for respiratory tuberculosis: Secondary | ICD-10-CM

## 2023-11-20 DIAGNOSIS — M19041 Primary osteoarthritis, right hand: Secondary | ICD-10-CM | POA: Diagnosis not present

## 2023-11-20 DIAGNOSIS — M19042 Primary osteoarthritis, left hand: Secondary | ICD-10-CM | POA: Diagnosis not present

## 2023-11-20 DIAGNOSIS — M170 Bilateral primary osteoarthritis of knee: Secondary | ICD-10-CM | POA: Diagnosis not present

## 2023-11-20 DIAGNOSIS — Z13220 Encounter for screening for lipoid disorders: Secondary | ICD-10-CM

## 2023-11-20 MED ORDER — SODIUM CHLORIDE 0.9 % IV SOLN
1000.0000 mg | Freq: Once | INTRAVENOUS | Status: AC
Start: 2023-11-20 — End: 2023-11-20
  Administered 2023-11-20: 1000 mg via INTRAVENOUS
  Filled 2023-11-20: qty 40

## 2023-11-20 NOTE — Progress Notes (Signed)
 Diagnosis: Rheumatoid Arthritis  Provider:  Dolphus Reiter MD  Procedure: IV Infusion  IV Type: Peripheral, IV Location: L Antecubital  Orencia  (Abatacept ), Dose: 1000 mg  Infusion Start Time:  0931  Infusion Stop Time:  1006  Post Infusion IV Care: Observation period completed  Discharge: Condition: Good, Destination: Home . AVS Provided  Performed by:  Vernell JINNY Fitting, RN

## 2023-12-18 ENCOUNTER — Other Ambulatory Visit: Payer: Self-pay | Admitting: Emergency Medicine

## 2023-12-18 ENCOUNTER — Encounter: Attending: Rheumatology | Admitting: *Deleted

## 2023-12-18 VITALS — BP 151/92 | HR 67 | Temp 97.8°F | Resp 16

## 2023-12-18 DIAGNOSIS — Z79899 Other long term (current) drug therapy: Secondary | ICD-10-CM | POA: Insufficient documentation

## 2023-12-18 DIAGNOSIS — Z13220 Encounter for screening for lipoid disorders: Secondary | ICD-10-CM | POA: Insufficient documentation

## 2023-12-18 DIAGNOSIS — Z111 Encounter for screening for respiratory tuberculosis: Secondary | ICD-10-CM | POA: Insufficient documentation

## 2023-12-18 DIAGNOSIS — M0579 Rheumatoid arthritis with rheumatoid factor of multiple sites without organ or systems involvement: Secondary | ICD-10-CM | POA: Diagnosis not present

## 2023-12-18 LAB — CBC WITH DIFFERENTIAL/PLATELET
Absolute Lymphocytes: 1551 {cells}/uL (ref 850–3900)
Absolute Monocytes: 495 {cells}/uL (ref 200–950)
Basophils Absolute: 28 {cells}/uL (ref 0–200)
Basophils Relative: 0.5 %
Eosinophils Absolute: 160 {cells}/uL (ref 15–500)
Eosinophils Relative: 2.9 %
HCT: 38.9 % (ref 35.0–45.0)
Hemoglobin: 12.8 g/dL (ref 11.7–15.5)
MCH: 33 pg (ref 27.0–33.0)
MCHC: 32.9 g/dL (ref 32.0–36.0)
MCV: 100.3 fL — ABNORMAL HIGH (ref 80.0–100.0)
MPV: 9.2 fL (ref 7.5–12.5)
Monocytes Relative: 9 %
Neutro Abs: 3267 {cells}/uL (ref 1500–7800)
Neutrophils Relative %: 59.4 %
Platelets: 305 Thousand/uL (ref 140–400)
RBC: 3.88 Million/uL (ref 3.80–5.10)
RDW: 11.8 % (ref 11.0–15.0)
Total Lymphocyte: 28.2 %
WBC: 5.5 Thousand/uL (ref 3.8–10.8)

## 2023-12-18 LAB — COMPREHENSIVE METABOLIC PANEL WITH GFR
AG Ratio: 1.9 (calc) (ref 1.0–2.5)
ALT: 9 U/L (ref 6–29)
AST: 17 U/L (ref 10–35)
Albumin: 4.3 g/dL (ref 3.6–5.1)
Alkaline phosphatase (APISO): 47 U/L (ref 37–153)
BUN: 16 mg/dL (ref 7–25)
CO2: 26 mmol/L (ref 20–32)
Calcium: 9 mg/dL (ref 8.6–10.4)
Chloride: 103 mmol/L (ref 98–110)
Creat: 0.83 mg/dL (ref 0.60–0.95)
Globulin: 2.3 g/dL (ref 1.9–3.7)
Glucose, Bld: 97 mg/dL (ref 65–99)
Potassium: 3.6 mmol/L (ref 3.5–5.3)
Sodium: 138 mmol/L (ref 135–146)
Total Bilirubin: 0.6 mg/dL (ref 0.2–1.2)
Total Protein: 6.6 g/dL (ref 6.1–8.1)
eGFR: 70 mL/min/1.73m2 (ref >=60)

## 2023-12-18 MED ORDER — SODIUM CHLORIDE 0.9 % IV SOLN
1000.0000 mg | Freq: Once | INTRAVENOUS | Status: AC
Start: 2023-12-18 — End: 2023-12-18
  Administered 2023-12-18 (×2): 1000 mg via INTRAVENOUS
  Filled 2023-12-18: qty 40

## 2023-12-18 NOTE — Progress Notes (Signed)
 Diagnosis: Rheumatoid Arthritis  Provider:  Dolphus Reiter MD  Procedure: IV Infusion  IV Type: Peripheral, IV Location: L Antecubital  Orencia  (Abatacept ), Dose: 1000 mg  Infusion Start Time: 0937  Infusion Stop Time: 1010  Post Infusion IV Care: Observation period completed  Discharge: Condition: Good, Destination: Home . AVS Provided  Performed by:  Baldwin Darice Helling, RN

## 2023-12-19 ENCOUNTER — Ambulatory Visit: Payer: Self-pay | Admitting: Rheumatology

## 2023-12-19 NOTE — Progress Notes (Signed)
CBC and CMP were normal.

## 2023-12-20 ENCOUNTER — Other Ambulatory Visit: Payer: Self-pay | Admitting: Internal Medicine

## 2024-01-02 ENCOUNTER — Other Ambulatory Visit: Payer: Self-pay | Admitting: Rheumatology

## 2024-01-02 DIAGNOSIS — M0579 Rheumatoid arthritis with rheumatoid factor of multiple sites without organ or systems involvement: Secondary | ICD-10-CM

## 2024-01-02 MED ORDER — HYDROXYCHLOROQUINE SULFATE 200 MG PO TABS: ORAL_TABLET | ORAL | 0 refills | Status: DC

## 2024-01-02 NOTE — Telephone Encounter (Signed)
 Last Fill: 09/24/2023  Eye exam: 11/16/2023 WNL     Labs: 12/18/2023 CBC and CMP were normal.   Next Visit: 04/21/2024  Last Visit: 11/06/2023  IK:Myzlfjunpi arthritis with rheumatoid factor of multiple sites without organ or systems involvement   Current Dose per office note 11/06/2023: Plaquenil  200 mg in the morning and 100 mg at bedtime.   Okay to refill Plaquenil ?

## 2024-01-02 NOTE — Telephone Encounter (Signed)
 Patient contacted the office to request a medication refill.   1. Name of Medication: Plaquenil   2. How are you currently taking this medication (dosage and times per day)? Pt lvm on phones   3. What pharmacy would you like for that to be sent to? [facility]

## 2024-01-07 NOTE — Progress Notes (Unsigned)
 HPI female never smoker followed for asthma, allergic rhinitis, complicated by rheumatoid arthritis PFT 02/02/09-mild obstruction small airways, minimal response bronchodilator, DLCO mildly reduced. FVC 3.36/116%, FEV1 2.48/119%, ratio 0.74, FEF 25-75% 1.80/75%, TLC 108%, DLCO 70% Office Spirometry 09/08/16-WNL-FVC 2.63/98%, FEV1 1.91/95%, ratio 0.73, FEF 25-75% 1.32/82% FENO 02/12/17- 32H ===================================================================================   09/07/23-  83 year old female never smoker followed for Asthmatic Bronchitis, chronic Cough, Allergic Rhinitis, complicated by Rheumatoid Arthritis, Osteoarthritis, Osteoporosis, Depression, HTN,  -Symbicort  160, Spiriva  1.25, Ventolin  hfa       Orencia  and Plaquenil  for RA -----Cough with some wheezing.  Albuterol  is helping with wheezing. ACT score- 15 Discussed the use of AI scribe software for clinical note transcription with the patient, who gave verbal consent to proceed.  History of Present Illness   Monica [name] is an 83 year old female with asthma moderate persistent who presents with increased coughing and wheezing.  She experiences increased coughing and wheezing, which she attributes to issues with her medication coverage. She uses Spiriva  as prescribed, but insurance coverage issues led to a gap in her medication use. She currently uses Symbicort  plus Spiriva  and an albuterol  rescue inhaler. The albuterol  inhaler is used once or twice most days. With Symbicort  and Spiriva , her symptoms are well-controlled, reducing the need for the rescue inhaler.  She wakes up at night with chest pressure, which improves after deep breaths and walking. The rescue inhaler provides relief. She has adjusted her medication intake to one puff twice a day or two puffs once a day, based on her understanding of the prescription instructions. She uses a spacer with her inhaler, improving medication delivery and reducing throat irritation.      Assessment and Plan:    Asthma moderate persistent Chronic cough Chronic cough with wheezing, likely due to suboptimal medication regimen. Insurance limits Spiriva  to two inhalations once daily. Possible contribution from spring pollen. Trace crackles may relate to rheumatoid arthritis. Symbicort  plus Spiriva  equivalent to Trelegy, but particle size and flow mechanics differ. - Rewrite Spiriva  prescription for two puffs per day, either as one puff twice a day or two puffs once a day. - Order chest x-ray to evaluate chronic cough, especially in context of rheumatoid arthritis. - Continue Symbicort  plus Spiriva , with albuterol  as needed.  Rheumatoid arthritis Rheumatoid arthritis with potential pulmonary involvement indicated by trace crackles. Chronic cough may relate to rheumatoid arthritis. - Monitor for pulmonary involvement related to rheumatoid arthritis.     01/08/24-   83 year old female never smoker followed for Asthmatic Bronchitis, chronic Cough, Allergic Rhinitis, complicated by Rheumatoid Arthritis,  Obesity, Osteoarthritis, Osteoporosis, Depression, HTN,  -Symbicort  160, Spiriva  1.25, Ventolin  hfa       Orencia  and Plaquenil  for RA ACT score- 23 CXR 09/07/23 IMPRESSION: No active cardiopulmonary disease. Discussed the use of AI scribe software for clinical note transcription with the patient, who gave verbal consent to proceed.  History of Present Illness   Monica [name] is an 83 year old female who presents for follow-up regarding her cough and medication management.  Her cough has improved since the last visit. It previously worsened during the summer due to high humidity, causing frequent coughing during phone calls.  She is currently using Symbicort , Spiriva , and an albuterol  inhaler, all refilled last week. No additional refills have been needed.  She has received her flu shot at the clinic in the past and prefers to get it there.     Assessment and Plan:     Asthmatic Bronchitis mild  persistent uncomplicated Chronic obstructive pulmonary disease (COPD) Cough exacerbations resolved but triggered by high humidity and phone calls. - Continue Symbicort , Spiriva , and albuterol  inhaler. - Advised to inquire about and receive flu shot when available. - Schedule follow-up in six months.   Obesity -continue effort      ROS-see HPI    + = positive Constitutional:   No-   weight loss, night sweats, fevers, chills, fatigue, lassitude. HEENT:   No-  headaches, difficulty swallowing, tooth/dental problems, sore throat,       No-  sneezing, itching, ear ache, nasal congestion, +post nasal drip,  CV:  No-   chest pain, orthopnea, PND, +swelling in lower extremities, anasarca, dizziness, palpitations Resp:   + shortness of breath with exertion or at rest.              No-   productive cough,   +non-productive cough,  No- coughing up of blood.              No-   change in color of mucus.  + wheezing.   Skin: No-   rash or lesions. GI:  No-   heartburn, indigestion, abdominal pain, nausea, vomiting,  GU:  MS: + joint pain or swelling.   Neuro-     nothing unusual Psych:  No- change in mood or affect. No depression or anxiety.  No memory loss.  OBJ        General- Alert, Oriented, Affect-appropriate, Distress- none acute. + Morbidly obese Skin- rash-none, lesions- none, excoriation- none Lymphadenopathy- none Head- atraumatic            Eyes- Gross vision intact, PERRLA, conjunctivae clear secretions            Ears- Hearing, canals-normal            Nose- Clear, no-Septal dev, mucus, polyps, erosion, perforation             Throat- Mallampati III , mucosa clear , drainage- none, tonsils- atrophic.  Neck- flexible , trachea midline, no stridor , thyroid  nl, carotid no bruit Chest - symmetrical excursion , unlabored           Heart/CV- RRR , no murmur , no gallop  , no rub, nl s1 s2                           - JVD- none , edema- none, stasis changes-  none, varices- none           Lung-  wheeze- none, +cough- mild loose , dullness-none, rub- none           Chest wall-  Abd-  Br/ Gen/ Rectal- Not done, not indicated Extrem- cyanosis- none, clubbing, none, atrophy- none, strength- nl Neuro- grossly intact to observation

## 2024-01-08 ENCOUNTER — Encounter: Payer: Self-pay | Admitting: Internal Medicine

## 2024-01-08 ENCOUNTER — Ambulatory Visit (INDEPENDENT_AMBULATORY_CARE_PROVIDER_SITE_OTHER): Admitting: Internal Medicine

## 2024-01-08 VITALS — BP 138/80 | HR 76 | Ht 61.5 in | Wt 209.0 lb

## 2024-01-08 DIAGNOSIS — J4530 Mild persistent asthma, uncomplicated: Secondary | ICD-10-CM | POA: Diagnosis not present

## 2024-01-08 NOTE — Patient Instructions (Addendum)
 Ok to return for flu vax when available- senior strength- or get it at a pharmacy.   Please call if we can help

## 2024-01-15 ENCOUNTER — Encounter: Attending: Rheumatology | Admitting: *Deleted

## 2024-01-15 VITALS — BP 142/83 | HR 67 | Temp 98.0°F | Resp 16

## 2024-01-15 DIAGNOSIS — Z111 Encounter for screening for respiratory tuberculosis: Secondary | ICD-10-CM | POA: Diagnosis present

## 2024-01-15 DIAGNOSIS — M0579 Rheumatoid arthritis with rheumatoid factor of multiple sites without organ or systems involvement: Secondary | ICD-10-CM | POA: Insufficient documentation

## 2024-01-15 DIAGNOSIS — Z13220 Encounter for screening for lipoid disorders: Secondary | ICD-10-CM | POA: Insufficient documentation

## 2024-01-15 DIAGNOSIS — Z79899 Other long term (current) drug therapy: Secondary | ICD-10-CM | POA: Diagnosis present

## 2024-01-15 MED ORDER — SODIUM CHLORIDE 0.9 % IV SOLN
1000.0000 mg | Freq: Once | INTRAVENOUS | Status: AC
  Administered 2024-01-15: 1000 mg via INTRAVENOUS
  Filled 2024-01-15: qty 40

## 2024-01-15 NOTE — Progress Notes (Signed)
 Diagnosis: Rheumatoid Arthritis  Provider:  Dolphus Reiter MD  Procedure: IV Infusion  IV Type: Peripheral, IV Location: L Antecubital  Orencia  (Abatacept ), Dose: 1000 mg  Infusion Start Time: 0921  Infusion Stop Time: 0952  Post Infusion IV Care: Observation period completed  Discharge: Condition: Good, Destination: Home . AVS Provided  Performed by:  Lataunya Ruud Ragsdale, RN

## 2024-01-23 ENCOUNTER — Telehealth: Payer: Self-pay | Admitting: *Deleted

## 2024-01-23 NOTE — Telephone Encounter (Signed)
 Patient contacted the office stating she received a call from [facility] advising her we ordered a mammogram. Patient advised we have not ordered a mammogram as it is not a test we order or manage. Patient advised she may want to reach out to her PCP or GYN. Patient expressed understanding.

## 2024-01-29 DIAGNOSIS — Z23 Encounter for immunization: Secondary | ICD-10-CM | POA: Diagnosis not present

## 2024-02-12 ENCOUNTER — Encounter: Attending: Rheumatology | Admitting: *Deleted

## 2024-02-12 ENCOUNTER — Other Ambulatory Visit: Payer: Self-pay | Admitting: Emergency Medicine

## 2024-02-12 VITALS — BP 138/88 | HR 72 | Temp 97.7°F | Resp 16

## 2024-02-12 DIAGNOSIS — Z111 Encounter for screening for respiratory tuberculosis: Secondary | ICD-10-CM | POA: Insufficient documentation

## 2024-02-12 DIAGNOSIS — Z13220 Encounter for screening for lipoid disorders: Secondary | ICD-10-CM | POA: Diagnosis not present

## 2024-02-12 DIAGNOSIS — M0579 Rheumatoid arthritis with rheumatoid factor of multiple sites without organ or systems involvement: Secondary | ICD-10-CM

## 2024-02-12 DIAGNOSIS — Z79899 Other long term (current) drug therapy: Secondary | ICD-10-CM | POA: Diagnosis not present

## 2024-02-12 MED ORDER — SODIUM CHLORIDE 0.9 % IV SOLN
1000.0000 mg | Freq: Once | INTRAVENOUS | Status: AC
  Administered 2024-02-12: 1000 mg via INTRAVENOUS
  Filled 2024-02-12: qty 40

## 2024-02-12 MED ORDER — ACETAMINOPHEN 325 MG PO TABS: 650.0000 mg | ORAL_TABLET | Freq: Once | ORAL | Status: DC

## 2024-02-12 MED ORDER — DIPHENHYDRAMINE HCL 25 MG PO CAPS: 25.0000 mg | ORAL_CAPSULE | Freq: Once | ORAL | Status: DC

## 2024-02-12 NOTE — Progress Notes (Signed)
 Diagnosis: Rheumatoid Arthritis  Provider:  Dolphus Reiter MD  Procedure: IV Infusion  IV Type: Peripheral, IV Location: L Antecubital  Orencia  (Abatacept ), Dose: 1000 mg  Infusion Start Time: 0955  Infusion Stop Time: 1025  Post Infusion IV Care: Observation period completed  Discharge: Condition: Good, Destination: Home . AVS Provided  Performed by:  Baldwin Darice Helling, RN

## 2024-02-13 ENCOUNTER — Ambulatory Visit: Payer: Self-pay | Admitting: Rheumatology

## 2024-02-13 LAB — CBC WITH DIFFERENTIAL/PLATELET
Absolute Lymphocytes: 1495 {cells}/uL (ref 850–3900)
Absolute Monocytes: 485 {cells}/uL (ref 200–950)
Basophils Absolute: 20 {cells}/uL (ref 0–200)
Basophils Relative: 0.4 %
Eosinophils Absolute: 160 {cells}/uL (ref 15–500)
Eosinophils Relative: 3.2 %
HCT: 37.7 % (ref 35.0–45.0)
Hemoglobin: 12.4 g/dL (ref 11.7–15.5)
MCH: 33.1 pg — ABNORMAL HIGH (ref 27.0–33.0)
MCHC: 32.9 g/dL (ref 32.0–36.0)
MCV: 100.5 fL — ABNORMAL HIGH (ref 80.0–100.0)
MPV: 9 fL (ref 7.5–12.5)
Monocytes Relative: 9.7 %
Neutro Abs: 2840 {cells}/uL (ref 1500–7800)
Neutrophils Relative %: 56.8 %
Platelets: 280 Thousand/uL (ref 140–400)
RBC: 3.75 Million/uL — ABNORMAL LOW (ref 3.80–5.10)
RDW: 11.9 % (ref 11.0–15.0)
Total Lymphocyte: 29.9 %
WBC: 5 Thousand/uL (ref 3.8–10.8)

## 2024-02-13 LAB — COMPREHENSIVE METABOLIC PANEL WITH GFR
AG Ratio: 1.9 (calc) (ref 1.0–2.5)
ALT: 10 U/L (ref 6–29)
AST: 17 U/L (ref 10–35)
Albumin: 4.1 g/dL (ref 3.6–5.1)
Alkaline phosphatase (APISO): 44 U/L (ref 37–153)
BUN: 15 mg/dL (ref 7–25)
CO2: 29 mmol/L (ref 20–32)
Calcium: 8.9 mg/dL (ref 8.6–10.4)
Chloride: 103 mmol/L (ref 98–110)
Creat: 0.76 mg/dL (ref 0.60–0.95)
Globulin: 2.2 g/dL (ref 1.9–3.7)
Glucose, Bld: 90 mg/dL (ref 65–99)
Potassium: 3.6 mmol/L (ref 3.5–5.3)
Sodium: 139 mmol/L (ref 135–146)
Total Bilirubin: 0.5 mg/dL (ref 0.2–1.2)
Total Protein: 6.3 g/dL (ref 6.1–8.1)
eGFR: 78 mL/min/1.73m2 (ref >=60)

## 2024-02-13 NOTE — Progress Notes (Signed)
 CBC and CMP are stable.  MCV remains elevated.  Patient should take multivitamin.

## 2024-03-11 ENCOUNTER — Encounter: Attending: Rheumatology | Admitting: *Deleted

## 2024-03-11 VITALS — BP 137/87 | HR 65 | Temp 97.6°F | Resp 16

## 2024-03-11 DIAGNOSIS — Z13220 Encounter for screening for lipoid disorders: Secondary | ICD-10-CM | POA: Diagnosis present

## 2024-03-11 DIAGNOSIS — Z111 Encounter for screening for respiratory tuberculosis: Secondary | ICD-10-CM | POA: Diagnosis present

## 2024-03-11 DIAGNOSIS — Z79899 Other long term (current) drug therapy: Secondary | ICD-10-CM | POA: Insufficient documentation

## 2024-03-11 DIAGNOSIS — M0579 Rheumatoid arthritis with rheumatoid factor of multiple sites without organ or systems involvement: Secondary | ICD-10-CM | POA: Insufficient documentation

## 2024-03-11 MED ORDER — SODIUM CHLORIDE 0.9 % IV SOLN
1000.0000 mg | Freq: Once | INTRAVENOUS | Status: AC
  Administered 2024-03-11: 1000 mg via INTRAVENOUS
  Filled 2024-03-11: qty 40

## 2024-03-11 NOTE — Progress Notes (Signed)
 Diagnosis: Rheumatoid Arthritis  Provider:  Dolphus Reiter MD  Procedure: IV Infusion  IV Type: Peripheral, IV Location: L Antecubital  Orencia  (Abatacept ), Dose: 1000 mg  Infusion Start Time: 0939  Infusion Stop Time: 1010  Post Infusion IV Care: Observation period completed  Discharge: Condition: Good, Destination: Home . AVS Provided  Performed by:  Baldwin Darice Helling, RN

## 2024-03-13 ENCOUNTER — Other Ambulatory Visit: Payer: Self-pay

## 2024-03-13 MED ORDER — VENTOLIN HFA 108 (90 BASE) MCG/ACT IN AERS: INHALATION_SPRAY | RESPIRATORY_TRACT | 3 refills | Status: AC

## 2024-03-13 NOTE — Telephone Encounter (Signed)
 Fax received from [facility] for refill on Ventolin  HFA inhaler.  Per chart note from Dr. Neysa 01/08/2024:  Continue Symbicort , Spiriva , and albuterol  inhaler.  Return in about 6 months (around 07/07/2024).   Will okay refill x 4.

## 2024-03-24 ENCOUNTER — Encounter: Payer: Self-pay | Admitting: Internal Medicine

## 2024-03-27 ENCOUNTER — Other Ambulatory Visit: Payer: Self-pay | Admitting: *Deleted

## 2024-03-27 DIAGNOSIS — M0579 Rheumatoid arthritis with rheumatoid factor of multiple sites without organ or systems involvement: Secondary | ICD-10-CM

## 2024-03-27 MED ORDER — HYDROXYCHLOROQUINE SULFATE 200 MG PO TABS: ORAL_TABLET | ORAL | 0 refills | Status: AC

## 2024-03-27 NOTE — Telephone Encounter (Signed)
 Patient contacted the office and left message requesting a refill on PLQ.  Last Fill: 01/02/2024   Eye exam: 11/16/2023 WNL      Labs: 12/18/2023 CBC and CMP were normal.    Next Visit: 04/21/2024   Last Visit: 11/06/2023   IK:Myzlfjunpi arthritis with rheumatoid factor of multiple sites without organ or systems involvement    Current Dose per office note 11/06/2023: Plaquenil  200 mg in the morning and 100 mg at bedtime.    Okay to refill Plaquenil ?

## 2024-04-07 NOTE — Progress Notes (Unsigned)
 Office Visit Note  Patient: Monica [name]             Date of Birth: 1941-02-05           MRN: [id number]             PCP: Marvene Prentice SAUNDERS, FNP Referring: Marvene Prentice SAUNDERS, FNP Visit Date: 04/21/2024 Occupation: Data Unavailable  Subjective:  No chief complaint on file.   History of Present Illness: Monica [name] is a 83 y.o. female ***     Activities of Daily Living:  Patient reports morning stiffness for *** {minute/hour:19697}.   Patient {ACTIONS;DENIES/REPORTS:21021675::Denies} nocturnal pain.  Difficulty dressing/grooming: {ACTIONS;DENIES/REPORTS:21021675::Denies} Difficulty climbing stairs: {ACTIONS;DENIES/REPORTS:21021675::Denies} Difficulty getting out of chair: {ACTIONS;DENIES/REPORTS:21021675::Denies} Difficulty using hands for taps, buttons, cutlery, and/or writing: {ACTIONS;DENIES/REPORTS:21021675::Denies}  No Rheumatology ROS completed.   PMFS History:  Patient Active Problem List   Diagnosis Date Noted   Lipid screening 06/28/2023   Screening for tuberculosis 06/28/2023   Asthmatic bronchitis 02/16/2019   Osteopenia 12/20/2016   History of depression 12/11/2016   High risk medication use 05/15/2016   Primary osteoarthritis of both hands 05/15/2016   Primary osteoarthritis of both feet 05/15/2016   Primary osteoarthritis of both knees 05/15/2016   Vitamin D  deficiency 05/15/2016   History of hypertension 05/15/2016   History of hyperlipidemia 05/15/2016   Rheumatoid arthritis with rheumatoid factor of multiple sites without organ or systems involvement (HCC) 01/10/2016   Seasonal allergic rhinitis 10/03/2011   HYPERLIPIDEMIA 12/31/2008   Essential hypertension 12/31/2008   Mild persistent asthmatic bronchitis with exacerbation 12/31/2008    Past Medical History:  Diagnosis Date   Arthritis    Asthma    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, 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%   Hyperlipidemia    Hypertension     Family History  Problem Relation Age of Onset    Lung cancer Father    Alzheimer's disease Mother    Past Surgical History:  Procedure Laterality Date   CARPAL TUNNEL RELEASE     FOOT SURGERY     MOUTH SURGERY Right 05/13/2020   Social History   Tobacco Use   Smoking status: Never    Passive exposure: Never   Smokeless tobacco: Never  Vaping Use   Vaping status: Never Used  Substance Use Topics   Alcohol use: No   Drug use: No   Social History   Social History Narrative   Not on file     Immunization History  Administered Date(s) Administered   Fluad Quad(high Dose 65+) 02/13/2019, 02/18/2020, 02/17/2021   Fluad Trivalent(High Dose 65+) 02/19/2023   INFLUENZA, HIGH DOSE SEASONAL PF 02/12/2017, 02/12/2018   Influenza Nasal 02/29/2012   Influenza Split 03/02/2011   Influenza,inj,Quad PF,6+ Mos 02/25/2013, 02/23/2014   Influenza-Unspecified 03/15/2015   Moderna Sars-Covid-2 Vaccination 07/16/2019, 08/13/2019, 08/06/2020   Pneumococcal Conjugate-13 02/25/2013   Zoster Recombinant(Shingrix) 09/27/2021     Objective: Vital Signs: There were no vitals taken for this visit.   Physical Exam   Musculoskeletal Exam: ***  CDAI Exam: CDAI Score: -- Patient Global: --; Provider Global: -- Swollen: --; Tender: -- Joint Exam 04/21/2024   No joint exam has been documented for this visit   There is currently no information documented on the homunculus. Go to the Rheumatology activity and complete the homunculus joint exam.  Investigation: No additional findings.  Imaging: No results found.  Recent Labs: Lab Results  Component Value Date   WBC 5.0 02/12/2024   HGB 12.4 02/12/2024   PLT 280  02/12/2024   NA 139 02/12/2024   K 3.6 02/12/2024   CL 103 02/12/2024   CO2 29 02/12/2024   GLUCOSE 90 02/12/2024   BUN 15 02/12/2024   CREATININE 0.76 02/12/2024   BILITOT 0.5 02/12/2024   ALKPHOS 56 11/14/2022   AST 17 02/12/2024   ALT 10 02/12/2024   PROT 6.3 02/12/2024   ALBUMIN 3.9 11/14/2022   CALCIUM 8.9  02/12/2024   GFRAA >60 01/27/2020   QFTBGOLD Negative 07/24/2016   QFTBGOLDPLUS NEGATIVE 07/03/2023    Speciality Comments: PLQ eye exam: 11/16/2023 WNL  @ [facility]. Follow up in 1 year. Orencia  1000 mg IV q 28days started on 09/2016  Prior therapy includes: Simponi  Aria (inadequate response) Fosamax   January 03, 2023  Procedures:  No procedures performed Allergies: Arava [leflunomide] and Piroxicam   Assessment / Plan:     Visit Diagnoses: No diagnosis found.  Orders: No orders of the defined types were placed in this encounter.  No orders of the defined types were placed in this encounter.   Face-to-face time spent with patient was *** minutes. Greater than 50% of time was spent in counseling and coordination of care.  Follow-Up Instructions: No follow-ups on file.   Daved JAYSON Gavel, CMA  Note - This record has been created using Animal nutritionist.  Chart creation errors have been sought, but may not always  have been located. Such creation errors do not reflect on  the standard of medical care.

## 2024-04-08 ENCOUNTER — Encounter: Attending: Rheumatology | Admitting: Emergency Medicine

## 2024-04-08 ENCOUNTER — Other Ambulatory Visit: Payer: Self-pay | Admitting: Emergency Medicine

## 2024-04-08 VITALS — BP 125/80 | HR 65 | Temp 98.0°F | Resp 15

## 2024-04-08 DIAGNOSIS — M546 Pain in thoracic spine: Secondary | ICD-10-CM | POA: Diagnosis present

## 2024-04-08 DIAGNOSIS — M19041 Primary osteoarthritis, right hand: Secondary | ICD-10-CM | POA: Diagnosis present

## 2024-04-08 DIAGNOSIS — M19042 Primary osteoarthritis, left hand: Secondary | ICD-10-CM | POA: Diagnosis present

## 2024-04-08 DIAGNOSIS — M170 Bilateral primary osteoarthritis of knee: Secondary | ICD-10-CM | POA: Diagnosis present

## 2024-04-08 DIAGNOSIS — M19072 Primary osteoarthritis, left ankle and foot: Secondary | ICD-10-CM | POA: Insufficient documentation

## 2024-04-08 DIAGNOSIS — Z8709 Personal history of other diseases of the respiratory system: Secondary | ICD-10-CM | POA: Diagnosis present

## 2024-04-08 DIAGNOSIS — M0579 Rheumatoid arthritis with rheumatoid factor of multiple sites without organ or systems involvement: Secondary | ICD-10-CM | POA: Insufficient documentation

## 2024-04-08 DIAGNOSIS — E559 Vitamin D deficiency, unspecified: Secondary | ICD-10-CM | POA: Diagnosis present

## 2024-04-08 DIAGNOSIS — M7062 Trochanteric bursitis, left hip: Secondary | ICD-10-CM | POA: Insufficient documentation

## 2024-04-08 DIAGNOSIS — Z111 Encounter for screening for respiratory tuberculosis: Secondary | ICD-10-CM | POA: Insufficient documentation

## 2024-04-08 DIAGNOSIS — M810 Age-related osteoporosis without current pathological fracture: Secondary | ICD-10-CM | POA: Diagnosis present

## 2024-04-08 DIAGNOSIS — G8929 Other chronic pain: Secondary | ICD-10-CM | POA: Insufficient documentation

## 2024-04-08 DIAGNOSIS — M19071 Primary osteoarthritis, right ankle and foot: Secondary | ICD-10-CM | POA: Insufficient documentation

## 2024-04-08 DIAGNOSIS — Z79899 Other long term (current) drug therapy: Secondary | ICD-10-CM | POA: Insufficient documentation

## 2024-04-08 DIAGNOSIS — Z8659 Personal history of other mental and behavioral disorders: Secondary | ICD-10-CM | POA: Insufficient documentation

## 2024-04-08 DIAGNOSIS — Z8679 Personal history of other diseases of the circulatory system: Secondary | ICD-10-CM | POA: Diagnosis present

## 2024-04-08 DIAGNOSIS — Z13220 Encounter for screening for lipoid disorders: Secondary | ICD-10-CM | POA: Insufficient documentation

## 2024-04-08 DIAGNOSIS — Z8639 Personal history of other endocrine, nutritional and metabolic disease: Secondary | ICD-10-CM | POA: Diagnosis present

## 2024-04-08 MED ORDER — SODIUM CHLORIDE 0.9 % IV SOLN
1000.0000 mg | Freq: Once | INTRAVENOUS | Status: AC
  Administered 2024-04-08: 1000 mg via INTRAVENOUS
  Filled 2024-04-08: qty 40

## 2024-04-08 NOTE — Progress Notes (Signed)
 Diagnosis: Rheumatoid Arthritis  Provider:  Dolphus Reiter MD  Procedure: IV Infusion  IV Type: Peripheral, IV Location: L Antecubital  Orencia  (Abatacept ), Dose: 1000 mg  Infusion Start Time: 0940  Infusion Stop Time: 1010  Post Infusion IV Care: Peripheral IV Discontinued  Discharge: Condition: Good, Destination: Home . AVS Declined  Performed by:  Delon ONEIDA Officer, RN

## 2024-04-10 ENCOUNTER — Ambulatory Visit: Payer: Self-pay | Admitting: Rheumatology

## 2024-04-10 DIAGNOSIS — I700 Atherosclerosis of aorta: Secondary | ICD-10-CM | POA: Diagnosis not present

## 2024-04-10 DIAGNOSIS — Z6836 Body mass index (BMI) 36.0-36.9, adult: Secondary | ICD-10-CM | POA: Diagnosis not present

## 2024-04-10 DIAGNOSIS — F3341 Major depressive disorder, recurrent, in partial remission: Secondary | ICD-10-CM | POA: Diagnosis not present

## 2024-04-10 DIAGNOSIS — E7800 Pure hypercholesterolemia, unspecified: Secondary | ICD-10-CM | POA: Diagnosis not present

## 2024-04-10 DIAGNOSIS — M810 Age-related osteoporosis without current pathological fracture: Secondary | ICD-10-CM | POA: Diagnosis not present

## 2024-04-10 DIAGNOSIS — R7303 Prediabetes: Secondary | ICD-10-CM | POA: Diagnosis not present

## 2024-04-10 DIAGNOSIS — I10 Essential (primary) hypertension: Secondary | ICD-10-CM | POA: Diagnosis not present

## 2024-04-10 LAB — COMPREHENSIVE METABOLIC PANEL WITH GFR
ALT: 8 IU/L (ref 0–32)
AST: 17 IU/L (ref 0–40)
Albumin: 4.5 g/dL (ref 3.7–4.7)
Alkaline Phosphatase: 57 IU/L (ref 48–129)
BUN/Creatinine Ratio: 22 (ref 12–28)
BUN: 19 mg/dL (ref 8–27)
Bilirubin Total: 0.5 mg/dL (ref 0.0–1.2)
CO2: 25 mmol/L (ref 20–29)
Calcium: 10 mg/dL (ref 8.7–10.3)
Chloride: 99 mmol/L (ref 96–106)
Creatinine, Ser: 0.85 mg/dL (ref 0.57–1.00)
Globulin, Total: 2.3 g/dL (ref 1.5–4.5)
Glucose: 86 mg/dL (ref 70–99)
Potassium: 3.7 mmol/L (ref 3.5–5.2)
Sodium: 139 mmol/L (ref 134–144)
Total Protein: 6.8 g/dL (ref 6.0–8.5)
eGFR: 68 mL/min/1.73 (ref >59)

## 2024-04-10 LAB — CBC WITH DIFFERENTIAL/PLATELET
Basophils Absolute: 0 x10E3/uL (ref 0.0–0.2)
Basos: 1 %
EOS (ABSOLUTE): 0.1 x10E3/uL (ref 0.0–0.4)
Eos: 2 %
Hematocrit: 39.8 % (ref 34.0–46.6)
Hemoglobin: 13.3 g/dL (ref 11.1–15.9)
Immature Grans (Abs): 0 x10E3/uL (ref 0.0–0.1)
Immature Granulocytes: 0 %
Lymphocytes Absolute: 1.8 x10E3/uL (ref 0.7–3.1)
Lymphs: 25 %
MCH: 32.8 pg (ref 26.6–33.0)
MCHC: 33.4 g/dL (ref 31.5–35.7)
MCV: 98 fL — ABNORMAL HIGH (ref 79–97)
Monocytes Absolute: 0.6 x10E3/uL (ref 0.1–0.9)
Monocytes: 8 %
Neutrophils Absolute: 4.5 x10E3/uL (ref 1.4–7.0)
Neutrophils: 64 %
Platelets: 326 x10E3/uL (ref 150–450)
RBC: 4.05 x10E6/uL (ref 3.77–5.28)
RDW: 11.6 % — ABNORMAL LOW (ref 11.7–15.4)
WBC: 7 x10E3/uL (ref 3.4–10.8)

## 2024-04-21 ENCOUNTER — Ambulatory Visit: Admitting: Rheumatology

## 2024-04-21 ENCOUNTER — Encounter: Payer: Self-pay | Admitting: Rheumatology

## 2024-04-21 ENCOUNTER — Other Ambulatory Visit: Payer: Self-pay | Admitting: *Deleted

## 2024-04-21 VITALS — BP 122/78 | HR 72 | Temp 97.2°F | Resp 17 | Ht 61.5 in | Wt 200.4 lb

## 2024-04-21 DIAGNOSIS — E559 Vitamin D deficiency, unspecified: Secondary | ICD-10-CM | POA: Diagnosis not present

## 2024-04-21 DIAGNOSIS — M0579 Rheumatoid arthritis with rheumatoid factor of multiple sites without organ or systems involvement: Secondary | ICD-10-CM | POA: Diagnosis not present

## 2024-04-21 DIAGNOSIS — M19071 Primary osteoarthritis, right ankle and foot: Secondary | ICD-10-CM | POA: Diagnosis not present

## 2024-04-21 DIAGNOSIS — Z8709 Personal history of other diseases of the respiratory system: Secondary | ICD-10-CM

## 2024-04-21 DIAGNOSIS — Z8679 Personal history of other diseases of the circulatory system: Secondary | ICD-10-CM

## 2024-04-21 DIAGNOSIS — Z8659 Personal history of other mental and behavioral disorders: Secondary | ICD-10-CM

## 2024-04-21 DIAGNOSIS — M170 Bilateral primary osteoarthritis of knee: Secondary | ICD-10-CM

## 2024-04-21 DIAGNOSIS — M7062 Trochanteric bursitis, left hip: Secondary | ICD-10-CM | POA: Diagnosis not present

## 2024-04-21 DIAGNOSIS — M19041 Primary osteoarthritis, right hand: Secondary | ICD-10-CM

## 2024-04-21 DIAGNOSIS — M810 Age-related osteoporosis without current pathological fracture: Secondary | ICD-10-CM | POA: Diagnosis not present

## 2024-04-21 DIAGNOSIS — Z8639 Personal history of other endocrine, nutritional and metabolic disease: Secondary | ICD-10-CM

## 2024-04-21 DIAGNOSIS — M546 Pain in thoracic spine: Secondary | ICD-10-CM

## 2024-04-21 DIAGNOSIS — G8929 Other chronic pain: Secondary | ICD-10-CM

## 2024-04-21 DIAGNOSIS — M8589 Other specified disorders of bone density and structure, multiple sites: Secondary | ICD-10-CM

## 2024-04-21 DIAGNOSIS — M19042 Primary osteoarthritis, left hand: Secondary | ICD-10-CM

## 2024-04-21 DIAGNOSIS — Z79899 Other long term (current) drug therapy: Secondary | ICD-10-CM

## 2024-04-21 DIAGNOSIS — M19072 Primary osteoarthritis, left ankle and foot: Secondary | ICD-10-CM

## 2024-04-21 NOTE — Patient Instructions (Addendum)
 Standing Labs We placed an order today for your standing lab work.   Please have your standing labs drawn in March and every 3 months  Please have your labs drawn 2 weeks prior to your appointment so that the provider can discuss your lab results at your appointment, if possible.  Please note that you may see your imaging and lab results in MyChart before we have reviewed them. We will contact you once all results are reviewed. Please allow our office up to 72 hours to thoroughly review all of the results before contacting the office for clarification of your results.  WALK-IN LAB HOURS  Monday through Thursday from 8:00 am - 4:30 pm and Friday from 8:00 am-12:00 pm.  Patients with office visits requiring labs will be seen before walk-in labs.  You may encounter longer than normal wait times. Please allow additional time. Wait times may be shorter on  Monday and Thursday afternoons.  We do not book appointments for walk-in labs. We appreciate your patience and understanding with our staff.   Labs are drawn by [facility]. Please bring your co-pay at the time of your lab draw.  You may receive a bill from [facility] for your lab work.  Please note if you are on Hydroxychloroquine  and and an order has been placed for a Hydroxychloroquine  level,  you will need to have it drawn 4 hours or more after your last dose.  If you wish to have your labs drawn at another location, please call the office 24 hours in advance so we can fax the orders.  The office is located at [address], [address], [place], [place] [address]   If you have any questions regarding directions or hours of operation,  please call [phone].   As a reminder, please drink plenty of water prior to coming for your lab work. Thanks!   Vaccines You are taking a medication(s) that can suppress your immune system.  The following immunizations are recommended: Flu annually RSV Covid-19  Td/Tdap (tetanus, diphtheria, pertussis)  every 10 years Pneumonia (Prevnar 15 then Pneumovax 23 at least 1 year apart.  Alternatively, can take Prevnar 20 without needing additional dose) Shingrix: 2 doses from 4 weeks to 6 months apart  Please check with your PCP to make sure you are up to date.   If you have signs or symptoms of an infection or start antibiotics: First, call your PCP for workup of your infection. Hold your medication through the infection, until you complete your antibiotics, and until symptoms resolve if you take the following: Injectable medication (Actemra, Benlysta, Cimzia, Cosentyx, Enbrel, Humira, Kevzara, Orencia , Remicade, Simponi , Stelara, Taltz, Tremfya) Methotrexate Leflunomide (Arava) Mycophenolate (Cellcept) Earma, Olumiant, or Rinvoq  Please get an annual skin exam to screen for skin cancer while you are on Orencia . Please use sun screen and sun protection.  Thoracic Strain Rehab Ask your health care provider which exercises are safe for you. Do exercises exactly as told by your provider and adjust them as directed. It is normal to feel mild stretching, pulling, tightness, or discomfort as you do these exercises. Stop right away if you feel sudden pain or your pain gets worse. Do not begin these exercises until told by your provider. Stretching and range-of-motion exercise This exercise warms up your muscles and joints and improves the movement and flexibility of your back and shoulders. This exercise also helps to relieve pain. Chest and spine stretch  Lie down on your back on a firm surface. Roll  a towel or a small blanket so it is about 4 inches (10 cm) in diameter. Put the towel under the middle of your back so it is under your spine, but not under your shoulder blades. Put your hands behind your head and let your elbows fall to your sides. This will increase your stretch. Take a deep breath (inhale). Hold for __________ seconds. Relax after you breathe out (exhale). Repeat __________  times. Complete this exercise __________ times a day. Strengthening exercises These exercises build strength and endurance in your back and your shoulder blade muscles. Endurance is the ability to use your muscles for a long time, even after they get tired. Alternating arm and leg raises  Get on your hands and knees on a firm surface. If you are on a hard floor, you may want to use padding, such as an exercise mat, to cushion your knees. Line up your arms and legs. Your hands should be directly below your shoulders, and your knees should be directly below your hips. Lift your left leg behind you. At the same time, raise your right arm and straighten it in front of you. Do not lift your leg higher than your hip. Do not lift your arm higher than your shoulder. Keep your abdominal and back muscles tight. Keep your hips facing the ground. Do not arch your back. Carefully stay balanced. Do not hold your breath. Hold for __________ seconds. Slowly return to the starting position and repeat with your right leg and your left arm. Repeat __________ times. Complete this exercise __________ times a day. Straight arm rows This exercise is also called the shoulder extension exercise. Stand with your feet shoulder width apart. Secure an exercise band to a stable object in front of you so the band is at or above shoulder height. Hold one end of the exercise band in each hand. Straighten your elbows and lift your hands up to shoulder height. Step back, away from the secured end of the exercise band, until the band stretches. Squeeze your shoulder blades together and pull your hands down to the sides of your thighs. Stop when your hands are straight down by your sides. This is shoulder extension. Do not let your hands go behind your body. Hold for __________ seconds. Slowly return to the starting position. Repeat __________ times. Complete this exercise __________ times a day. Rowing scapular  retraction This is an exercise in which the shoulder blades (scapulae) are pulled toward each other (retraction). Sit in a stable chair without armrests, or stand up. Secure an exercise band to a stable object in front of you so the band is at shoulder height. Hold one end of the exercise band in each hand. Your palms should face toward each other. Bring your arms out straight in front of you. Step back, away from the secured end of the exercise band, until the band stretches. Pull the band backward. As you do this, bend your elbows and squeeze your shoulder blades together, but avoid letting the rest of your body move. Do not shrug your shoulders upward while you do this. Stop when your elbows are at your sides or slightly behind your body. Hold for __________ seconds. Slowly straighten your arms to return to the starting position. Repeat __________ times. Complete this exercise __________ times a day. Posture and body mechanics Good posture and healthy body mechanics can help to relieve stress in your body's tissues and joints. Body mechanics refers to the movements and positions of your  body while you do your daily activities. Posture is part of body mechanics. Good posture means: Your spine is in its natural S-curve position (neutral). Your shoulders are pulled back slightly. Your head is not tipped forward. Follow these guidelines to improve your posture and body mechanics in your everyday activities. Standing  When standing, keep your spine neutral and your feet about hip width apart. Keep a slight bend in your knees. Your ears, shoulders, and hips should line up with each other. When you do a task in which you lean forward while standing in one place for a long time, place one foot up on a stable object that is 2-4 inches (5-10 cm) high, such as a footstool. This helps keep your spine neutral. Sitting  When sitting, keep your spine neutral and keep your feet flat on the floor. Use a  footrest if needed. Keep your thighs parallel to the floor. Avoid rounding your shoulders, and avoid tilting your head forward. When working at a desk or a computer, keep your desk at a height where your hands are slightly lower than your elbows. Slide your chair under your desk so you are close enough to maintain good posture. When working at a computer, place your monitor at a height where you are looking straight ahead and you do not have to tilt your head forward or downward to look at the screen. Resting When lying down and resting, avoid positions that are most painful for you. If you have pain with activities such as sitting, bending, stooping, or squatting (flexion-basedactivities), lie in a position in which your body does not bend very much. For example, avoid curling up on your side with your arms and knees near your chest (fetal position). If you have pain with activities such as standing for a long time or reaching with your arms (extension-basedactivities), lie with your spine in a neutral position and bend your knees slightly. Try the following positions: Lie on your side with a pillow between your knees. Lie on your back with a pillow under your knees.  Lifting  When lifting objects, keep your feet at least shoulder width apart and tighten your abdominal muscles. Bend your knees and hips and keep your spine neutral. It is important to lift using the strength of your legs, not your back. Do not lock your knees straight out. Always ask for help to lift heavy or awkward objects. This information is not intended to replace advice given to you by your health care provider. Make sure you discuss any questions you have with your health care provider. Document Revised: 10/06/2022 Document Reviewed: 12/12/2021 Elsevier Patient Education  2024 [employer].

## 2024-05-06 ENCOUNTER — Encounter: Admitting: *Deleted

## 2024-05-06 VITALS — BP 130/80 | HR 61 | Temp 98.0°F | Resp 16

## 2024-05-06 DIAGNOSIS — M0579 Rheumatoid arthritis with rheumatoid factor of multiple sites without organ or systems involvement: Secondary | ICD-10-CM

## 2024-05-06 DIAGNOSIS — Z79899 Other long term (current) drug therapy: Secondary | ICD-10-CM | POA: Diagnosis not present

## 2024-05-06 DIAGNOSIS — Z111 Encounter for screening for respiratory tuberculosis: Secondary | ICD-10-CM

## 2024-05-06 DIAGNOSIS — Z13220 Encounter for screening for lipoid disorders: Secondary | ICD-10-CM

## 2024-05-06 MED ORDER — SODIUM CHLORIDE 0.9 % IV SOLN
1000.0000 mg | Freq: Once | INTRAVENOUS | Status: AC
  Administered 2024-05-06: 1000 mg via INTRAVENOUS
  Filled 2024-05-06: qty 40

## 2024-05-06 NOTE — Progress Notes (Signed)
 Diagnosis: Rheumatoid Arthritis  Provider:  Dolphus Reiter MD  Procedure: IV Infusion  IV Type: Peripheral, IV Location: L Antecubital  Orencia  (Abatacept ), Dose: 1000 mg  Infusion Start Time: 1023  Infusion Stop Time: 1053  Post Infusion IV Care: Peripheral IV Discontinued  Discharge: Condition: Good, Destination: Home . AVS Declined  Performed by:  Baldwin Darice Helling, RN

## 2024-05-21 ENCOUNTER — Encounter: Payer: Self-pay | Admitting: Pulmonary Disease

## 2024-05-21 ENCOUNTER — Ambulatory Visit: Admitting: Pulmonary Disease

## 2024-05-21 ENCOUNTER — Telehealth: Payer: Self-pay

## 2024-05-21 VITALS — BP 132/95 | HR 88 | Ht 61.5 in | Wt 201.0 lb

## 2024-05-21 DIAGNOSIS — J4540 Moderate persistent asthma, uncomplicated: Secondary | ICD-10-CM

## 2024-05-21 MED ORDER — MOMETASONE FURO-FORMOTEROL FUM 200-5 MCG/ACT IN AERO: 2.0000 | INHALATION_SPRAY | Freq: Two times a day (BID) | RESPIRATORY_TRACT | 0 refills | Status: DC

## 2024-05-21 NOTE — Progress Notes (Signed)
 "  Established Patient Pulmonology Office Visit   Subjective:  Patient ID: Monica [name], female    DOB: 1940-06-20  MRN: [id number]  CC:  Chief Complaint  Patient presents with   Medical Management of Chronic Issues    TOC - young PT states having issues filling SYMBICORT  / do a tier exception ?    Discussed the use of AI scribe software for clinical note transcription with the patient, who gave verbal consent to proceed.  History of Present Illness Monica [name] is an 84 year old female with asthma who returns for follow up.  She currently uses Symbicort  and Spiriva . She previously used Trelegy, which controlled her asthma but caused marked hoarseness described as laryngitis. Symptoms resolved within a week of stopping Trelegy. She notes Symbicort  has become substantially more expensive for her after insurance changes, which may affect affordability.  Over the last two months, particularly in December and January, she has needed her albuterol  inhaler more often for shortness of breath, which she associates with cold, windy, and rainy weather. She usually can go months without needing albuterol .  She has rheumatoid arthritis treated with infusions every 28 days and asks about injectable options for asthma therapy, indicating she is comfortable with self-injection.  She has a family history of lung cancer in her father, who died after recurrence despite radiation.  She received an influenza vaccine at a local pharmacy and is interested in RSV vaccination given her asthma and immunocompromised state.        ROS   Current Medications[1]      Objective:  BP (!) 132/95   Pulse 88   Ht 5' 1.5 (1.562 m) Comment: per pt  Wt 201 lb (91.2 kg)   SpO2 97%   BMI 37.36 kg/m     Physical Exam Constitutional:      General: She is not in acute distress.    Appearance: Normal appearance.  Eyes:     General: No scleral icterus.    Conjunctiva/sclera: Conjunctivae normal.   Cardiovascular:     Rate and Rhythm: Normal rate and regular rhythm.  Pulmonary:     Breath sounds: No wheezing, rhonchi or rales.  Musculoskeletal:     Right lower leg: No edema.     Left lower leg: No edema.  Skin:    General: Skin is warm and dry.  Neurological:     General: No focal deficit present.      Diagnostic Review:  Last CBC Lab Results  Component Value Date   WBC 7.0 04/08/2024   HGB 13.3 04/08/2024   HCT 39.8 04/08/2024   MCV 98 (H) 04/08/2024   MCH 32.8 04/08/2024   RDW 11.6 (L) 04/08/2024   PLT 326 04/08/2024   Last metabolic panel Lab Results  Component Value Date   GLUCOSE 86 04/08/2024   NA 139 04/08/2024   K 3.7 04/08/2024   CL 99 04/08/2024   CO2 25 04/08/2024   BUN 19 04/08/2024   CREATININE 0.85 04/08/2024   EGFR 68 04/08/2024   CALCIUM 10.0 04/08/2024   PHOS 3.5 01/09/2023   PROT 6.8 04/08/2024   ALBUMIN 4.5 04/08/2024   LABGLOB 2.3 04/08/2024   BILITOT 0.5 04/08/2024   ALKPHOS 57 04/08/2024   AST 17 04/08/2024   ALT 8 04/08/2024   ANIONGAP 10 11/14/2022       Assessment & Plan:   Assessment & Plan Moderate persistent asthmatic bronchitis without complication  Orders:   mometasone -formoterol  (DULERA) 200-5 MCG/ACT AERO;  Inhale 2 puffs into the lungs 2 (two) times daily.   Assessment and Plan Assessment & Plan Moderate persistent asthma Increased rescue inhaler use likely due to weather. Trelegy caused hoarseness. Symbicort  and Spiriva  effective but Symbicort  cost rising. Dulera considered as alternative. Discussed injectable medications if needed. Recommended RSV vaccine due to high risk. - Sent prescription for [facility] to pharmacy and checked cost. - Advised her to contact pharmacy to verify [facility] cost compared to Symbicort . - Will consider injectable medications if rescue inhaler use remains high. - Recommended RSV vaccine at local pharmacy. - Advised course of steroids if RSV infection occurs, with possible antibiotics  for secondary bacterial infection.      No follow-ups on file.   Dorn KATHEE Chill, MD     [1]  Current Outpatient Medications:    Abatacept  (ORENCIA  IV), Inject 1,000 mg into the vein every 28 (twenty-eight) days. Last order placed on 10/30/19 for 2 doses., Disp: , Rfl:    acetaminophen  (TYLENOL ) 500 MG tablet, Take 1,000 mg by mouth every 8 (eight) hours., Disp: , Rfl:    alendronate  (FOSAMAX ) 70 MG tablet, Take 1 tablet (70 mg total) by mouth once a week. Take with a full glass of water on an empty stomach., Disp: 12 tablet, Rfl: 0   cholecalciferol (VITAMIN D ) 1000 UNITS tablet, Take 2,000 Units by mouth daily., Disp: , Rfl:    diclofenac  Sodium (VOLTAREN ) 1 % GEL, Apply 2-4 grams to affected joint up to 4 times daily, Disp: 400 g, Rfl: 2   diphenhydrAMINE  (BENADRYL ) 25 MG tablet, Take 25 mg by mouth as needed (Prior to Orencia  infusions)., Disp: , Rfl:    furosemide (LASIX) 20 MG tablet, Take 20 mg by mouth daily as needed., Disp: , Rfl:    hydrochlorothiazide (HYDRODIURIL) 25 MG tablet, Take 25 mg by mouth daily., Disp: , Rfl:    hydroxychloroquine  (PLAQUENIL ) 200 MG tablet, TAKE 1 TABLET BY MOUTH IN THE MORNING AND 1/2 TABLET AT BEDTIME., Disp: 135 tablet, Rfl: 0   ibuprofen (ADVIL,MOTRIN) 200 MG tablet, Take 200 mg by mouth daily. , Disp: , Rfl:    lovastatin (MEVACOR) 20 MG tablet, Take 20 mg by mouth at bedtime., Disp: , Rfl:    mometasone -formoterol  (DULERA) 200-5 MCG/ACT AERO, Inhale 2 puffs into the lungs 2 (two) times daily., Disp: 1 each, Rfl: 0   sertraline (ZOLOFT) 100 MG tablet, Take 100 mg by mouth daily., Disp: , Rfl:    Tiotropium Bromide  Monohydrate (SPIRIVA  RESPIMAT) 1.25 MCG/ACT AERS, Inhale 2 puffs daily, Disp: 4 g, Rfl: 12   VENTOLIN  HFA 108 (90 Base) MCG/ACT inhaler, INHALE 1-2 PUFFS EVERY 4 HOURS AS NEEDED FOR SHORTNESS OF BREATH/WHEEZING., Disp: 18 g, Rfl: 3  "

## 2024-05-21 NOTE — Assessment & Plan Note (Addendum)
  Orders:   mometasone -formoterol  (DULERA) 200-5 MCG/ACT AERO; Inhale 2 puffs into the lungs 2 (two) times daily.

## 2024-05-21 NOTE — Patient Instructions (Signed)
 Try dulera 200-5mcg 2 puffs twice daily - rinse mouth out after each use  Stop Symbicort  inhaler once you start the dulera  Continue spiriva  inhaler 2 puffs daily  Use albuterol  inhaler 1-2 puffs every 4-6 hours as needed  Follow up in 6 months, call sooner if needed

## 2024-05-22 ENCOUNTER — Other Ambulatory Visit (HOSPITAL_COMMUNITY): Payer: Self-pay

## 2024-05-22 ENCOUNTER — Encounter: Payer: Self-pay | Admitting: Rheumatology

## 2024-05-23 NOTE — Telephone Encounter (Signed)
 Spoke with patient [facility], Rx new  INH will try that and call if she would like to restart INH

## 2024-05-30 ENCOUNTER — Telehealth: Payer: Self-pay

## 2024-05-30 NOTE — Telephone Encounter (Signed)
 Auth Submission: NO AUTH NEEDED Site of care: Site of care: [facility] Payer: medicare a/b, bcbs supp Medication & CPT/J Code(s) submitted: Orencia  (Abatacept ) G9870 Route of submission (phone, fax, portal):  Phone # Fax # Auth type: Buy/Bill HB Units/visits requested: 1000mg , q4weeks Reference number:  Approval from: 05/30/24 to 05/07/25

## 2024-06-03 ENCOUNTER — Other Ambulatory Visit: Payer: Self-pay | Admitting: Emergency Medicine

## 2024-06-03 ENCOUNTER — Encounter: Attending: Rheumatology | Admitting: *Deleted

## 2024-06-03 VITALS — BP 162/93 | HR 59 | Temp 97.7°F

## 2024-06-03 DIAGNOSIS — Z79899 Other long term (current) drug therapy: Secondary | ICD-10-CM | POA: Insufficient documentation

## 2024-06-03 DIAGNOSIS — Z13220 Encounter for screening for lipoid disorders: Secondary | ICD-10-CM | POA: Diagnosis present

## 2024-06-03 DIAGNOSIS — Z111 Encounter for screening for respiratory tuberculosis: Secondary | ICD-10-CM | POA: Insufficient documentation

## 2024-06-03 DIAGNOSIS — M0579 Rheumatoid arthritis with rheumatoid factor of multiple sites without organ or systems involvement: Secondary | ICD-10-CM

## 2024-06-03 MED ORDER — SODIUM CHLORIDE 0.9 % IV SOLN
1000.0000 mg | Freq: Once | INTRAVENOUS | Status: AC
  Administered 2024-06-03: 1000 mg via INTRAVENOUS
  Filled 2024-06-03: qty 40

## 2024-06-03 NOTE — Progress Notes (Signed)
 Diagnosis: Rheumatoid Arthritis  Provider:  Dolphus Reiter MD  Procedure: IV Infusion  IV Type: Peripheral, IV Location: L Antecubital  Orencia  (Abatacept ), Dose: 1000 mg  Infusion Start Time: 1318  Infusion Stop Time: 1355  Post Infusion IV Care: Peripheral IV Discontinued  Discharge: Condition: Good, Destination: Home . AVS Declined  Performed by:  Baldwin Darice Helling, RN

## 2024-06-04 ENCOUNTER — Telehealth: Payer: Self-pay

## 2024-06-04 ENCOUNTER — Other Ambulatory Visit: Payer: Self-pay

## 2024-06-04 DIAGNOSIS — J4540 Moderate persistent asthma, uncomplicated: Secondary | ICD-10-CM

## 2024-06-04 MED ORDER — MOMETASONE FURO-FORMOTEROL FUM 200-5 MCG/ACT IN AERO: 2.0000 | INHALATION_SPRAY | Freq: Two times a day (BID) | RESPIRATORY_TRACT | 6 refills | Status: AC

## 2024-06-04 NOTE — Telephone Encounter (Signed)
 Copied from CRM #[id number]. Topic: Clinical - Medication Refill >> Jun 04, 2024 10:51 AM Joesph PARAS wrote: Medication: mometasone -formoterol  (DULERA) 200-5 MCG/ACT AERO  Has the patient contacted their pharmacy? Yes - Pharmacy Calling  This is the patient's preferred pharmacy:  [facility] [place], [place] - [id number] [place] [address] Phone: [phone] Fax: [phone]  Is this the correct pharmacy for this prescription? Yes If no, delete pharmacy and type the correct one.   Has the prescription been filled recently? Yes  Is the patient out of the medication? No  Has the patient been seen for an appointment in the last year OR does the patient have an upcoming appointment? Yes  Can we respond through MyChart? Yes  Agent: Please be advised that Rx refills may take up to 3 business days. We ask that you follow-up with your pharmacy.  Note: Pharmacist calling for continuity purposes, requesting we send refill for next month, as patient will need it.   Rx has been sent to pharmacy

## 2024-06-05 ENCOUNTER — Ambulatory Visit: Payer: Self-pay | Admitting: Physician Assistant

## 2024-06-05 LAB — CBC WITH DIFFERENTIAL/PLATELET
Basophils Absolute: 0 10*3/uL (ref 0.0–0.2)
Basos: 1 %
EOS (ABSOLUTE): 0.1 10*3/uL (ref 0.0–0.4)
Eos: 2 %
Hematocrit: 37.2 % (ref 34.0–46.6)
Hemoglobin: 12.8 g/dL (ref 11.1–15.9)
Immature Grans (Abs): 0 10*3/uL (ref 0.0–0.1)
Immature Granulocytes: 0 %
Lymphocytes Absolute: 1.7 10*3/uL (ref 0.7–3.1)
Lymphs: 26 %
MCH: 34 pg — ABNORMAL HIGH (ref 26.6–33.0)
MCHC: 34.4 g/dL (ref 31.5–35.7)
MCV: 99 fL — ABNORMAL HIGH (ref 79–97)
Monocytes Absolute: 0.4 10*3/uL (ref 0.1–0.9)
Monocytes: 7 %
Neutrophils Absolute: 4.1 10*3/uL (ref 1.4–7.0)
Neutrophils: 64 %
Platelets: 325 10*3/uL (ref 150–450)
RBC: 3.77 x10E6/uL (ref 3.77–5.28)
RDW: 11.8 % (ref 11.7–15.4)
WBC: 6.3 10*3/uL (ref 3.4–10.8)

## 2024-06-05 LAB — COMPREHENSIVE METABOLIC PANEL WITH GFR
ALT: 11 [IU]/L (ref 0–32)
AST: 16 [IU]/L (ref 0–40)
Albumin: 4.3 g/dL (ref 3.7–4.7)
Alkaline Phosphatase: 54 [IU]/L (ref 48–129)
BUN/Creatinine Ratio: 15 (ref 12–28)
BUN: 12 mg/dL (ref 8–27)
Bilirubin Total: 0.4 mg/dL (ref 0.0–1.2)
CO2: 23 mmol/L (ref 20–29)
Calcium: 9.6 mg/dL (ref 8.7–10.3)
Chloride: 101 mmol/L (ref 96–106)
Creatinine, Ser: 0.8 mg/dL (ref 0.57–1.00)
Globulin, Total: 2.2 g/dL (ref 1.5–4.5)
Glucose: 97 mg/dL (ref 70–99)
Potassium: 3.8 mmol/L (ref 3.5–5.2)
Sodium: 140 mmol/L (ref 134–144)
Total Protein: 6.5 g/dL (ref 6.0–8.5)
eGFR: 73 mL/min/{1.73_m2}

## 2024-06-05 LAB — QUANTIFERON-TB GOLD PLUS
QuantiFERON Mitogen Value: >10 [IU]/mL
QuantiFERON Nil Value: 0.1 [IU]/mL
QuantiFERON TB1 Ag Value: 0.14 [IU]/mL
QuantiFERON TB2 Ag Value: 0.18 [IU]/mL
QuantiFERON-TB Gold Plus: NEGATIVE

## 2024-06-05 LAB — LIPID PANEL
Chol/HDL Ratio: 2.9 ratio (ref 0.0–4.4)
Cholesterol, Total: 140 mg/dL (ref 100–199)
HDL: 49 mg/dL
LDL Chol Calc (NIH): 76 mg/dL (ref 0–99)
Triglycerides: 73 mg/dL (ref 0–149)
VLDL Cholesterol Cal: 15 mg/dL (ref 5–40)

## 2024-06-05 LAB — SPECIMEN STATUS REPORT

## 2024-07-01 ENCOUNTER — Ambulatory Visit

## 2024-07-29 ENCOUNTER — Ambulatory Visit

## 2024-08-26 ENCOUNTER — Ambulatory Visit

## 2024-09-23 ENCOUNTER — Ambulatory Visit

## 2024-09-26 ENCOUNTER — Encounter: Attending: Rheumatology | Admitting: Rheumatology

## 2024-10-21 ENCOUNTER — Ambulatory Visit

## 2024-11-04 ENCOUNTER — Ambulatory Visit: Admitting: Pulmonary Disease
